# Patient Record
Sex: Female | Born: 1985 | Race: Black or African American | Hispanic: No | Marital: Single | Smoking: Current every day smoker
Health system: Southern US, Community
[De-identification: ages and names within clinical notes are randomized; demographics above are authoritative.]

## PROBLEM LIST (undated history)

## (undated) ENCOUNTER — Inpatient Hospital Stay: Payer: Self-pay

## (undated) ENCOUNTER — Inpatient Hospital Stay: Payer: Self-pay | Admitting: Obstetrics and Gynecology

## (undated) DIAGNOSIS — F1910 Other psychoactive substance abuse, uncomplicated: Secondary | ICD-10-CM

## (undated) DIAGNOSIS — Z62810 Personal history of physical and sexual abuse in childhood: Secondary | ICD-10-CM

## (undated) DIAGNOSIS — R87619 Unspecified abnormal cytological findings in specimens from cervix uteri: Secondary | ICD-10-CM

## (undated) DIAGNOSIS — N390 Urinary tract infection, site not specified: Secondary | ICD-10-CM

## (undated) DIAGNOSIS — F323 Major depressive disorder, single episode, severe with psychotic features: Secondary | ICD-10-CM

## (undated) DIAGNOSIS — F419 Anxiety disorder, unspecified: Secondary | ICD-10-CM

## (undated) DIAGNOSIS — F5083 Pica in adults: Secondary | ICD-10-CM

## (undated) DIAGNOSIS — D649 Anemia, unspecified: Secondary | ICD-10-CM

## (undated) DIAGNOSIS — F5089 Other specified eating disorder: Secondary | ICD-10-CM

## (undated) DIAGNOSIS — R440 Auditory hallucinations: Secondary | ICD-10-CM

## (undated) HISTORY — PX: ABDOMINAL SURGERY: SHX537

## (undated) HISTORY — DX: Personal history of physical and sexual abuse in childhood: Z62.810

---
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
## 2006-01-01 ENCOUNTER — Observation Stay: Payer: Self-pay | Admitting: Obstetrics and Gynecology

## 2006-01-02 ENCOUNTER — Emergency Department: Payer: Self-pay | Admitting: Emergency Medicine

## 2006-04-22 ENCOUNTER — Emergency Department: Payer: Self-pay | Admitting: Emergency Medicine

## 2006-05-06 ENCOUNTER — Emergency Department: Payer: Self-pay | Admitting: Emergency Medicine

## 2006-08-08 ENCOUNTER — Emergency Department: Payer: Self-pay | Admitting: Emergency Medicine

## 2006-08-08 ENCOUNTER — Other Ambulatory Visit: Payer: Self-pay

## 2006-12-29 ENCOUNTER — Ambulatory Visit: Payer: Self-pay | Admitting: Family Medicine

## 2007-03-10 ENCOUNTER — Emergency Department: Payer: Self-pay | Admitting: Emergency Medicine

## 2007-05-27 ENCOUNTER — Emergency Department: Payer: Self-pay | Admitting: Emergency Medicine

## 2007-07-16 ENCOUNTER — Emergency Department: Payer: Self-pay | Admitting: Emergency Medicine

## 2007-07-22 ENCOUNTER — Emergency Department: Payer: Self-pay | Admitting: Emergency Medicine

## 2007-08-04 ENCOUNTER — Emergency Department: Payer: Self-pay | Admitting: Emergency Medicine

## 2007-12-23 ENCOUNTER — Emergency Department: Payer: Self-pay | Admitting: Emergency Medicine

## 2008-01-15 ENCOUNTER — Emergency Department: Payer: Self-pay | Admitting: Emergency Medicine

## 2008-01-21 ENCOUNTER — Emergency Department: Payer: Self-pay | Admitting: Emergency Medicine

## 2008-03-20 ENCOUNTER — Emergency Department: Payer: Self-pay | Admitting: Emergency Medicine

## 2008-03-21 ENCOUNTER — Emergency Department: Payer: Self-pay | Admitting: Emergency Medicine

## 2008-04-20 ENCOUNTER — Encounter: Payer: Self-pay | Admitting: Maternal & Fetal Medicine

## 2008-05-09 ENCOUNTER — Encounter: Payer: Self-pay | Admitting: Pediatric Cardiology

## 2008-05-25 ENCOUNTER — Observation Stay: Payer: Self-pay | Admitting: Obstetrics and Gynecology

## 2008-06-05 ENCOUNTER — Observation Stay: Payer: Self-pay | Admitting: Obstetrics and Gynecology

## 2008-06-14 ENCOUNTER — Observation Stay: Payer: Self-pay | Admitting: Obstetrics and Gynecology

## 2008-06-20 ENCOUNTER — Observation Stay: Payer: Self-pay | Admitting: Obstetrics and Gynecology

## 2008-06-29 ENCOUNTER — Encounter: Payer: Self-pay | Admitting: Obstetrics and Gynecology

## 2008-07-13 ENCOUNTER — Observation Stay: Payer: Self-pay

## 2008-08-10 ENCOUNTER — Observation Stay: Payer: Self-pay

## 2008-08-25 ENCOUNTER — Observation Stay: Payer: Self-pay

## 2008-08-31 ENCOUNTER — Observation Stay: Payer: Self-pay | Admitting: Obstetrics and Gynecology

## 2008-09-02 ENCOUNTER — Emergency Department: Payer: Self-pay | Admitting: Emergency Medicine

## 2008-09-03 ENCOUNTER — Observation Stay: Payer: Self-pay | Admitting: Obstetrics and Gynecology

## 2008-09-24 ENCOUNTER — Emergency Department: Payer: Self-pay | Admitting: Internal Medicine

## 2009-01-01 ENCOUNTER — Emergency Department: Payer: Self-pay | Admitting: Unknown Physician Specialty

## 2009-01-13 ENCOUNTER — Emergency Department: Payer: Self-pay | Admitting: Emergency Medicine

## 2009-05-05 ENCOUNTER — Emergency Department: Payer: Self-pay | Admitting: Internal Medicine

## 2009-07-17 ENCOUNTER — Emergency Department: Payer: Self-pay | Admitting: Emergency Medicine

## 2009-07-22 ENCOUNTER — Emergency Department: Payer: Self-pay | Admitting: Internal Medicine

## 2010-04-08 ENCOUNTER — Inpatient Hospital Stay: Payer: Self-pay | Admitting: Unknown Physician Specialty

## 2010-05-05 DIAGNOSIS — R440 Auditory hallucinations: Secondary | ICD-10-CM

## 2010-05-05 DIAGNOSIS — F323 Major depressive disorder, single episode, severe with psychotic features: Secondary | ICD-10-CM

## 2010-05-05 HISTORY — DX: Major depressive disorder, single episode, severe with psychotic features: F32.3

## 2010-05-05 HISTORY — DX: Auditory hallucinations: R44.0

## 2010-08-06 ENCOUNTER — Emergency Department: Payer: Self-pay | Admitting: Unknown Physician Specialty

## 2010-09-18 ENCOUNTER — Emergency Department: Payer: Self-pay | Admitting: Emergency Medicine

## 2010-09-24 ENCOUNTER — Emergency Department: Payer: Self-pay | Admitting: Unknown Physician Specialty

## 2010-12-12 ENCOUNTER — Emergency Department: Payer: Self-pay | Admitting: Emergency Medicine

## 2011-01-13 ENCOUNTER — Emergency Department (HOSPITAL_COMMUNITY)
Admission: EM | Admit: 2011-01-13 | Discharge: 2011-01-13 | Disposition: A | Discharge status: Home / Self Care | Payer: Self-pay | Attending: Emergency Medicine | Admitting: Emergency Medicine

## 2011-01-13 ENCOUNTER — Encounter: Payer: Self-pay | Admitting: *Deleted

## 2011-01-13 ENCOUNTER — Emergency Department (HOSPITAL_COMMUNITY): Payer: Self-pay

## 2011-01-13 DIAGNOSIS — J069 Acute upper respiratory infection, unspecified: Secondary | ICD-10-CM | POA: Insufficient documentation

## 2011-01-13 DIAGNOSIS — Z331 Pregnant state, incidental: Secondary | ICD-10-CM | POA: Insufficient documentation

## 2011-01-13 MED ORDER — FOLIC ACID 1 MG PO TABS: 1.0000 mg | ORAL_TABLET | Freq: Every day | ORAL | Status: DC

## 2011-01-13 NOTE — ED Provider Notes (Signed)
Scribed for Ward Givens, MD, the patient was seen in room APA01/APA01 . This chart was scribed by Ellie Lunch. This patient's care was started at 21:22 PM.   CSN: [id number] Arrival date & time: 01/13/2011  8:09 PM  Chief Complaint  Patient presents with  . Fever   HPI [name] Young is a 25 y.o. female who presents to the Emergency Department complaining of a cold. Pt reports a nonproductive cough, congestion with green mucus discharge, and fever starting yesterday. Pt c/o associated nausea, SOB in supine position, central chest pain and sore throat when she coughs. Denies v/d. Pt denies sick contacts. Pt has not taken any medication for sx.   No pcp. No reported chronic conditions.  History reviewed. No pertinent past medical history.  Past Surgical History  Procedure Date  . Abdominal surgery     No family history on file.  History  Substance Use Topics  . Smoking status: Not on file  . Smokeless tobacco: Not on file  . Alcohol Use:      occassional   smoker and occassional drinking. Does not work.   Review of Systems  Constitutional: Positive for fever.  HENT: Positive for congestion and sore throat.   Respiratory: Positive for cough and shortness of breath (in supine positon). Negative for wheezing.   Cardiovascular: Positive for chest pain (when she coughs).  Gastrointestinal: Positive for nausea. Negative for vomiting and diarrhea.  All other systems reviewed and are negative.    Physical Exam  BP 120/74  Pulse 96  Temp(Src) 100.2 F (37.9 C) (Oral)  Resp 20  Ht 5' (1.524 m)  Wt 146 lb 3 oz (66.31 kg)  BMI 28.55 kg/m2  SpO2 100%  Physical Exam  Constitutional: She is oriented to person, place, and time. She appears well-developed and well-nourished.  HENT:  Head: Normocephalic and atraumatic.  Nose: Mucosal edema and rhinorrhea present.       Sounds congested  Eyes: Conjunctivae and EOM are normal. Pupils are equal, round, and reactive to light.    Neck: Normal range of motion. Neck supple.  Cardiovascular: Normal rate, regular rhythm and normal heart sounds.   Pulmonary/Chest: Breath sounds normal. No respiratory distress. She has no wheezes. She has no rales. She exhibits no tenderness.  Abdominal: Soft.  Musculoskeletal: Normal range of motion.  Neurological: She is alert and oriented to person, place, and time.  Skin: Skin is warm and dry.  Psychiatric: She has a normal mood and affect.   Procedures  OTHER DATA REVIEWED: Nursing notes, vital signs, and past medical records reviewed.  DIAGNOSTIC STUDIES: Oxygen Saturation is 100% on room air, normal by my interpretation.    LABS / RADIOLOGY:  Results for orders placed during the hospital encounter of 01/13/11  POCT PREGNANCY, URINE      Component Value Range   Preg Test, Ur NEGATIVE    PREGNANCY, URINE      Component Value Range   Preg Test, Ur POSITIVE     Dg Chest 2 View  01/13/2011  *RADIOLOGY REPORT*  Clinical Data: Cough and congestion.  CHEST - 2 VIEW  Comparison: None.  Findings: The lungs are well-aerated and clear.  There is no evidence of focal opacification, pleural effusion or pneumothorax.  The heart is normal in size; the mediastinal contour is within normal limits.  No acute osseous abnormalities are seen.  IMPRESSION: No acute cardiopulmonary process seen.  Original Report Authenticated By: Tonia Ghent, M.D.   ED COURSE / COORDINATION  OF CARE: Pt coming into the doctors office stating her ride is here and she needs to leave.    MDM:   MEDICATIONS GIVEN IN THE E.D. none  I personally performed the services described in this documentation, which was scribed in my presence. The recorded information has been reviewed and considered. Devoria Albe, MD, Armando Gang        Ward Givens, MD 01/14/11 [phone]

## 2011-01-13 NOTE — ED Notes (Signed)
Fever, nausea and vomiting. Thinks she may be pregant

## 2011-01-13 NOTE — ED Notes (Signed)
*  Correction in POCT Urine Pregnancy*  TEST POSITIVE URINE BEING SENT TO LAB FOR PREGNANCY CONFIRMATION.

## 2011-01-14 ENCOUNTER — Encounter (HOSPITAL_COMMUNITY): Payer: Self-pay | Admitting: Emergency Medicine

## 2011-01-14 ENCOUNTER — Emergency Department (HOSPITAL_COMMUNITY)
Admission: EM | Admit: 2011-01-14 | Discharge: 2011-01-14 | Discharge status: Against Medical Advice | Payer: Self-pay | Attending: Emergency Medicine | Admitting: Emergency Medicine

## 2011-01-14 DIAGNOSIS — R109 Unspecified abdominal pain: Secondary | ICD-10-CM | POA: Insufficient documentation

## 2011-01-14 DIAGNOSIS — Z5320 Procedure and treatment not carried out because of patient's decision for unspecified reasons: Secondary | ICD-10-CM | POA: Insufficient documentation

## 2011-01-14 NOTE — ED Notes (Signed)
Pt c/o abd cramping with n/hematuria/dysuria. Denies v/d.

## 2011-03-24 ENCOUNTER — Inpatient Hospital Stay: Payer: Self-pay | Admitting: Psychiatry

## 2011-04-07 ENCOUNTER — Emergency Department: Payer: Self-pay | Admitting: Emergency Medicine

## 2011-04-22 ENCOUNTER — Encounter (HOSPITAL_COMMUNITY): Payer: Self-pay | Admitting: *Deleted

## 2011-04-22 ENCOUNTER — Emergency Department (HOSPITAL_COMMUNITY): Payer: Medicaid Other

## 2011-04-22 ENCOUNTER — Emergency Department (HOSPITAL_COMMUNITY)
Admission: EM | Admit: 2011-04-22 | Discharge: 2011-04-22 | Discharge status: Against Medical Advice | Payer: Medicaid Other | Attending: Emergency Medicine | Admitting: Emergency Medicine

## 2011-04-22 DIAGNOSIS — O99891 Other specified diseases and conditions complicating pregnancy: Secondary | ICD-10-CM | POA: Insufficient documentation

## 2011-04-22 DIAGNOSIS — F17200 Nicotine dependence, unspecified, uncomplicated: Secondary | ICD-10-CM | POA: Insufficient documentation

## 2011-04-22 DIAGNOSIS — O26899 Other specified pregnancy related conditions, unspecified trimester: Secondary | ICD-10-CM

## 2011-04-22 DIAGNOSIS — R102 Pelvic and perineal pain: Secondary | ICD-10-CM

## 2011-04-22 DIAGNOSIS — N949 Unspecified condition associated with female genital organs and menstrual cycle: Secondary | ICD-10-CM | POA: Insufficient documentation

## 2011-04-22 DIAGNOSIS — R109 Unspecified abdominal pain: Secondary | ICD-10-CM | POA: Insufficient documentation

## 2011-04-22 LAB — DIFFERENTIAL
Eosinophils Absolute: 0.1 10*3/uL (ref 0.0–0.7)
Eosinophils Relative: 1 % (ref 0–5)
Lymphs Abs: 2.5 10*3/uL (ref 0.7–4.0)
Monocytes Absolute: 0.5 10*3/uL (ref 0.1–1.0)
Monocytes Relative: 5 % (ref 3–12)

## 2011-04-22 LAB — BASIC METABOLIC PANEL
BUN: 8 mg/dL (ref 6–23)
Calcium: 9.8 mg/dL (ref 8.4–10.5)
GFR calc non Af Amer: >90 mL/min (ref >90)
Glucose, Bld: 87 mg/dL (ref 70–99)

## 2011-04-22 LAB — CBC
HCT: 30 % — ABNORMAL LOW (ref 36.0–46.0)
Hemoglobin: 10.2 g/dL — ABNORMAL LOW (ref 12.0–15.0)
MCH: 28.5 pg (ref 26.0–34.0)
MCV: 83.8 fL (ref 78.0–100.0)
Platelets: 215 10*3/uL (ref 150–400)
RBC: 3.58 MIL/uL — ABNORMAL LOW (ref 3.87–5.11)

## 2011-04-22 NOTE — ED Provider Notes (Signed)
History     CSN: [id number] Arrival date & time: 04/22/2011 12:16 PM   First MD Initiated Contact with Patient 04/22/11 1306      Chief Complaint  Patient presents with  . Abdominal Pain   HPI Pt was seen at 1320.  Per pt, c/o gradual onset and persistence of constant lower abd "pain" since yesterday.  Describes the pain as "cramping."  States she called her doctor and was told to go to the ED for eval.  Pt G1, P0, approx EGA [redacted] weeks.  Denies vaginal bleeding, no LOF, no N/V/D, no back pain, no fevers.     PMD:  [facility]. History reviewed. No pertinent past medical history.  Past Surgical History  Procedure Date  . Abdominal surgery     History  Substance Use Topics  . Smoking status: Current Everyday Smoker    Types: Cigarettes  . Smokeless tobacco: Not on file  . Alcohol Use: No     occassional     OB History    Grav Para Term Preterm Abortions TAB SAB Ect Mult Living   1               Review of Systems ROS: Statement: All systems negative except as marked or noted in the HPI; Constitutional: Negative for fever and chills. ; ; Eyes: Negative for eye pain, redness and discharge. ; ; ENMT: Negative for ear pain, hoarseness, nasal congestion, sinus pressure and sore throat. ; ; Cardiovascular: Negative for chest pain, palpitations, diaphoresis, dyspnea and peripheral edema. ; ; Respiratory: Negative for cough, wheezing and stridor. ; ; Gastrointestinal: +abd pain. Negative for nausea, vomiting, diarrhea, blood in stool, hematemesis, jaundice and rectal bleeding. . ; ; Genitourinary: Negative for dysuria, flank pain and hematuria.  GYN:  No vaginal bleeding, no vaginal discharge, no vulvar pain.; ; Musculoskeletal: Negative for back pain and neck pain. Negative for swelling and trauma.; ; Skin: Negative for pruritus, rash, abrasions, blisters, bruising and skin lesion.; ; Neuro: Negative for headache, lightheadedness and neck stiffness. Negative for weakness,  altered level of consciousness , altered mental status, extremity weakness, paresthesias, involuntary movement, seizure and syncope.       Allergies  Review of patient's allergies indicates no known allergies.  Home Medications  No current outpatient prescriptions on file.  BP 105/61  Pulse 87  Temp(Src) 98.5 F (36.9 C) (Oral)  Resp 16  Ht 5' (1.524 m)  Wt 144 lb 4 oz (65.431 kg)  BMI 28.17 kg/m2  SpO2 100%  LMP 10/13/2010  Physical Exam 1325: Physical examination:  Nursing notes reviewed; Vital signs and O2 SAT reviewed;  Constitutional: Well developed, Well nourished, Well hydrated, In no acute distress; Head:  Normocephalic, atraumatic; Eyes: EOMI, PERRL, No scleral icterus; ENMT: Mouth and pharynx normal, Mucous membranes moist; Neck: Supple, Full range of motion, No lymphadenopathy; Cardiovascular: Regular rate and rhythm, No murmur, rub, or gallop; Respiratory: Breath sounds clear & equal bilaterally, No rales, rhonchi, wheezes, or rub, Normal respiratory effort/excursion; Chest: Nontender, Movement normal; Abdomen: Soft, Nontender, Nondistended, Normal bowel sounds; Genitourinary: No CVA tenderness; Extremities: Pulses normal, No tenderness, No edema, No calf edema or asymmetry.; Neuro: AA&Ox3, Major CN grossly intact.  No gross focal motor or sensory deficits in extremities.; Skin: Color normal, Warm, Dry, no rash.   ED Course  Procedures   1335:  FHT by ED RN 150's.  Pt sitting up in stretcher, watching TV, eating chips/drinking soda, talking with friend/family at bedside.  I informed  pt that I would be getting a female ED Tech to help [place] perform her pelvic exam to check for any vaginal infection, pt verb understanding and I left the room.  Per the ED RN, after I walked out of the room pt stated her ride was here and she had to leave, left AMA.  [place] tech informed ED RN that pt's question upon arriving to [place] room was "can I have a picture?" and "is it a boy or girl?"      MDM   MDM Reviewed: nursing note, previous chart and vitals Interpretation: labs and ultrasound   Results for orders placed during the hospital encounter of 04/22/11  CBC      Component Value Range   WBC 10.5  4.0 - 10.5 (K/uL)   RBC 3.58 (*) 3.87 - 5.11 (MIL/uL)   Hemoglobin 10.2 (*) 12.0 - 15.0 (g/dL)   HCT 16.1 (*) 09.6 - 46.0 (%)   MCV 83.8  78.0 - 100.0 (fL)   MCH 28.5  26.0 - 34.0 (pg)   MCHC 34.0  30.0 - 36.0 (g/dL)   RDW 04.5  40.9 - 81.1 (%)   Platelets 215  150 - 400 (K/uL)  DIFFERENTIAL      Component Value Range   Neutrophils Relative 70  43 - 77 (%)   Neutro Abs 7.4  1.7 - 7.7 (K/uL)   Lymphocytes Relative 24  12 - 46 (%)   Lymphs Abs 2.5  0.7 - 4.0 (K/uL)   Monocytes Relative 5  3 - 12 (%)   Monocytes Absolute 0.5  0.1 - 1.0 (K/uL)   Eosinophils Relative 1  0 - 5 (%)   Eosinophils Absolute 0.1  0.0 - 0.7 (K/uL)   Basophils Relative 0  0 - 1 (%)   Basophils Absolute 0.0  0.0 - 0.1 (K/uL)  BASIC METABOLIC PANEL      Component Value Range   Sodium 136  135 - 145 (mEq/L)   Potassium 3.9  3.5 - 5.1 (mEq/L)   Chloride 104  96 - 112 (mEq/L)   CO2 24  19 - 32 (mEq/L)   Glucose, Bld 87  70 - 99 (mg/dL)   BUN 8  6 - 23 (mg/dL)   Creatinine, Ser 9.14  0.50 - 1.10 (mg/dL)   Calcium 9.8  8.4 - 78.2 (mg/dL)   GFR calc non Af Amer >90  >90 (mL/min)   GFR calc Af Amer >90  >90 (mL/min)  PROTIME-INR      Component Value Range   Prothrombin Time 13.2  11.6 - 15.2 (seconds)   INR 0.98  0.00 - 1.49   HCG, QUANTITATIVE, PREGNANCY      Component Value Range   hCG, Beta Chain, Quant, S 12179 (*) <5 (mIU/mL)  ABO/RH      Component Value Range   ABO/RH(D) O POS     US Ob Comp + 14 Wk  04/22/2011  *RADIOLOGY REPORT*  Clinical Data: Pain, cramping; assigned gestational age is 18 weeks 2 days  LIMITED OBSTETRIC ULTRASOUND  Number of Fetuses: 1 Heart Rate: 160 bpm Movement: Yes Presentation: Variable Placental Location: Anterior Previa: No Amniotic Fluid (Subjective): Normal  No  marginal or retroplacental bleeding is seen.  BPD: 4.1cm   18w   3d  MATERNAL FINDINGS: Cervix: Closed/  IMPRESSION: There is a single living intrauterine gestation with concordant gestational age.  No placental abruption or previa are identified. The amniotic fluid volume is normal.  Recommend followup with non-emergent complete  OB 14+ wk US examination for fetal biometric evaluation and anatomic survey. This could be performed at the [facility] of [place].  Original Report Authenticated By: Brandon Melnick, M.D.         Samuel Jester Allison Quarry, DO 04/23/11 1130

## 2011-04-22 NOTE — ED Notes (Signed)
Pt c/o lower abdominal pain and vaginal bleeding since yesterday. Pt states that she is [redacted] weeks pregnant. Pt states that she has not felt the baby move since yesterday.

## 2011-04-22 NOTE — ED Notes (Signed)
Pt wanting to sign out AMA states he does not have a ride and has to leave now.  AMA form signed.

## 2011-04-22 NOTE — ED Notes (Signed)
Pt states sharp pain started yesterday in abdomen. She called her OB/GYN today and was told to come to ED. Pt rates pain at 10 out of 10. Pt is showing no signs of distress. Denies vaginal bleeding. Fetal heart tones obtained by external doppler at 150 bpm.

## 2011-05-01 ENCOUNTER — Encounter: Payer: Self-pay | Admitting: Obstetrics & Gynecology

## 2011-06-14 ENCOUNTER — Observation Stay: Payer: Self-pay | Admitting: Obstetrics and Gynecology

## 2011-06-14 LAB — URINALYSIS, COMPLETE
Bilirubin,UR: NEGATIVE
Blood: NEGATIVE
Glucose,UR: NEGATIVE mg/dL (ref 0–75)
Ketone: NEGATIVE
Nitrite: NEGATIVE
Specific Gravity: 1.016 (ref 1.003–1.030)
Squamous Epithelial: 1

## 2011-06-14 LAB — FETAL FIBRONECTIN
Appearance: NORMAL
Fetal Fibronectin: NEGATIVE

## 2011-06-16 LAB — URINE CULTURE

## 2011-07-10 ENCOUNTER — Encounter: Payer: Self-pay | Admitting: Maternal & Fetal Medicine

## 2011-07-25 ENCOUNTER — Observation Stay: Payer: Self-pay | Admitting: Obstetrics & Gynecology

## 2011-07-25 ENCOUNTER — Inpatient Hospital Stay: Payer: Self-pay | Admitting: Psychiatry

## 2011-07-25 LAB — COMPREHENSIVE METABOLIC PANEL
Anion Gap: 9 (ref 7–16)
BUN: 9 mg/dL (ref 7–18)
Calcium, Total: 8.4 mg/dL — ABNORMAL LOW (ref 8.5–10.1)
Chloride: 105 mmol/L (ref 98–107)
Co2: 22 mmol/L (ref 21–32)
EGFR (African American): >60
EGFR (Non-African Amer.): >60
Potassium: 3.2 mmol/L — ABNORMAL LOW (ref 3.5–5.1)
SGOT(AST): 14 U/L — ABNORMAL LOW (ref 15–37)
SGPT (ALT): 14 U/L
Sodium: 136 mmol/L (ref 136–145)
Total Protein: 6.9 g/dL (ref 6.4–8.2)

## 2011-07-25 LAB — CBC WITH DIFFERENTIAL/PLATELET
Basophil #: 0.1 10*3/uL (ref 0.0–0.1)
Eosinophil #: 0.1 10*3/uL (ref 0.0–0.7)
HCT: 28.2 % — ABNORMAL LOW (ref 35.0–47.0)
Lymphocyte #: 2.6 10*3/uL (ref 1.0–3.6)
Lymphocyte %: 19.7 %
MCHC: 33.4 g/dL (ref 32.0–36.0)
MCV: 86 fL (ref 80–100)
Monocyte #: 0.7 10*3/uL (ref 0.0–0.7)
Monocyte %: 5 %
Neutrophil #: 9.8 10*3/uL — ABNORMAL HIGH (ref 1.4–6.5)
Neutrophil %: 74.2 %
Platelet: 181 10*3/uL (ref 150–440)
RBC: 3.3 10*6/uL — ABNORMAL LOW (ref 3.80–5.20)
RDW: 13.8 % (ref 11.5–14.5)
WBC: 13.2 10*3/uL — ABNORMAL HIGH (ref 3.6–11.0)

## 2011-07-25 LAB — URINALYSIS, COMPLETE
Blood: NEGATIVE
Glucose,UR: NEGATIVE mg/dL (ref 0–75)
Leukocyte Esterase: NEGATIVE
Nitrite: NEGATIVE
Ph: 5 (ref 4.5–8.0)
RBC,UR: NONE SEEN /HPF (ref 0–5)
WBC UR: 2 /HPF (ref 0–5)

## 2011-07-25 LAB — DRUG SCREEN, URINE
Barbiturates, Ur Screen: NEGATIVE (ref ?–200)
Benzodiazepine, Ur Scrn: NEGATIVE (ref ?–200)
Cannabinoid 50 Ng, Ur ~~LOC~~: NEGATIVE (ref ?–50)
Cocaine Metabolite,Ur ~~LOC~~: NEGATIVE (ref ?–300)
MDMA (Ecstasy)Ur Screen: NEGATIVE (ref ?–500)
Methadone, Ur Screen: NEGATIVE (ref ?–300)
Opiate, Ur Screen: NEGATIVE (ref ?–300)
Phencyclidine (PCP) Ur S: NEGATIVE (ref ?–25)
Tricyclic, Ur Screen: NEGATIVE (ref ?–1000)

## 2011-07-25 LAB — ETHANOL
Ethanol %: <0.003 % (ref 0.000–0.080)
Ethanol: <3 mg/dL

## 2011-07-25 LAB — FOLATE: Folic Acid: 23.2 ng/mL (ref 3.1–100.0)

## 2011-07-25 LAB — TSH: Thyroid Stimulating Horm: 1.26 u[IU]/mL

## 2011-07-26 ENCOUNTER — Inpatient Hospital Stay: Payer: Self-pay | Admitting: Psychiatry

## 2011-07-26 ENCOUNTER — Observation Stay: Payer: Self-pay | Admitting: Obstetrics and Gynecology

## 2011-07-26 LAB — LIPID PANEL
Cholesterol: 200 mg/dL (ref 0–200)
HDL Cholesterol: 52 mg/dL (ref 40–60)
Triglycerides: 169 mg/dL (ref 0–200)
VLDL Cholesterol, Calc: 34 mg/dL (ref 5–40)

## 2011-07-26 LAB — WBC: WBC: 10.1 10*3/uL (ref 3.6–11.0)

## 2011-07-27 ENCOUNTER — Inpatient Hospital Stay: Payer: Self-pay | Admitting: Psychiatry

## 2011-07-27 ENCOUNTER — Observation Stay: Payer: Self-pay

## 2011-08-01 ENCOUNTER — Inpatient Hospital Stay: Payer: Self-pay | Admitting: Psychiatry

## 2011-08-01 ENCOUNTER — Observation Stay: Payer: Self-pay | Admitting: Obstetrics and Gynecology

## 2011-08-20 ENCOUNTER — Emergency Department: Payer: Self-pay | Admitting: Emergency Medicine

## 2011-08-20 LAB — DRUG SCREEN, URINE
Amphetamines, Ur Screen: NEGATIVE (ref ?–1000)
Barbiturates, Ur Screen: NEGATIVE (ref ?–200)
Benzodiazepine, Ur Scrn: NEGATIVE (ref ?–200)
Cocaine Metabolite,Ur ~~LOC~~: POSITIVE (ref ?–300)
Methadone, Ur Screen: NEGATIVE (ref ?–300)
Opiate, Ur Screen: NEGATIVE (ref ?–300)
Phencyclidine (PCP) Ur S: NEGATIVE (ref ?–25)
Tricyclic, Ur Screen: NEGATIVE (ref ?–1000)

## 2011-08-20 LAB — URINALYSIS, COMPLETE
Bilirubin,UR: NEGATIVE
Glucose,UR: NEGATIVE mg/dL (ref 0–75)
Leukocyte Esterase: NEGATIVE
Protein: 30
RBC,UR: 2 /HPF (ref 0–5)
Squamous Epithelial: 2
WBC UR: 1 /HPF (ref 0–5)

## 2011-08-20 LAB — COMPREHENSIVE METABOLIC PANEL
Albumin: 2.9 g/dL — ABNORMAL LOW (ref 3.4–5.0)
BUN: 6 mg/dL — ABNORMAL LOW (ref 7–18)
Bilirubin,Total: 0.2 mg/dL (ref 0.2–1.0)
Creatinine: 0.59 mg/dL — ABNORMAL LOW (ref 0.60–1.30)
EGFR (African American): >60
EGFR (Non-African Amer.): >60
Glucose: 78 mg/dL (ref 65–99)
SGOT(AST): 19 U/L (ref 15–37)
Sodium: 140 mmol/L (ref 136–145)

## 2011-08-20 LAB — CBC
MCH: 27.3 pg (ref 26.0–34.0)
MCHC: 32.1 g/dL (ref 32.0–36.0)
RBC: 3.26 10*6/uL — ABNORMAL LOW (ref 3.80–5.20)
RDW: 14.5 % (ref 11.5–14.5)

## 2011-08-20 LAB — TSH: Thyroid Stimulating Horm: 1.07 u[IU]/mL

## 2011-08-20 LAB — ETHANOL: Ethanol %: <0.003 % (ref 0.000–0.080)

## 2011-11-04 ENCOUNTER — Emergency Department: Payer: Self-pay | Admitting: Emergency Medicine

## 2011-11-04 LAB — TSH: Thyroid Stimulating Horm: 1.73 u[IU]/mL

## 2011-11-04 LAB — URINALYSIS, COMPLETE
Bilirubin,UR: NEGATIVE
Blood: NEGATIVE
Glucose,UR: NEGATIVE mg/dL (ref 0–75)
Ketone: NEGATIVE
Leukocyte Esterase: NEGATIVE
Nitrite: NEGATIVE
RBC,UR: 4 /HPF (ref 0–5)
Specific Gravity: 1.018 (ref 1.003–1.030)

## 2011-11-04 LAB — CBC
MCV: 82 fL (ref 80–100)
Platelet: 292 10*3/uL (ref 150–440)
RDW: 16.3 % — ABNORMAL HIGH (ref 11.5–14.5)
WBC: 12.2 10*3/uL — ABNORMAL HIGH (ref 3.6–11.0)

## 2011-11-04 LAB — DRUG SCREEN, URINE
Amphetamines, Ur Screen: NEGATIVE (ref ?–1000)
Benzodiazepine, Ur Scrn: NEGATIVE (ref ?–200)
MDMA (Ecstasy)Ur Screen: NEGATIVE (ref ?–500)
Tricyclic, Ur Screen: NEGATIVE (ref ?–1000)

## 2011-11-04 LAB — ETHANOL
Ethanol %: 0.177 % — ABNORMAL HIGH (ref 0.000–0.080)
Ethanol: 177 mg/dL

## 2011-11-04 LAB — COMPREHENSIVE METABOLIC PANEL
Albumin: 4.1 g/dL (ref 3.4–5.0)
Alkaline Phosphatase: 117 U/L (ref 50–136)
BUN: 18 mg/dL (ref 7–18)
Calcium, Total: 8.7 mg/dL (ref 8.5–10.1)
Glucose: 88 mg/dL (ref 65–99)
Osmolality: 283 (ref 275–301)
Sodium: 141 mmol/L (ref 136–145)
Total Protein: 8.3 g/dL — ABNORMAL HIGH (ref 6.4–8.2)

## 2012-12-13 ENCOUNTER — Emergency Department: Payer: Self-pay | Admitting: Unknown Physician Specialty

## 2012-12-13 LAB — COMPREHENSIVE METABOLIC PANEL
Alkaline Phosphatase: 103 U/L (ref 50–136)
Creatinine: 0.96 mg/dL (ref 0.60–1.30)
EGFR (African American): >60
EGFR (Non-African Amer.): >60
Osmolality: 280 (ref 275–301)
SGOT(AST): 16 U/L (ref 15–37)
SGPT (ALT): 19 U/L (ref 12–78)
Sodium: 138 mmol/L (ref 136–145)

## 2012-12-13 LAB — URINALYSIS, COMPLETE
Bilirubin,UR: NEGATIVE
Blood: NEGATIVE
Glucose,UR: NEGATIVE mg/dL (ref 0–75)
Leukocyte Esterase: NEGATIVE
Ph: 5 (ref 4.5–8.0)
Protein: NEGATIVE
Specific Gravity: 1.028 (ref 1.003–1.030)

## 2012-12-13 LAB — DRUG SCREEN, URINE
Amphetamines, Ur Screen: NEGATIVE (ref ?–1000)
Barbiturates, Ur Screen: NEGATIVE (ref ?–200)
Cannabinoid 50 Ng, Ur ~~LOC~~: NEGATIVE (ref ?–50)
Cocaine Metabolite,Ur ~~LOC~~: NEGATIVE (ref ?–300)
MDMA (Ecstasy)Ur Screen: NEGATIVE (ref ?–500)
Opiate, Ur Screen: NEGATIVE (ref ?–300)
Phencyclidine (PCP) Ur S: NEGATIVE (ref ?–25)
Tricyclic, Ur Screen: NEGATIVE (ref ?–1000)

## 2012-12-13 LAB — CBC
HCT: 34 % — ABNORMAL LOW (ref 35.0–47.0)
MCHC: 33.4 g/dL (ref 32.0–36.0)
Platelet: 194 10*3/uL (ref 150–440)
RBC: 4.28 10*6/uL (ref 3.80–5.20)
RDW: 15.3 % — ABNORMAL HIGH (ref 11.5–14.5)

## 2012-12-13 LAB — HCG, QUANTITATIVE, PREGNANCY: Beta Hcg, Quant.: <1 m[IU]/mL — ABNORMAL LOW

## 2013-03-13 ENCOUNTER — Emergency Department: Payer: Self-pay | Admitting: Emergency Medicine

## 2013-03-13 LAB — CBC
HCT: 36.3 % (ref 35.0–47.0)
MCH: 26.6 pg (ref 26.0–34.0)
MCHC: 33.3 g/dL (ref 32.0–36.0)
MCV: 80 fL (ref 80–100)
RBC: 4.54 10*6/uL (ref 3.80–5.20)
WBC: 11.3 10*3/uL — ABNORMAL HIGH (ref 3.6–11.0)

## 2013-03-13 LAB — URINALYSIS, COMPLETE
Bilirubin,UR: NEGATIVE
Blood: NEGATIVE
Leukocyte Esterase: NEGATIVE
Nitrite: NEGATIVE
Ph: 7 (ref 4.5–8.0)
Protein: NEGATIVE
Specific Gravity: 1.017 (ref 1.003–1.030)
Squamous Epithelial: 1
WBC UR: 1 /HPF (ref 0–5)

## 2013-03-13 LAB — ETHANOL: Ethanol: <3 mg/dL

## 2013-03-13 LAB — COMPREHENSIVE METABOLIC PANEL
Albumin: 3.7 g/dL (ref 3.4–5.0)
Alkaline Phosphatase: 109 U/L (ref 50–136)
BUN: 18 mg/dL (ref 7–18)
Creatinine: 1.03 mg/dL (ref 0.60–1.30)
EGFR (Non-African Amer.): >60
Glucose: 77 mg/dL (ref 65–99)
SGOT(AST): 22 U/L (ref 15–37)
Total Protein: 7.7 g/dL (ref 6.4–8.2)

## 2013-03-13 LAB — DRUG SCREEN, URINE
Amphetamines, Ur Screen: NEGATIVE (ref ?–1000)
Barbiturates, Ur Screen: NEGATIVE (ref ?–200)
Benzodiazepine, Ur Scrn: NEGATIVE (ref ?–200)
Cocaine Metabolite,Ur ~~LOC~~: POSITIVE (ref ?–300)
Opiate, Ur Screen: NEGATIVE (ref ?–300)
Tricyclic, Ur Screen: NEGATIVE (ref ?–1000)

## 2013-04-21 ENCOUNTER — Emergency Department: Payer: Self-pay | Admitting: Emergency Medicine

## 2013-04-21 LAB — URINALYSIS, COMPLETE
Glucose,UR: NEGATIVE mg/dL (ref 0–75)
Ketone: NEGATIVE
Protein: NEGATIVE
RBC,UR: 1 /HPF (ref 0–5)
Specific Gravity: 1.02 (ref 1.003–1.030)
Squamous Epithelial: 1

## 2013-04-21 LAB — GC/CHLAMYDIA PROBE AMP

## 2013-04-21 LAB — WET PREP, GENITAL

## 2013-07-27 ENCOUNTER — Emergency Department: Payer: Self-pay | Admitting: Emergency Medicine

## 2013-07-27 LAB — CBC WITH DIFFERENTIAL/PLATELET
BASOS ABS: 0 10*3/uL (ref 0.0–0.1)
BASOS PCT: 0.6 %
EOS PCT: 0.9 %
Eosinophil #: 0.1 10*3/uL (ref 0.0–0.7)
HCT: 39.1 % (ref 35.0–47.0)
HGB: 12.6 g/dL (ref 12.0–16.0)
LYMPHS ABS: 2.5 10*3/uL (ref 1.0–3.6)
LYMPHS PCT: 31.5 %
MCH: 27.3 pg (ref 26.0–34.0)
MCHC: 32.4 g/dL (ref 32.0–36.0)
MCV: 84 fL (ref 80–100)
MONOS PCT: 6.8 %
Monocyte #: 0.5 x10 3/mm (ref 0.2–0.9)
NEUTROS PCT: 60.2 %
Neutrophil #: 4.8 10*3/uL (ref 1.4–6.5)
Platelet: 233 10*3/uL (ref 150–440)
RBC: 4.64 10*6/uL (ref 3.80–5.20)
RDW: 15.3 % — ABNORMAL HIGH (ref 11.5–14.5)
WBC: 7.9 10*3/uL (ref 3.6–11.0)

## 2013-07-27 LAB — URINALYSIS, COMPLETE
BILIRUBIN, UR: NEGATIVE
Bacteria: NONE SEEN
Blood: NEGATIVE
Glucose,UR: NEGATIVE mg/dL (ref 0–75)
Ketone: NEGATIVE
Leukocyte Esterase: NEGATIVE
Nitrite: NEGATIVE
Ph: 6 (ref 4.5–8.0)
Protein: NEGATIVE
RBC, UR: NONE SEEN /HPF (ref 0–5)
Specific Gravity: 1.017 (ref 1.003–1.030)
Squamous Epithelial: 6
WBC UR: <1 /HPF (ref 0–5)

## 2013-07-27 LAB — COMPREHENSIVE METABOLIC PANEL
ALBUMIN: 3.8 g/dL (ref 3.4–5.0)
ANION GAP: 5 — AB (ref 7–16)
Alkaline Phosphatase: 98 U/L
BILIRUBIN TOTAL: 0.2 mg/dL (ref 0.2–1.0)
BUN: 14 mg/dL (ref 7–18)
CALCIUM: 9.5 mg/dL (ref 8.5–10.1)
CHLORIDE: 104 mmol/L (ref 98–107)
CO2: 28 mmol/L (ref 21–32)
Creatinine: 0.99 mg/dL (ref 0.60–1.30)
EGFR (African American): >60
GLUCOSE: 91 mg/dL (ref 65–99)
OSMOLALITY: 274 (ref 275–301)
POTASSIUM: 4 mmol/L (ref 3.5–5.1)
SGOT(AST): 9 U/L — ABNORMAL LOW (ref 15–37)
SGPT (ALT): 14 U/L (ref 12–78)
Sodium: 137 mmol/L (ref 136–145)
TOTAL PROTEIN: 8 g/dL (ref 6.4–8.2)

## 2013-07-27 LAB — LIPASE, BLOOD: Lipase: 191 U/L (ref 73–393)

## 2013-10-08 ENCOUNTER — Emergency Department: Payer: Self-pay | Admitting: Emergency Medicine

## 2014-01-09 ENCOUNTER — Emergency Department: Payer: Self-pay | Admitting: Emergency Medicine

## 2014-01-09 LAB — URINALYSIS, COMPLETE
BILIRUBIN, UR: NEGATIVE
Glucose,UR: NEGATIVE mg/dL (ref 0–75)
Ketone: NEGATIVE
Leukocyte Esterase: NEGATIVE
NITRITE: POSITIVE
PROTEIN: NEGATIVE
Ph: 5 (ref 4.5–8.0)
Specific Gravity: 1.009 (ref 1.003–1.030)
WBC UR: 3 /HPF (ref 0–5)

## 2014-01-09 LAB — CBC WITH DIFFERENTIAL/PLATELET
Basophil #: 0.1 10*3/uL (ref 0.0–0.1)
Basophil %: 0.7 %
Eosinophil #: 0.1 10*3/uL (ref 0.0–0.7)
Eosinophil %: 0.9 %
HCT: 40.7 % (ref 35.0–47.0)
HGB: 13.2 g/dL (ref 12.0–16.0)
Lymphocyte #: 3.7 10*3/uL — ABNORMAL HIGH (ref 1.0–3.6)
Lymphocyte %: 37.3 %
MCH: 28 pg (ref 26.0–34.0)
MCHC: 32.4 g/dL (ref 32.0–36.0)
MCV: 87 fL (ref 80–100)
Monocyte #: 0.5 x10 3/mm (ref 0.2–0.9)
Monocyte %: 5.5 %
NEUTROS PCT: 55.6 %
Neutrophil #: 5.6 10*3/uL (ref 1.4–6.5)
Platelet: 251 10*3/uL (ref 150–440)
RBC: 4.7 10*6/uL (ref 3.80–5.20)
RDW: 13.8 % (ref 11.5–14.5)
WBC: 10 10*3/uL (ref 3.6–11.0)

## 2014-01-09 LAB — COMPREHENSIVE METABOLIC PANEL
ALK PHOS: 92 U/L
Albumin: 4 g/dL (ref 3.4–5.0)
Anion Gap: 5 — ABNORMAL LOW (ref 7–16)
BUN: 18 mg/dL (ref 7–18)
Bilirubin,Total: 0.2 mg/dL (ref 0.2–1.0)
Calcium, Total: 9.2 mg/dL (ref 8.5–10.1)
Chloride: 107 mmol/L (ref 98–107)
Co2: 25 mmol/L (ref 21–32)
Creatinine: 1.18 mg/dL (ref 0.60–1.30)
EGFR (African American): >60
EGFR (Non-African Amer.): >60
Glucose: 74 mg/dL (ref 65–99)
OSMOLALITY: 274 (ref 275–301)
Potassium: 3.8 mmol/L (ref 3.5–5.1)
SGOT(AST): 16 U/L (ref 15–37)
SGPT (ALT): 20 U/L
Sodium: 137 mmol/L (ref 136–145)
Total Protein: 8.3 g/dL — ABNORMAL HIGH (ref 6.4–8.2)

## 2014-01-09 LAB — LIPASE, BLOOD: Lipase: 203 U/L (ref 73–393)

## 2014-01-11 LAB — URINE CULTURE

## 2014-02-08 ENCOUNTER — Emergency Department: Payer: Self-pay | Admitting: Emergency Medicine

## 2014-03-06 ENCOUNTER — Encounter (HOSPITAL_COMMUNITY): Payer: Self-pay | Admitting: *Deleted

## 2014-03-10 ENCOUNTER — Emergency Department: Payer: Self-pay | Admitting: Emergency Medicine

## 2014-03-10 LAB — URINALYSIS, COMPLETE
BACTERIA: NONE SEEN
BLOOD: NEGATIVE
Bilirubin,UR: NEGATIVE
GLUCOSE, UR: NEGATIVE mg/dL (ref 0–75)
Ketone: NEGATIVE
Leukocyte Esterase: NEGATIVE
NITRITE: NEGATIVE
PH: 6 (ref 4.5–8.0)
Protein: NEGATIVE
RBC,UR: 1 /HPF (ref 0–5)
Specific Gravity: 1.026 (ref 1.003–1.030)

## 2014-03-10 LAB — BASIC METABOLIC PANEL
Anion Gap: 7 (ref 7–16)
BUN: 9 mg/dL (ref 7–18)
CO2: 24 mmol/L (ref 21–32)
Calcium, Total: 8.6 mg/dL (ref 8.5–10.1)
Chloride: 105 mmol/L (ref 98–107)
Creatinine: 0.83 mg/dL (ref 0.60–1.30)
EGFR (African American): >60
GLUCOSE: 81 mg/dL (ref 65–99)
Osmolality: 270 (ref 275–301)
Potassium: 3.7 mmol/L (ref 3.5–5.1)
SODIUM: 136 mmol/L (ref 136–145)

## 2014-03-10 LAB — CBC
HCT: 35.1 % (ref 35.0–47.0)
HGB: 11.3 g/dL — AB (ref 12.0–16.0)
MCH: 27.9 pg (ref 26.0–34.0)
MCHC: 32.1 g/dL (ref 32.0–36.0)
MCV: 87 fL (ref 80–100)
Platelet: 187 10*3/uL (ref 150–440)
RBC: 4.05 10*6/uL (ref 3.80–5.20)
RDW: 13.1 % (ref 11.5–14.5)
WBC: 7.4 10*3/uL (ref 3.6–11.0)

## 2014-03-10 LAB — DRUG SCREEN, URINE
Amphetamines, Ur Screen: NEGATIVE (ref ?–1000)
BARBITURATES, UR SCREEN: NEGATIVE (ref ?–200)
Benzodiazepine, Ur Scrn: NEGATIVE (ref ?–200)
Cannabinoid 50 Ng, Ur ~~LOC~~: NEGATIVE (ref ?–50)
Cocaine Metabolite,Ur ~~LOC~~: POSITIVE (ref ?–300)
MDMA (ECSTASY) UR SCREEN: NEGATIVE (ref ?–500)
METHADONE, UR SCREEN: NEGATIVE (ref ?–300)
OPIATE, UR SCREEN: NEGATIVE (ref ?–300)
Phencyclidine (PCP) Ur S: NEGATIVE (ref ?–25)
TRICYCLIC, UR SCREEN: NEGATIVE (ref ?–1000)

## 2014-03-10 LAB — HCG, QUANTITATIVE, PREGNANCY: BETA HCG, QUANT.: 115771 m[IU]/mL — AB

## 2014-04-25 ENCOUNTER — Emergency Department: Payer: Self-pay | Admitting: Emergency Medicine

## 2014-04-25 LAB — COMPREHENSIVE METABOLIC PANEL
ALBUMIN: 3.1 g/dL — AB (ref 3.4–5.0)
ALT: 19 U/L
Alkaline Phosphatase: 50 U/L
Anion Gap: 6 — ABNORMAL LOW (ref 7–16)
BUN: 10 mg/dL (ref 7–18)
Bilirubin,Total: 0.2 mg/dL (ref 0.2–1.0)
CALCIUM: 8.8 mg/dL (ref 8.5–10.1)
CO2: 24 mmol/L (ref 21–32)
Chloride: 108 mmol/L — ABNORMAL HIGH (ref 98–107)
Creatinine: 0.67 mg/dL (ref 0.60–1.30)
EGFR (African American): >60
EGFR (Non-African Amer.): >60
GLUCOSE: 85 mg/dL (ref 65–99)
Osmolality: 274 (ref 275–301)
Potassium: 3.6 mmol/L (ref 3.5–5.1)
SGOT(AST): 14 U/L — ABNORMAL LOW (ref 15–37)
SODIUM: 138 mmol/L (ref 136–145)
TOTAL PROTEIN: 6.9 g/dL (ref 6.4–8.2)

## 2014-04-25 LAB — URINALYSIS, COMPLETE
Bacteria: NONE SEEN
Bilirubin,UR: NEGATIVE
Blood: NEGATIVE
GLUCOSE, UR: NEGATIVE mg/dL (ref 0–75)
Hyaline Cast: 2
Ketone: NEGATIVE
Leukocyte Esterase: NEGATIVE
Nitrite: NEGATIVE
PH: 6 (ref 4.5–8.0)
PROTEIN: NEGATIVE
RBC,UR: 1 /HPF (ref 0–5)
Specific Gravity: 1.025 (ref 1.003–1.030)
WBC UR: <1 /HPF (ref 0–5)

## 2014-04-25 LAB — CBC WITH DIFFERENTIAL/PLATELET
BASOS PCT: 0.2 %
Basophil #: 0 10*3/uL (ref 0.0–0.1)
EOS ABS: 0 10*3/uL (ref 0.0–0.7)
Eosinophil %: 0.5 %
HCT: 30.9 % — AB (ref 35.0–47.0)
HGB: 10.1 g/dL — AB (ref 12.0–16.0)
Lymphocyte #: 1.5 10*3/uL (ref 1.0–3.6)
Lymphocyte %: 18.4 %
MCH: 27.9 pg (ref 26.0–34.0)
MCHC: 32.8 g/dL (ref 32.0–36.0)
MCV: 85 fL (ref 80–100)
MONO ABS: 0.4 x10 3/mm (ref 0.2–0.9)
Monocyte %: 5.1 %
NEUTROS PCT: 75.8 %
Neutrophil #: 6.3 10*3/uL (ref 1.4–6.5)
PLATELETS: 157 10*3/uL (ref 150–440)
RBC: 3.62 10*6/uL — AB (ref 3.80–5.20)
RDW: 13.7 % (ref 11.5–14.5)
WBC: 8.3 10*3/uL (ref 3.6–11.0)

## 2014-04-25 LAB — GC/CHLAMYDIA PROBE AMP

## 2014-04-25 LAB — WET PREP, GENITAL

## 2014-04-25 LAB — HCG, QUANTITATIVE, PREGNANCY: BETA HCG, QUANT.: 32184 m[IU]/mL — AB

## 2014-05-05 LAB — OB RESULTS CONSOLE ANTIBODY SCREEN: Antibody Screen: NEGATIVE

## 2014-05-05 LAB — OB RESULTS CONSOLE ABO/RH: RH Type: POSITIVE

## 2014-05-05 LAB — OB RESULTS CONSOLE RUBELLA ANTIBODY, IGM: Rubella: IMMUNE

## 2014-05-05 LAB — OB RESULTS CONSOLE VARICELLA ZOSTER ANTIBODY, IGG: VARICELLA IGG: IMMUNE

## 2014-05-05 LAB — OB RESULTS CONSOLE GC/CHLAMYDIA
Chlamydia: NEGATIVE
GC PROBE AMP, GENITAL: NEGATIVE

## 2014-05-05 LAB — OB RESULTS CONSOLE RPR: RPR: NONREACTIVE

## 2014-05-05 LAB — OB RESULTS CONSOLE HIV ANTIBODY (ROUTINE TESTING): HIV: NONREACTIVE

## 2014-05-05 LAB — OB RESULTS CONSOLE HEPATITIS B SURFACE ANTIGEN: HEP B S AG: NEGATIVE

## 2014-05-05 NOTE — L&D Delivery Note (Signed)
Delivery Note At 2:05 AM a viable female was delivered via Vaginal, Spontaneous Delivery (Presentation: Left Occiput Anterior).  APGAR: 8, 9  weight 7 lb 3 oz (3260 g).   Infant's name: undecided (possibly Jace or Chrissie NoaWilliam)  Placenta status: Intact, Spontaneous.  Cord: 3 vessels with the following complications: None.   Anesthesia: Epidural  Episiotomy: None Lacerations: None Suture Repair: none Est. Blood Loss (mL): 150  Mom to postpartum.  Baby to Couplet care / Skin to Skin.  SVD of a live viable female infant in LOA position. After delivery of the head, a nuchal cord was noted and was not able to be reduced so the baby was somersaulted through. The infant was then placed on the maternal abdomen where the cord was clamped and cut after pulsation ceased. Apgars were 8/9. The placenta was then delivered via schultz mechanism and was intact with a 3-vessel cord. It was noted to have an accessory lobe upon inspection. Pitocin was then started and the uterus was firm with fundal massage and minimal bleeding. EBL was ~ 150. The perineum was inspected and was intact. Mother and baby are bonding well. Mother plans to bottle feed.   Jannet MantisSubudhi,  Belva Koziel 10/05/2014, 2:37 AM

## 2014-05-18 ENCOUNTER — Emergency Department: Payer: Self-pay | Admitting: Emergency Medicine

## 2014-05-18 LAB — URINALYSIS, COMPLETE
BILIRUBIN, UR: NEGATIVE
BLOOD: NEGATIVE
Bacteria: NONE SEEN
Glucose,UR: NEGATIVE mg/dL (ref 0–75)
KETONE: NEGATIVE
Leukocyte Esterase: NEGATIVE
Nitrite: NEGATIVE
PH: 6 (ref 4.5–8.0)
PROTEIN: NEGATIVE
Specific Gravity: 1.016 (ref 1.003–1.030)
Squamous Epithelial: 7
WBC UR: NONE SEEN /HPF (ref 0–5)

## 2014-05-18 LAB — CBC
HCT: 29.9 % — ABNORMAL LOW (ref 35.0–47.0)
HGB: 10 g/dL — ABNORMAL LOW (ref 12.0–16.0)
MCH: 28.5 pg (ref 26.0–34.0)
MCHC: 33.3 g/dL (ref 32.0–36.0)
MCV: 86 fL (ref 80–100)
Platelet: 165 10*3/uL (ref 150–440)
RBC: 3.5 10*6/uL — ABNORMAL LOW (ref 3.80–5.20)
RDW: 14 % (ref 11.5–14.5)
WBC: 9.4 10*3/uL (ref 3.6–11.0)

## 2014-05-18 LAB — HCG, QUANTITATIVE, PREGNANCY: BETA HCG, QUANT.: 11240 m[IU]/mL — AB

## 2014-05-19 ENCOUNTER — Emergency Department: Payer: Self-pay | Admitting: Emergency Medicine

## 2014-05-21 LAB — BETA STREP CULTURE(ARMC)

## 2014-05-25 ENCOUNTER — Encounter: Payer: Self-pay | Admitting: Obstetrics & Gynecology

## 2014-08-09 ENCOUNTER — Observation Stay
Admit: 2014-08-09 | Disposition: A | Discharge status: Home / Self Care | Payer: Self-pay | Attending: Certified Nurse Midwife | Admitting: Certified Nurse Midwife

## 2014-08-09 ENCOUNTER — Emergency Department
Admit: 2014-08-09 | Disposition: A | Discharge status: Home / Self Care | Payer: Self-pay | Admitting: Emergency Medicine

## 2014-08-18 ENCOUNTER — Other Ambulatory Visit: Payer: Self-pay | Admitting: Maternal & Fetal Medicine

## 2014-08-18 DIAGNOSIS — F1910 Other psychoactive substance abuse, uncomplicated: Secondary | ICD-10-CM

## 2014-08-23 ENCOUNTER — Emergency Department
Admit: 2014-08-23 | Disposition: A | Discharge status: Home / Self Care | Payer: Self-pay | Admitting: Emergency Medicine

## 2014-08-23 LAB — HM HIV SCREENING LAB: HM HIV Screening: NEGATIVE

## 2014-08-24 LAB — OB RESULTS CONSOLE HIV ANTIBODY (ROUTINE TESTING): HIV: NONREACTIVE

## 2014-08-24 LAB — OB RESULTS CONSOLE RPR: RPR: NONREACTIVE

## 2014-08-25 NOTE — Consult Note (Signed)
Brief Consult Note: Diagnosis: Cocaine dependence.   Patient was seen by consultant.   Consult note dictated.   Recommend further assessment or treatment.   Orders entered.   Discussed with Attending MD.   Comments: [name] has a h/o substance abuse and mood instability. She came to the hospital agitated and psychotic in the context of substance use. She is sober now and no longer suicidal or homicidal.   PLAN: 1. The patient no longer meets criteria for IVC. I will terminate proceedings. Please discharge as appropriate.  2. She takes no medications.    3. She declines substance abuse treatment.   4. [facility]  Electronic Signatures: Kristine LineaPucilowska, Deshonda Cryderman (MD)  (Signed [phone]-Nov-14 14:22)  Authored: Brief Consult Note   Last Updated: 10-Nov-14 14:22 by Kristine LineaPucilowska, Dyan Creelman (MD)

## 2014-08-25 NOTE — Consult Note (Signed)
PATIENT NAME:  [name], Young MR#:  [id number] DATE OF BIRTH:  [birth year]  DATE OF CONSULTATION:  03/14/2013  REFERRING PHYSICIAN:  Wells Guiles L. Reita Cliche, MD CONSULTING PHYSICIAN:  Elora Wolter B. Kyla Duffy, MD  REASON FOR CONSULTATION: To evaluate an agitated patient.   IDENTIFYING DATA: [name] Young is a 29 year old female with history of bipolar disorder and substance abuse.   CHIEF COMPLAINT: "I don't remember."   HISTORY OF PRESENT ILLNESS: [name] Young was brought to the Emergency Room agitated after cocaine binge. She was given Haldol and Ativan in the Emergency Room and fell asleep, was difficult to assess. By the time I met the patient, she was awake, pleasant, polite and cooperative. She explained that she did some cocaine, did not remember how she ended up in the hospital. She does not believe that this is a problem. She has had been off all her psychiatric medication, and last time she was treated was during her pregnancy in 2013. She is not  following up with any mental health providers. She denies any symptoms of depression, anxiety or psychosis. There are no symptoms suggestive of bipolar mania. She denies, other than cocaine, substance use. She has a stable living situation and does not feel that she needed to come to the Emergency Room in the first place.   PAST PSYCHIATRIC HISTORY: She says that 2 prior hospitalizations at [facility] in 2013 and 2012. At that time, she was diagnosed with bipolar disorder and even though she was pregnant, she was started on Zyprexa for that. She reported since delivery, she had no trouble. She denies any prior suicide attempt and downplays her substance use. She has never been treated for substance abuse.   FAMILY PSYCHIATRIC HISTORY: None reported.   PAST MEDICAL HISTORY: None.   ALLERGIES: No known drug allergies.   MEDICATIONS ON ADMISSION: None.   SOCIAL HISTORY: She lives with her mother. She has several children. One of her kids  lives with the father. She completed high school in special education class. She has support from her mother and her sister. She has no health insurance. It is unclear why she does not have Medicaid, having small children.   REVIEW OF SYSTEMS:    CONSTITUTIONAL: No fevers or chills. No weight changes.  EYES: No double or blurred vision.  ENT: No hearing loss.  RESPIRATORY: No shortness of breath or cough.  CARDIOVASCULAR: No chest pain or orthopnea.  GASTROINTESTINAL: No abdominal pain, nausea, vomiting, or diarrhea.  GENITOURINARY: No incontinence or frequency.  ENDOCRINE: No heat or cold intolerance.  LYMPHATIC: No anemia or easy bruising.  INTEGUMENTARY: No acne or rash.  MUSCULOSKELETAL: No muscle or joint pain.  NEUROLOGIC: No tingling or weakness.  PSYCHIATRIC: See history of present illness for details.   PHYSICAL EXAMINATION: VITAL SIGNS: Blood pressure 116/72, pulse 88, respirations 18, temperature 98.6.  GENERAL: This is a well-developed female in no acute distress.   The rest of the physical examination is deferred to her primary attending.   LABORATORY DATA: Chemistries are within normal limits. Blood alcohol level is zero. LFTs within normal limits. TSH 1.73. Urine tox screen positive for cocaine. CBC within normal limits, except for white blood count of 11.3. Urinalysis is not suggestive of urinary tract infection. Urine pregnancy test is negative.   MENTAL STATUS EXAMINATION: The patient is alert and oriented to person, place, time and situation. She is pleasant, polite and cooperative. She recognizes me from admission a couple of years ago. She maintains good  eye contact. She is well groomed, wearing hospital scrubs and a yellow shirt. Her speech is of normal rhythm, rate and volume. Mood is fine with full affect. Thought process is logical and goal oriented. Thought content: She denies suicidal or homicidal ideation. There are no delusions or paranoia. There are no auditory  or visual hallucinations. Her cognition is grossly intact. She registers 3 out of 3 and recalls 3 out of 3 objects after 5 minutes. She can spell "world" forward and backward. She knows the current president. Her insight and judgment are questionable.   DIAGNOSES: AXIS I: Cocaine abuse. Schizoaffective disorder, bipolar type, in remission.  AXIS II: Deferred.  AXIS III: None.  AXIS IV: Substance abuse.  AXIS V: Global Assessment of Functioning 55.   PLAN:  1.  The patient no longer meets criteria for involuntary inpatient psychiatric commitment. I will terminate proceedings. Please discharge as appropriate.  2.  She takes no medication. No medication recommended at this point.  3.  The patient minimizes substance abuse and declines residential treatment.  4.  She has an appointment with Dr. Timmothy Euler at [facility] tomorrow.    ____________________________ Wardell Honour Bary Leriche, MD jbp:jcm D: 03/15/2013 16:01:30 ET T: 03/15/2013 16:32:42 ET JOB#: [id number]  cc: Christon Parada B. Bary Leriche, MD, <Dictator> Clovis Fredrickson MD ELECTRONICALLY SIGNED 03/18/2013 20:43
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
## 2014-08-27 NOTE — Consult Note (Signed)
Brief Consult Note: Diagnosis: Schizoaffective disorder bipolar type, polysubstance dependence.   Patient was seen by consultant.   Consult note dictated.   Recommend further assessment or treatment.   Orders entered.   Discussed with Attending MD.   Comments: [name] Young has a h/o mood instability, substance abuse and borderline intellectual finctioning. She was just discharged from long-term residential substance abuse tx program. She has been treated with JordanLatuda and BuSpar there. She relepsed on alcohol and cocaine last night and came to the ER drunk. She is sober now. She is not suicidal or homicidal.   PLAN: 1. The patient does not meet criteria for IVC. PLease discharge as appropriate.  2. She is to continue Latuda 40 mg with dinner and BuSpar 10 mg twice daily.   3. She has appointment with [facility]eman [facility] tomorrow at 3:00 pm.   4. She is encouraged to follow up [facility] and was provided with necessary information.  Electronic Signatures: [name] Young, [name] Young (MD)  (Signed 02-Jul-13 10:51)  Authored: Brief Consult Note   Last Updated: 02-Jul-13 10:51 by [name] Young, [name] Young (MD)

## 2014-09-03 ENCOUNTER — Telehealth: Payer: Self-pay | Admitting: *Deleted

## 2014-09-03 ENCOUNTER — Observation Stay
Admission: EM | Admit: 2014-09-03 | Discharge: 2014-09-03 | Disposition: A | Discharge status: Home / Self Care | Payer: Medicaid Other | Attending: Obstetrics & Gynecology | Admitting: Obstetrics & Gynecology

## 2014-09-03 DIAGNOSIS — O368130 Decreased fetal movements, third trimester, not applicable or unspecified: Principal | ICD-10-CM | POA: Insufficient documentation

## 2014-09-03 DIAGNOSIS — O99333 Smoking (tobacco) complicating pregnancy, third trimester: Secondary | ICD-10-CM | POA: Insufficient documentation

## 2014-09-03 DIAGNOSIS — Z3A34 34 weeks gestation of pregnancy: Secondary | ICD-10-CM | POA: Diagnosis not present

## 2014-09-03 DIAGNOSIS — O368190 Decreased fetal movements, unspecified trimester, not applicable or unspecified: Secondary | ICD-10-CM | POA: Diagnosis present

## 2014-09-03 HISTORY — DX: Unspecified abnormal cytological findings in specimens from cervix uteri: R87.619

## 2014-09-03 HISTORY — DX: Anemia, unspecified: D64.9

## 2014-09-03 HISTORY — DX: Major depressive disorder, single episode, severe with psychotic features: F32.3

## 2014-09-03 HISTORY — DX: Urinary tract infection, site not specified: N39.0

## 2014-09-03 HISTORY — DX: Other specified eating disorder: F50.89

## 2014-09-03 HISTORY — DX: Pica in adults: F50.83

## 2014-09-03 HISTORY — DX: Auditory hallucinations: R44.0

## 2014-09-03 NOTE — H&P (Signed)
Obstetric History and Physical  [name] Young is a 29 y.o. [phone] with IUP at 8539w5d presenting for decreased fetal movement. She reports that she had laid down on her side and performed a kick count before she came to L&D and that she did not get 10 movements in 2 hours. Patient also stated that "I am hurting." She reports that she pain started at 9 am and has been intermittent. She has not been able to time them. She reports back pain with the abdominal pain. She denies vb, lof, fever, chills, nausea or vomiting.    Prenatal Course Source of Care: [facility]  with onset of care at 17 weeks Pregnancy complications or risks: Patient Active Problem List   Diagnosis Date Noted  . Decreased fetal movement 09/03/2014   She plans to breastfeed, plans to bottle feed She is  undecided about postpartum contraception.   Prenatal labs and studies: ABO, Rh: O/Positive/-- (01/01 0000) Antibody: Negative (01/01 0000) Rubella: Immune (01/01 0000) RPR: Nonreactive (01/01 0000)  HBsAg: Negative (01/01 0000)  HIV:    GBS: unknown Genetic screening NA Anatomy US normal  Prenatal Transfer Tool  Maternal Diabetes: No Genetic Screening: Declined Maternal Ultrasounds/Referrals: Normal Fetal Ultrasounds or other Referrals:  None Maternal Substance Abuse:  Yes:  Type: Smoker, Marijuana, Cocaine  MJ and cocaine in the past  Significant Maternal Medications:  None Significant Maternal Lab Results: None  Past Medical History  Diagnosis Date  . Anemia   . Severe major depression with psychotic features 2012  . Auditory hallucination 2012  . UTI (urinary tract infection)   . Pica in adults   . Abnormal Pap smear of cervix     Past Surgical History  Procedure Laterality Date  . Abdominal surgery    . Salpingectomy  2007    partial    OB History  Gravida Para Term Preterm AB SAB TAB Ectopic Multiple Living  5 3 3  1   1  3     # Outcome Date GA Lbr Len/2nd Weight Sex Delivery Anes PTL Lv  5  Current           4 Term 09/19/11 2215w0d  2.807 kg (6 lb 3 oz) F Vag-Spont   Y  3 Term 09/06/08 5475w4d  3.204 kg (7 lb 1 oz) F Vag-Spont   Y  2 Ectopic 2007 2453w0d            Comments: partial salpingectomy ?  1 Term 03/29/04 7176w3d  3.289 kg (7 lb 4 oz) M Vag-Spont   Y      History   Social History  . Marital Status: Single    Spouse Name: N/A  . Number of Children: N/A  . Years of Education: N/A   Social History Main Topics  . Smoking status: Current Every Day Smoker -- 0.50 packs/day for 4 years    Types: Cigarettes  . Smokeless tobacco: Never Used  . Alcohol Use: Yes     Comment: occassional- in the past   . Drug Use: Yes    Special: Cocaine, Marijuana     Comment: past history of use- last + UDS in 2015   . Sexual Activity: Yes    Birth Control/ Protection: None   Other Topics Concern  . None   Social History Narrative    No family history on file.  No prescriptions prior to admission    No Known Allergies  Review of Systems: Negative except for what is mentioned in HPI.  Physical Exam: BP 95/61 mmHg  Pulse 104  Temp(Src) 99.2 F (37.3 C) (Oral)  Resp 16  LMP 01/13/2014 GENERAL: Well-developed, well-nourished female in no acute distress.  LUNGS: Clear to auscultation bilaterally.  HEART: Regular rate and rhythm. ABDOMEN: Soft, nontender, nondistended, gravid. EXTREMITIES: Nontender, no edema, 2+ distal pulses. Cervical Exam: Pt did not stay for exam Presentation: no exam performed FHT:  Baseline rate 130 bpm   Variability moderate  Accelerations present   Decelerations none Contractions: 1 present, irritability noted   Pertinent Labs/Studies:   No results found for this or any previous visit (from the past 24 hour(s)).  Assessment : [name] Young is a 29 y.o. [phone] at [redacted]w[redacted]d seen for decreased fetal movement.  Reactive NST- category 1 FHT  Plan: Pt seen for DFM. Upon asking questions about health history, pt becomes irrate and states that "no  one is helping me" and " I am hurting" Pt states that she is " going to [facility]" because she never has a "good experience here" and is " never coming back again." Discussed with pt that a health history and exam needs to be completed as well as some potential labs to be able to come up with a plan of care. Pt states that she is leaving and " going to [facility]" where the can help her " pain." Pt asked to sign AMA papers and refused stating that " I'm not signing those so that y'all can hold me here." Explained to pt that the papers say that she is refusing medical treatment and is leaving against the providers advice. Pt still refuses and walks out of the unit.   [name] Young, CNM [facility]

## 2014-09-03 NOTE — Progress Notes (Signed)
Alric Quan. Subudhi CNM at bedside with R. Penichich and Clinical research associatewriter.  At bedside to assess patient.  [facility] and upset that contractions are not picking up on monitor as often as she is feeling them.  Abdomen palpated by writer and Roxanne, no contractions palpated while in room.  Patient upset stating she is going to leave.  Out of bed and taking monitors off.  Does not want to wait for AMA papers.  [facility] of [facility] and [facility]

## 2014-09-03 NOTE — OB Triage Note (Signed)
29 yo African American patient c/o decreased fetal movement and contractions (presenting in her back and radiating to her front). Contractions have presented since this morning.  Noticed decreased fetal movement since this morning. Denies vaginal bleeding or increased discharge.  Denies urinary symptoms.

## 2014-09-03 NOTE — Telephone Encounter (Signed)
Patient called L&D complaining she had not felt the baby move. She stated she has eaten, drank cold water, and had juice and still did not feel baby move. Instructed on fetal kick counts. She agreed that is what was done. Encouraged to come to the hospital to be checked out.

## 2014-09-12 LAB — OB RESULTS CONSOLE GC/CHLAMYDIA
Chlamydia: NEGATIVE
GC PROBE AMP, GENITAL: NEGATIVE

## 2014-09-12 LAB — OB RESULTS CONSOLE GBS: GBS: POSITIVE
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
## 2014-09-12 NOTE — H&P (Signed)
L&D Evaluation:  History:   HPI 29 yo G4P2012 at 778w3d GA by LMP. PNC at [facility]. pregnancy complicated by polysubstance abuse, most recently cocaine 4 days ago, as well as EtOH. Her pregnancy has also been complicated by incarceration, major depressive disorder, tobacco abuse, history of ectopic pregnancy in 2007, abnormal pap smear (ASCUS, +HPV).  She presents with decreased fetal movement and abdominal pain x 2 days. Denies vaginal bleeding and leakage of fluid. She states she has been having an abnormal discharge. Denies urinary symptoms.    Presents with abdominal pain, decreased fetal movement    Patient's Medical History Polysubstance abuse, multiple psychiatric issues    Patient's Surgical History partial left salpingectomy due to ectopic pregnancy in 2007    Allergies NKDA    Social History as above    Family History Non-Contributory   ROS:   ROS All normal except as per HPI   Exam:   Vital Signs stable    Urine Protein negative dipstick    General no apparent distress    Mental Status clear    Chest clear    Heart normal sinus rhythm    Abdomen gravid, non-tender    Back no CVAT    Edema no edema    Pelvic no external lesions, cervix closed and thick, no bleeding noted from cervix or pooled in vagina    Mebranes Intact    FHT normal rate with no decels, given gestational age    Fetal Heart Rate 150    Skin mild uterine irritability    Other Wet mount: + clue cells, neg trich KOH: no fungal elements UA: trace bacteria, neg protein, neg LE, neg nitrites   Impression:   Impression 1) patient noted multiple fetal movements upon arrival, 2) reassuring FHR tracing given gestational age,3) BV, 4) possible UTI   Plan:   Comments 1) plan was to treat BV and send UA for culture, but treat presumptively for UTI given irritability. 2) no evidence of abruption or current intoxication 3) patient eloped AMA prior to getting counseling and treatment  (prescription) for above.    Follow Up Appointment unsure, could not ascertain prior to elopement   Electronic Signatures: Conard NovakJackson, Lismary Kiehn D (MD)  (Signed 09-Feb-13 13:07)  Authored: L&D Evaluation   Last Updated: 09-Feb-13 13:07 by Conard NovakJackson, Valeriano Bain D (MD)

## 2014-09-12 NOTE — H&P (Signed)
L&D Evaluation:  History:   HPI 29 yo G4P2012 at [redacted]w[redacted]d GA by LMP. PNC at [facility]. pregnancy complicated by polysubstance abuse,  as well as EtOH. Her pregnancy has also been complicated by incarceration, major depressive disorder, tobacco abuse, history of ectopic pregnancy in 2007, abnormal pap smear (ASCUS, +HPV).  She presented 3/22 with recent alcohol use, suicidal ideation, and abdominal pain w mild ctxs. After obstetrical evaluation found no issues she was admitted to the behavioral medicine service.  Today she complained of decreased fetal movement and abdominal cramping.  She was brought to L&D for evaluation of these complaints. However, upon arrival she is heavily sedated and unable to respond to my questions.  She appears comfortable and is asleep.    Presents with abdominal pain, decreased fetal movement    Patient's Medical History Polysubstance abuse, multiple psychiatric issues    Patient's Surgical History partial left salpingectomy due to ectopic pregnancy in 2007    Allergies NKDA    Social History as above    Family History Non-Contributory   ROS:   ROS All normal except as per HPI   Exam:   Vital Signs stable    Urine Protein not completed    General no apparent distress    Mental Status other  as per HPI. She is asleep and it requires great effort to get her attention to answer a question    Chest clear    Heart normal sinus rhythm    Abdomen gravid, non-tender    Back no CVAT    Edema no edema    Mebranes Intact    Description Not performed    FHT normal rate with no decels    FHT Description 140/mod var/+10x10s/no decels    Ucx approximately 2 contractions in 45 minutes    Skin dry   Impression:   Impression No signs/symptoms Preterm Labor.   Plan:   Comments Monitoring complete and stable. Rec daily FHT monitoring while in hospital. Dr Maryruth BunKapur of psychiatry to resume patient care. Ideally, I would've been able to perform a cervical  check. However, given her current mental status and inability to consent to a cervical exam, i did not perform one. She is having minimal uterine contractions and the fetal well-being is reassuring.    Follow Up Appointment need to schedule   Electronic Signatures: Conard NovakJackson, Breyana Follansbee D (MD)  (Signed 23-Mar-13 23:39)  Authored: L&D Evaluation   Last Updated: 23-Mar-13 23:39 by Conard NovakJackson, Rosamae Rocque D (MD)

## 2014-09-12 NOTE — H&P (Signed)
L&D Evaluation:  History Expanded:   HPI 29 yo [id number] at [redacted]weeks GA by LMP. PNC at [facility]. pregnancy complicated by polysubstance abuse,  as well as EtOH. Her pregnancy has also been complicated by incarceration, major depressive disorder, tobacco abuse, history of ectopic pregnancy in 2007, abnormal pap smear (ASCUS, +HPV).  She presents with recent alcohol use, suicidal ideation, and abdominal pain w mild ctxs. Denies vaginal bleeding and leakage of fluid. Denies urinary symptoms.    Presents with abdominal pain, decreased fetal movement    Patient's Medical History Polysubstance abuse, multiple psychiatric issues    Patient's Surgical History partial left salpingectomy due to ectopic pregnancy in 2007    Allergies NKDA    Social History as above    Family History Non-Contributory   ROS:   ROS All normal except as per HPI   Exam:   Vital Signs stable    Urine Protein negative dipstick    General no apparent distress    Mental Status clear    Chest clear    Heart normal sinus rhythm    Abdomen gravid, non-tender    Back no CVAT    Edema no edema    Pelvic no external lesions, cervix closed and thick    Mebranes Intact    FHT normal rate with no decels    Fetal Heart Rate 150    Ucx absent    Skin dry   Impression:   Impression No signs/symptoms Preterm Labor.  Substance abuse.  Suicidal ideation.   Plan:   Comments Monitoring complete and stable. Rec daily FHT monitoring while in hospital. Dr Maryruth BunKapur of psychiatry to see and take over patient care.    Follow Up Appointment need to schedule. unsure, could not ascertain prior to elopement   Labs:  Urine Drugs:  22-Mar-13 14:12    Tricyclic Antidepressant, Ur Qual (comp) NEGATIVE   Amphetamines, Urine Qual. NEGATIVE   MDMA, Urine Qual. NEGATIVE   Cocaine Metabolite, Urine Qual. NEGATIVE   Opiate, Urine qual NEGATIVE   Phencyclidine, Urine Qual. NEGATIVE   Cannabinoid, Urine Qual. NEGATIVE    Barbiturates, Urine Qual. NEGATIVE   Benzodiazepine, Urine Qual. NEGATIVE   Methadone, Urine Qual. NEGATIVE  Routine Chem:  22-Mar-13 14:15    Ethanol, S. < 3   Ethanol % (comp) < 0.003   Electronic Signatures: Letitia LibraHarris, Tanish Sinkler Paul (MD)  (Signed 22-Mar-13 15:25)  Authored: L&D Evaluation, Labs   Last Updated: 22-Mar-13 15:25 by Letitia LibraHarris, Yanni Ruberg Paul (MD)
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
## 2014-09-12 NOTE — H&P (Signed)
Patient went to labor and delivery for brief observation and then returned to the behavioral medicine floor. Please see original H&P  Electronic Signatures: Caryn SectionKapur, Koehn Salehi (MD)  (Signed on 29-Mar-13 18:25)  Authored  Last Updated: 29-Mar-13 18:25 by Caryn SectionKapur, Martiza Speth (MD)

## 2014-09-13 ENCOUNTER — Encounter: Payer: Self-pay | Admitting: *Deleted

## 2014-09-13 ENCOUNTER — Observation Stay
Admission: EM | Admit: 2014-09-13 | Discharge: 2014-09-14 | Disposition: A | Discharge status: Home / Self Care | Payer: Medicaid Other | Attending: Certified Nurse Midwife | Admitting: Certified Nurse Midwife

## 2014-09-13 DIAGNOSIS — O26899 Other specified pregnancy related conditions, unspecified trimester: Principal | ICD-10-CM | POA: Insufficient documentation

## 2014-09-13 DIAGNOSIS — Z3490 Encounter for supervision of normal pregnancy, unspecified, unspecified trimester: Secondary | ICD-10-CM

## 2014-09-13 DIAGNOSIS — R109 Unspecified abdominal pain: Secondary | ICD-10-CM | POA: Insufficient documentation

## 2014-09-13 NOTE — OB Triage Note (Signed)
Pt arrived via wheelchair from ER with complaints of lower abdominal pressure and back pain in lower and upper back that comes and goes. Pt reports positive fetal movement and denies any leaking fluid or vaginal bleeding.

## 2014-09-13 NOTE — Progress Notes (Signed)
Farrel Connersolleen Gutierrez, CNM notified of pt arrival to unit, hx, GA, and complaint. Orders received per CNM.

## 2014-09-14 ENCOUNTER — Observation Stay
Admission: EM | Admit: 2014-09-14 | Discharge: 2014-09-14 | Discharge status: Against Medical Advice | Payer: Medicaid Other | Source: Home / Self Care

## 2014-09-14 ENCOUNTER — Ambulatory Visit
Admission: RE | Admit: 2014-09-14 | Discharge: 2014-09-14 | Disposition: A | Discharge status: Home / Self Care | Payer: Medicaid Other | Source: Ambulatory Visit | Attending: Maternal & Fetal Medicine | Admitting: Maternal & Fetal Medicine

## 2014-09-14 DIAGNOSIS — R102 Pelvic and perineal pain: Secondary | ICD-10-CM | POA: Insufficient documentation

## 2014-09-14 DIAGNOSIS — O26899 Other specified pregnancy related conditions, unspecified trimester: Secondary | ICD-10-CM | POA: Insufficient documentation

## 2014-09-14 DIAGNOSIS — F1910 Other psychoactive substance abuse, uncomplicated: Secondary | ICD-10-CM

## 2014-09-14 DIAGNOSIS — R109 Unspecified abdominal pain: Secondary | ICD-10-CM | POA: Diagnosis present

## 2014-09-14 LAB — US OB FOLLOW UP

## 2014-09-14 LAB — URINALYSIS COMPLETE WITH MICROSCOPIC (ARMC ONLY)
BACTERIA UA: NONE SEEN
Bilirubin Urine: NEGATIVE
GLUCOSE, UA: NEGATIVE mg/dL
Hgb urine dipstick: NEGATIVE
KETONES UR: NEGATIVE mg/dL
Leukocytes, UA: NEGATIVE
Nitrite: NEGATIVE
Protein, ur: NEGATIVE mg/dL
RBC / HPF: NONE SEEN RBC/hpf (ref 0–5)
SPECIFIC GRAVITY, URINE: 1.011 (ref 1.005–1.030)
pH: 6 (ref 5.0–8.0)

## 2014-09-14 NOTE — Progress Notes (Signed)
[name] Young, CNM present at bedside discussing plan of care. SVE per CNM. CNM gave pt the option to ambulate in halls and pt chose to stay in bed at this time. Pt continues to complain of lower back pain and abdominal pain and states "i'm hurting." TOCO readjusted several times. Abdomen remains soft and non-tender. Palpated what pt felt was a contraction and abdomen remained soft.

## 2014-09-14 NOTE — Progress Notes (Signed)
Maebelle Munroe Gutierrez, CNM notified that pt is requesting to be discharged home. Orders received to recheck pt cervix, if no change pt may be discharged.

## 2014-09-14 NOTE — Progress Notes (Signed)
Pt refusing to walk, pt wanted to be discharged home. CNM to be notified.

## 2014-09-14 NOTE — Progress Notes (Signed)
[name] Young, CNM performing bedside u/s to determine presentation.

## 2014-09-14 NOTE — Progress Notes (Signed)
Received pt to LD from ER,  Pt complains of pelvic pressure and unable to walk, up to BR- pt states she could not void and no urine specimen obtained.  Pt to bed for fetal monitoring and SVE pt refused- States she wants a doctor to check her-  Pt very agitated when doing admission questions and states she is leaving- pt procedes to remove monitors and got up to get dressed to leave-- pt would not wait for provider and states she is not feeling it and is leaving- states she may not come back- clothes applied and quidkly left AMA

## 2014-09-23 ENCOUNTER — Telehealth: Payer: Self-pay | Admitting: Family

## 2014-09-23 ENCOUNTER — Observation Stay
Admission: EM | Admit: 2014-09-23 | Discharge: 2014-09-23 | Disposition: A | Discharge status: Home / Self Care | Payer: Medicaid Other | Attending: Obstetrics & Gynecology | Admitting: Obstetrics & Gynecology

## 2014-09-23 DIAGNOSIS — M545 Low back pain: Secondary | ICD-10-CM

## 2014-09-23 DIAGNOSIS — Z3A00 Weeks of gestation of pregnancy not specified: Secondary | ICD-10-CM | POA: Insufficient documentation

## 2014-09-23 DIAGNOSIS — O368190 Decreased fetal movements, unspecified trimester, not applicable or unspecified: Secondary | ICD-10-CM | POA: Diagnosis present

## 2014-09-23 NOTE — Progress Notes (Signed)
We are sorry that you are not feeling well.  Here is how we plan to help!  Based on what you have shared with me it looks like you mostly have acute back pain.  Acute back pain is defined as musculoskeletal pain that can resolve in 1-3 weeks with conservative treatment.  Unfortunately, due to your pregnancy, we are very limited on the medications we can safely prescribe. The best recommendation is to follow the non-medication options below and use Tylenol as directed on the label, over the counter. Back pain is very common.  The pain often gets better over time.  The cause of back pain is usually not dangerous.  Most people can learn to manage their back pain on their own.  Home Care  Stay active.  Start with short walks on flat ground if you can.  Try to walk farther each day.  Do not sit, drive or stand in one place for more than 30 minutes.  Do not stay in bed.  Do not avoid exercise or work.  Activity can help your back heal faster.  Be careful when you bend or lift an object.  Bend at your knees, keep the object close to you, and do not twist.  Sleep on a firm mattress.  Lie on your side, and bend your knees.  If you lie on your back, put a pillow under your knees.  Only take medicines as told by your doctor.  Put ice on the injured area.  Put ice in a plastic bag  Place a towel between your skin and the bag  Leave the ice on for 15-20 minutes, 3-4 times a day for the first 2-3 days.  After that, you can switch between ice and heat packs.  Ask your doctor about back exercises or massage.  Avoid feeling anxious or stressed.  Find good ways to deal with stress, such as exercise.  Get Help Right Way If:  Your pain does not go away with rest or medicine.  Your pain does not go away in 1 week.  You have new problems.  You do not feel well.  The pain spreads into your legs.  You cannot control when you poop (bowel movement) or pee (urinate)  You feel sick to your  stomach (nauseous) or throw up (vomit)  You have belly (abdominal) pain.  You feel like you may pass out (faint).  If you develop a fever.  Make Sure you:  Understand these instructions.  Will watch your condition  Will get help right away if you are not doing well or get worse.  Your e-visit answers were reviewed by a board certified advanced clinical practitioner to complete your personal care plan.  Depending on the condition, your plan could have included both over the counter or prescription medications.  If there is a problem please reply  once you have received a response from your provider.  Your safety is important to us.  If you have drug allergies check your prescription carefully.    You can use MyChart to ask questions about today's visit, request a non-urgent call back, or ask for a work or school excuse.  You will get an e-mail in the next two days asking about your experience.  I hope that your e-visit has been valuable and will speed your recovery. Thank you for using e-visits.

## 2014-09-23 NOTE — OB Triage Provider Note (Signed)
NST interpretation:  FHT: 130, moderate variability, +accelrations, no decelerations TOCO: no contractions  Reactive NST.  Category 1 tracing  Ranae Plumberhelsea Caela Huot, MD

## 2014-09-23 NOTE — OB Triage Note (Signed)
29 y.o. female presents today with complaint of decreased fetal movement since yesterday .  States that this started yesterday during the day and that for awhile she has not felt the baby move as much. This has been going on for 24 hours. She states that nothing makes it better and nothing makes it worse. At home has tried to eat and drink to make it better.  Wants to know whether doctor will be coming to see her.  Also states she woke up with wet panties a couple days ago, but denies any leaking since.  Denies bleeding or pain.

## 2014-10-03 ENCOUNTER — Inpatient Hospital Stay
Admission: RE | Admit: 2014-10-03 | Discharge: 2014-10-06 | DRG: 775 | Disposition: A | Discharge status: Home / Self Care | Payer: Medicaid Other | Attending: Obstetrics & Gynecology | Admitting: Obstetrics & Gynecology

## 2014-10-03 DIAGNOSIS — O99334 Smoking (tobacco) complicating childbirth: Secondary | ICD-10-CM | POA: Diagnosis present

## 2014-10-03 DIAGNOSIS — F17210 Nicotine dependence, cigarettes, uncomplicated: Secondary | ICD-10-CM | POA: Diagnosis present

## 2014-10-03 DIAGNOSIS — Z3A39 39 weeks gestation of pregnancy: Secondary | ICD-10-CM | POA: Diagnosis present

## 2014-10-03 DIAGNOSIS — F1210 Cannabis abuse, uncomplicated: Secondary | ICD-10-CM | POA: Diagnosis present

## 2014-10-03 DIAGNOSIS — F1410 Cocaine abuse, uncomplicated: Secondary | ICD-10-CM | POA: Diagnosis present

## 2014-10-03 DIAGNOSIS — Z590 Homelessness: Secondary | ICD-10-CM

## 2014-10-03 DIAGNOSIS — O99324 Drug use complicating childbirth: Principal | ICD-10-CM | POA: Diagnosis present

## 2014-10-03 DIAGNOSIS — O0993 Supervision of high risk pregnancy, unspecified, third trimester: Secondary | ICD-10-CM | POA: Diagnosis present

## 2014-10-03 LAB — CBC
HCT: 28.8 % — ABNORMAL LOW (ref 35.0–47.0)
Hemoglobin: 9.5 g/dL — ABNORMAL LOW (ref 12.0–16.0)
MCH: 28.4 pg (ref 26.0–34.0)
MCHC: 32.9 g/dL (ref 32.0–36.0)
MCV: 86.4 fL (ref 80.0–100.0)
PLATELETS: 159 10*3/uL (ref 150–440)
RBC: 3.34 MIL/uL — ABNORMAL LOW (ref 3.80–5.20)
RDW: 14.5 % (ref 11.5–14.5)
WBC: 10 10*3/uL (ref 3.6–11.0)

## 2014-10-03 LAB — URINE DRUG SCREEN, QUALITATIVE (ARMC ONLY)
AMPHETAMINES, UR SCREEN: NOT DETECTED
Barbiturates, Ur Screen: NOT DETECTED
Benzodiazepine, Ur Scrn: NOT DETECTED
Cannabinoid 50 Ng, Ur ~~LOC~~: NOT DETECTED
Cocaine Metabolite,Ur ~~LOC~~: NOT DETECTED
MDMA (ECSTASY) UR SCREEN: NOT DETECTED
Methadone Scn, Ur: NOT DETECTED
OPIATE, UR SCREEN: NOT DETECTED
PHENCYCLIDINE (PCP) UR S: NOT DETECTED
TRICYCLIC, UR SCREEN: NOT DETECTED

## 2014-10-03 LAB — TYPE AND SCREEN
ABO/RH(D): O POS
Antibody Screen: NEGATIVE

## 2014-10-03 LAB — ABO/RH: ABO/RH(D): O POS

## 2014-10-03 MED ORDER — OXYTOCIN 40 UNITS IN LACTATED RINGERS INFUSION - SIMPLE MED: 62.5000 mL/h | INTRAVENOUS | Status: DC

## 2014-10-03 MED ORDER — SODIUM CHLORIDE 0.9 % IV SOLN
INTRAVENOUS | Status: AC
  Administered 2014-10-03: 2 g via INTRAVENOUS
  Filled 2014-10-03: qty 2000

## 2014-10-03 MED ORDER — SODIUM CHLORIDE 0.9 % IV SOLN
2.0000 g | Freq: Once | INTRAVENOUS | Status: AC
  Administered 2014-10-03: 2 g via INTRAVENOUS

## 2014-10-03 MED ORDER — LACTATED RINGERS IV SOLN: 500.0000 mL | INTRAVENOUS | Status: DC | PRN

## 2014-10-03 MED ORDER — OXYTOCIN BOLUS FROM INFUSION: 500.0000 mL | INTRAVENOUS | Status: DC

## 2014-10-03 MED ORDER — ONDANSETRON HCL 4 MG/2ML IJ SOLN: 4.0000 mg | Freq: Four times a day (QID) | INTRAMUSCULAR | Status: DC | PRN

## 2014-10-03 MED ORDER — CITRIC ACID-SODIUM CITRATE 334-500 MG/5ML PO SOLN: 30.0000 mL | ORAL | Status: DC | PRN

## 2014-10-03 MED ORDER — DINOPROSTONE 10 MG VA INST
10.0000 mg | VAGINAL_INSERT | Freq: Once | VAGINAL | Status: AC
  Administered 2014-10-03: 10 mg via VAGINAL
  Filled 2014-10-03: qty 1

## 2014-10-03 MED ORDER — TERBUTALINE SULFATE 1 MG/ML IJ SOLN: 0.2500 mg | Freq: Once | INTRAMUSCULAR | Status: AC | PRN

## 2014-10-03 MED ORDER — ACETAMINOPHEN 325 MG PO TABS: 650.0000 mg | ORAL_TABLET | ORAL | Status: DC | PRN

## 2014-10-03 MED ORDER — LIDOCAINE HCL (PF) 1 % IJ SOLN
30.0000 mL | INTRAMUSCULAR | Status: DC | PRN
  Filled 2014-10-03: qty 30

## 2014-10-03 MED ORDER — LACTATED RINGERS IV SOLN
INTRAVENOUS | Status: DC
  Administered 2014-10-03 – 2014-10-04 (×2): via INTRAVENOUS

## 2014-10-03 NOTE — Plan of Care (Signed)
[facility] with plan for IOL for high risk status for substance abuse.  Pt placed in LDR 3

## 2014-10-04 MED ORDER — BUTORPHANOL TARTRATE 1 MG/ML IJ SOLN: 1.0000 mg | INTRAMUSCULAR | Status: DC | PRN

## 2014-10-04 MED ORDER — MISOPROSTOL 25 MCG QUARTER TABLET
25.0000 ug | ORAL_TABLET | ORAL | Status: DC
  Administered 2014-10-04 (×3): 25 ug via ORAL
  Filled 2014-10-04 (×7): qty 1

## 2014-10-04 MED ORDER — MISOPROSTOL 25 MCG QUARTER TABLET
ORAL_TABLET | ORAL | Status: AC
  Filled 2014-10-04: qty 0.25

## 2014-10-04 MED ORDER — BUTORPHANOL TARTRATE 1 MG/ML IJ SOLN
1.0000 mg | INTRAMUSCULAR | Status: DC | PRN
  Administered 2014-10-04 (×2): 1 mg via INTRAVENOUS
  Administered 2014-10-04: 2 mg via INTRAVENOUS
  Administered 2014-10-04: 1 mg via INTRAVENOUS

## 2014-10-04 MED ORDER — BUTORPHANOL TARTRATE 1 MG/ML IJ SOLN
INTRAMUSCULAR | Status: AC
Start: 2014-10-04 — End: 2014-10-04
  Administered 2014-10-04: 1 mg via INTRAVENOUS
  Filled 2014-10-04: qty 1

## 2014-10-04 MED ORDER — SODIUM CHLORIDE 0.9 % IV SOLN
1.0000 g | INTRAVENOUS | Status: DC
  Administered 2014-10-04 (×4): 1 g via INTRAVENOUS
  Filled 2014-10-04 (×2): qty 1000

## 2014-10-04 MED ORDER — SODIUM CHLORIDE 0.9 % IV SOLN
INTRAVENOUS | Status: AC
  Administered 2014-10-04: 1 g via INTRAVENOUS
  Filled 2014-10-04: qty 1000

## 2014-10-04 MED ORDER — MISOPROSTOL 200 MCG PO TABS
ORAL_TABLET | ORAL | Status: AC
  Filled 2014-10-04: qty 4

## 2014-10-04 MED ORDER — BUTORPHANOL TARTRATE 1 MG/ML IJ SOLN
INTRAMUSCULAR | Status: AC
  Administered 2014-10-04: 2 mg via INTRAVENOUS
  Filled 2014-10-04: qty 2

## 2014-10-04 MED ORDER — MISOPROSTOL 25 MCG QUARTER TABLET
ORAL_TABLET | ORAL | Status: AC
  Administered 2014-10-04: 25 ug via ORAL
  Filled 2014-10-04: qty 0.25

## 2014-10-04 MED ORDER — AMMONIA AROMATIC IN INHA
RESPIRATORY_TRACT | Status: AC
  Filled 2014-10-04: qty 10

## 2014-10-04 MED ORDER — AMPICILLIN SODIUM 1 G IJ SOLR
INTRAMUSCULAR | Status: AC
  Administered 2014-10-04: 1 g via INTRAVENOUS
  Filled 2014-10-04: qty 1000

## 2014-10-04 MED ORDER — BUTORPHANOL TARTRATE 1 MG/ML IJ SOLN
INTRAMUSCULAR | Status: AC
  Administered 2014-10-04: 1 mg via INTRAVENOUS
  Filled 2014-10-04: qty 1

## 2014-10-04 MED ORDER — SODIUM CHLORIDE 0.9 % IV SOLN: 1.0000 g | INTRAVENOUS | Status: DC

## 2014-10-04 MED ORDER — OXYTOCIN 40 UNITS IN LACTATED RINGERS INFUSION - SIMPLE MED
INTRAVENOUS | Status: AC
  Filled 2014-10-04: qty 1000

## 2014-10-04 MED ORDER — LIDOCAINE HCL (PF) 1 % IJ SOLN
INTRAMUSCULAR | Status: AC
  Filled 2014-10-04: qty 30

## 2014-10-04 NOTE — Progress Notes (Signed)
   Subjective:  Pt comfortable, having some mild contractions   Objective: BP 111/65 mmHg  Pulse 72  Temp(Src) 98.3 F (36.8 C) (Oral)  Resp 18  Ht 5\' 1"  (1.549 m)  Wt 72.122 kg (159 lb)  BMI 30.06 kg/m2  LMP 01/13/2014 I/O last 3 completed shifts: In: 5550 [IV Piggyback:50] Out: -  Total I/O In: 2747.9 [I.V.:2647.9; IV Piggyback:100] Out: -   FHT:  FHR: 125 bpm, variability: moderate,  accelerations:  Present,  decelerations:  Absent UC:   Irregular   Last SVE:   Dilation: 1.5 Effacement (%): 60 Station: -3 Exam by:: k. yates, rn   6:19 PM last sve attempted but aborted as pt stated to stop as it was uncomfortable  Foley bulb placed via visualization with speculum. 35 ml inserted into the balloon. Pt tolerated procedure well.   Labs: Lab Results  Component Value Date   WBC 10.0 10/03/2014   HGB 9.5* 10/03/2014   HCT 28.8* 10/03/2014   MCV 86.4 10/03/2014   PLT 159 10/03/2014    Assessment / Plan: IOL for hx of substance abuse   Labor: continue induction with foley bulb and cytotec Fetal Wellbeing:  Category I Pain Control:  IV medication for now, plans epidural  Anticipated MOD:  NSVD  Jannet MantisSubudhi,  Shantara Goosby 10/04/2014, 6:13 PM

## 2014-10-04 NOTE — Progress Notes (Signed)
Cervidil pulled. Cervix 1.5/60/-3, CNM Subudhi ay bedside, POC discussed.

## 2014-10-04 NOTE — H&P (Addendum)
[name] Young is a 29 y.o. female [phone] @ 5639.1 presenting for IOL due to high-risk pregnancy for social reasons.  Patient has a history of drug abuse, prior arrest and jail time, and homelessness.  She has housing now, and has not had a positive UDS since early pregnancy.    Maternal Medical History:  Prenatal complications: Substance abuse.   Prenatal Complications - Diabetes: none.    OB History    Gravida Para Term Preterm AB TAB SAB Ectopic Multiple Living   5 3 3  1   1  3      Past Medical History  Diagnosis Date  . Anemia   . Severe major depression with psychotic features 2012  . Auditory hallucination 2012  . UTI (urinary tract infection)   . Pica in adults   . Abnormal Pap smear of cervix    Past Surgical History  Procedure Laterality Date  . Abdominal surgery    . Salpingectomy  2007    partial   Family History: family history is not on file. Social History:  reports that she has been smoking Cigarettes.  She has a 1 pack-year smoking history. She has never used smokeless tobacco. She reports that she does not drink alcohol or use illicit drugs.   Prenatal Transfer Tool  Maternal Diabetes: No Genetic Screening: Normal Maternal Ultrasounds/Referrals: Normal Fetal Ultrasounds or other Referrals:  Referred to Materal Fetal Medicine  Maternal Substance Abuse:  Yes:  Type: Smoker, Marijuana, Cocaine Significant Maternal Medications:  None Significant Maternal Lab Results:  None Other Comments:  None  Review of Systems  Constitutional: Negative.   HENT: Negative.   Eyes: Negative.   Respiratory: Negative.   Cardiovascular: Negative.   Gastrointestinal: Negative.   Genitourinary: Negative.   Musculoskeletal: Negative.   Skin: Negative.   Neurological: Negative.   Psychiatric/Behavioral: Negative.   All other systems reviewed and are negative.   Dilation: Fingertip Effacement (%): 30 Station: -3 Exam by:: Eugene GarnetE. Sedgwick, RN Blood pressure 86/46, pulse  80, temperature 98.6 F (37 C), temperature source Oral, resp. rate 16, height 5\' 1"  (1.549 m), weight 72.122 kg (159 lb), last menstrual period 01/13/2014.   Maternal Exam:  Abdomen: Patient reports no abdominal tenderness. Estimated fetal weight is 7.4.   Fetal presentation: vertex  Introitus: Normal vulva.   Physical Exam  Constitutional: She is oriented to person, place, and time. She appears well-developed and well-nourished.  Cardiovascular: Normal rate and regular rhythm.   Respiratory: Effort normal and breath sounds normal. She has no wheezes. She has no rales.  GI: Soft. She exhibits no distension and no mass. There is no tenderness. There is no rebound and no guarding.  Genitourinary: Vagina normal and uterus normal.  Neurological: She is alert and oriented to person, place, and time. She has normal reflexes.  Skin: Skin is warm.  Psychiatric: She has a normal mood and affect. Her behavior is normal.   FHT: 135 mod +accel no decels   Prenatal labs: ABO, Rh: --/--/O POS (05/31 2103) Antibody: NEG (05/31 1830) Rubella: Immune (01/01 0000) RPR: Nonreactive (04/21 0000)  HBsAg: Negative (01/01 0000)  HIV: Non-reactive (04/21 0000)  GBS: Positive (05/10 0000)   Assessment/Plan:  1. Admit to L&D for high-risk pregnancy due to social reasons, for IOL with cervical ripening overnight. 2. Cervidil  3. UDS  4. Continuous EFM/TOCO   [name] C 10/04/2014, 12:46 AM

## 2014-10-04 NOTE — Progress Notes (Signed)
[name] MarylandBrittney S Young is a 29 y.o. [id number] at 6834w1d by admitted for IOL for history of substance abuse  Subjective:  Pt reports feeling some abdominal cramping.   Objective: BP 119/69 mmHg  Pulse 75  Temp(Src) 98.1 F (36.7 C) (Oral)  Resp 16  Ht 5\' 1"  (1.549 m)  Wt 72.122 kg (159 lb)  BMI 30.06 kg/m2  LMP 01/13/2014      FHT:  FHR: 125 bpm, variability: moderate,  accelerations:  Present,  decelerations:  Absent UC:   regular, every 2-3 minutes SVE:   Dilation: 1.5 Effacement (%): 60 Station: -3 Exam by:: k. yates, rn  Labs: Lab Results  Component Value Date   WBC 10.0 10/03/2014   HGB 9.5* 10/03/2014   HCT 28.8* 10/03/2014   MCV 86.4 10/03/2014   PLT 159 10/03/2014    Assessment / Plan: IOL for substance abuse  Labor: WIll give 25 mcg of cytotec SL route for cervical ripening.  Fetal Wellbeing:  Category I Pain Control:  stadol for pain. Plans epidural when in labor I/D:  ampicillin for GBS Anticipated MOD:  NSVD  [name] Young,  [name] Young 10/04/2014, 10:40 AM

## 2014-10-04 NOTE — Progress Notes (Signed)
Intrapartum progress note  Patient awake, texting, rates her pain as a 7.   No overnight events  BP 100/63 mmHg  Pulse 56  Temp(Src) 98.1 F (36.7 C) (Oral)  Resp 16    FHT: 120 mod +accels, no decels TOCO: q8-6310mins Cervix: deferred until removal of cervadil or change in disposition  A/P: 28yo [id number] @ 39.1 with IOL for high risk pregnancy  1. Continue cervical ripening with cervidil, reevaluate after 12 hours or with change in disposition. 2. Category 1 tracing.  Ranae Plumberhelsea Teleah Villamar, MD Attending Obstetrician and Gynecologist [facility]

## 2014-10-05 ENCOUNTER — Encounter: Payer: Self-pay | Admitting: Certified Nurse Midwife

## 2014-10-05 ENCOUNTER — Inpatient Hospital Stay: Payer: Medicaid Other | Admitting: Anesthesiology

## 2014-10-05 LAB — RPR: RPR Ser Ql: NONREACTIVE

## 2014-10-05 LAB — CBC
HEMATOCRIT: 27.9 % — AB (ref 35.0–47.0)
Hemoglobin: 9.4 g/dL — ABNORMAL LOW (ref 12.0–16.0)
MCH: 29 pg (ref 26.0–34.0)
MCHC: 33.8 g/dL (ref 32.0–36.0)
MCV: 86.1 fL (ref 80.0–100.0)
PLATELETS: 136 10*3/uL — AB (ref 150–440)
RBC: 3.24 MIL/uL — ABNORMAL LOW (ref 3.80–5.20)
RDW: 14.7 % — AB (ref 11.5–14.5)
WBC: 10.2 10*3/uL (ref 3.6–11.0)

## 2014-10-05 MED ORDER — FENTANYL 2.5 MCG/ML W/ROPIVACAINE 0.2% IN NS 100 ML EPIDURAL INFUSION (ARMC-ANES)
10.0000 mL/h | EPIDURAL | Status: DC
Start: 2014-10-05 — End: 2014-10-05

## 2014-10-05 MED ORDER — EPHEDRINE 5 MG/ML INJ
10.0000 mg | INTRAVENOUS | Status: DC | PRN
  Filled 2014-10-05: qty 2

## 2014-10-05 MED ORDER — ACETAMINOPHEN 325 MG PO TABS
650.0000 mg | ORAL_TABLET | ORAL | Status: DC | PRN
  Administered 2014-10-05 (×2): 650 mg via ORAL
  Filled 2014-10-05 (×2): qty 2

## 2014-10-05 MED ORDER — TERBUTALINE SULFATE 1 MG/ML IJ SOLN
0.2500 mg | Freq: Once | INTRAMUSCULAR | Status: DC | PRN
  Filled 2014-10-05: qty 1

## 2014-10-05 MED ORDER — TETANUS-DIPHTH-ACELL PERTUSSIS 5-2.5-18.5 LF-MCG/0.5 IM SUSP: 0.5000 mL | Freq: Once | INTRAMUSCULAR | Status: DC

## 2014-10-05 MED ORDER — OXYTOCIN 40 UNITS IN LACTATED RINGERS INFUSION - SIMPLE MED
INTRAVENOUS | Status: AC
  Filled 2014-10-05: qty 1000

## 2014-10-05 MED ORDER — OXYCODONE-ACETAMINOPHEN 5-325 MG PO TABS: 2.0000 | ORAL_TABLET | ORAL | Status: DC | PRN

## 2014-10-05 MED ORDER — OXYTOCIN 40 UNITS IN LACTATED RINGERS INFUSION - SIMPLE MED: 62.5000 mL/h | Freq: Once | INTRAVENOUS | Status: DC

## 2014-10-05 MED ORDER — DIPHENHYDRAMINE HCL 50 MG/ML IJ SOLN: 12.5000 mg | INTRAMUSCULAR | Status: DC | PRN

## 2014-10-05 MED ORDER — FERROUS SULFATE 325 (65 FE) MG PO TABS
325.0000 mg | ORAL_TABLET | Freq: Two times a day (BID) | ORAL | Status: DC
  Administered 2014-10-05 – 2014-10-06 (×3): 325 mg via ORAL
  Filled 2014-10-05 (×3): qty 1

## 2014-10-05 MED ORDER — PHENYLEPHRINE 40 MCG/ML (10ML) SYRINGE FOR IV PUSH (FOR BLOOD PRESSURE SUPPORT)
80.0000 ug | PREFILLED_SYRINGE | INTRAVENOUS | Status: DC | PRN
  Filled 2014-10-05: qty 2

## 2014-10-05 MED ORDER — OXYTOCIN 40 UNITS IN LACTATED RINGERS INFUSION - SIMPLE MED: 1.0000 m[IU]/min | INTRAVENOUS | Status: DC

## 2014-10-05 MED ORDER — DIPHENHYDRAMINE HCL 25 MG PO CAPS: 25.0000 mg | ORAL_CAPSULE | Freq: Four times a day (QID) | ORAL | Status: DC | PRN

## 2014-10-05 MED ORDER — FENTANYL 2.5 MCG/ML W/ROPIVACAINE 0.2% IN NS 100 ML EPIDURAL INFUSION (ARMC-ANES)
EPIDURAL | Status: AC
  Administered 2014-10-05: 10 mL/h via EPIDURAL
  Filled 2014-10-05: qty 100

## 2014-10-05 MED ORDER — IBUPROFEN 600 MG PO TABS
600.0000 mg | ORAL_TABLET | Freq: Four times a day (QID) | ORAL | Status: DC
  Administered 2014-10-05 – 2014-10-06 (×4): 600 mg via ORAL
  Filled 2014-10-05 (×4): qty 1

## 2014-10-05 MED ORDER — ONDANSETRON HCL 4 MG/2ML IJ SOLN: 4.0000 mg | INTRAMUSCULAR | Status: DC | PRN

## 2014-10-05 MED ORDER — SENNOSIDES-DOCUSATE SODIUM 8.6-50 MG PO TABS: 2.0000 | ORAL_TABLET | ORAL | Status: DC

## 2014-10-05 MED ORDER — PRENATAL MULTIVITAMIN CH
1.0000 | ORAL_TABLET | Freq: Every day | ORAL | Status: DC
  Administered 2014-10-05 – 2014-10-06 (×2): 1 via ORAL
  Filled 2014-10-05 (×2): qty 1

## 2014-10-05 MED ORDER — DIBUCAINE 1 % RE OINT: 1.0000 "application " | TOPICAL_OINTMENT | RECTAL | Status: DC | PRN

## 2014-10-05 MED ORDER — LANOLIN HYDROUS EX OINT: TOPICAL_OINTMENT | CUTANEOUS | Status: DC | PRN

## 2014-10-05 MED ORDER — ONDANSETRON HCL 4 MG PO TABS: 4.0000 mg | ORAL_TABLET | ORAL | Status: DC | PRN

## 2014-10-05 MED ORDER — LIDOCAINE-EPINEPHRINE (PF) 1.5 %-1:200000 IJ SOLN
INTRAMUSCULAR | Status: DC | PRN
  Administered 2014-10-05: 2 mL via PERINEURAL

## 2014-10-05 MED ORDER — BENZOCAINE-MENTHOL 20-0.5 % EX AERO: 1.0000 "application " | INHALATION_SPRAY | CUTANEOUS | Status: DC | PRN

## 2014-10-05 MED ORDER — WITCH HAZEL-GLYCERIN EX PADS: 1.0000 "application " | MEDICATED_PAD | CUTANEOUS | Status: DC | PRN

## 2014-10-05 MED ORDER — ZOLPIDEM TARTRATE 5 MG PO TABS: 5.0000 mg | ORAL_TABLET | Freq: Every evening | ORAL | Status: DC | PRN

## 2014-10-05 MED ORDER — SIMETHICONE 80 MG PO CHEW: 80.0000 mg | CHEWABLE_TABLET | ORAL | Status: DC | PRN

## 2014-10-05 MED ORDER — METHYLERGONOVINE MALEATE 0.2 MG PO TABS: 0.2000 mg | ORAL_TABLET | ORAL | Status: DC | PRN

## 2014-10-05 MED ORDER — MAGNESIUM HYDROXIDE 400 MG/5ML PO SUSP: 30.0000 mL | ORAL | Status: DC | PRN

## 2014-10-05 MED ORDER — BUPIVACAINE HCL (PF) 0.25 % IJ SOLN
INTRAMUSCULAR | Status: DC | PRN
  Administered 2014-10-05: 5 mL via PERINEURAL
  Administered 2014-10-05: 2 mL via PERINEURAL

## 2014-10-05 MED ORDER — METHYLERGONOVINE MALEATE 0.2 MG/ML IJ SOLN: 0.2000 mg | INTRAMUSCULAR | Status: DC | PRN

## 2014-10-05 MED ORDER — OXYCODONE-ACETAMINOPHEN 5-325 MG PO TABS: 1.0000 | ORAL_TABLET | ORAL | Status: DC | PRN

## 2014-10-05 NOTE — Progress Notes (Signed)
  Subjective:  PT reports hurting more.  Objective: BP 116/66 mmHg  Pulse 73  Temp(Src) 98.3 F (36.8 C) (Oral)  Resp 18  Ht 5\' 1"  (1.549 m)  Wt 72.122 kg (159 lb)  BMI 30.06 kg/m2  LMP 01/13/2014 I/O last 3 completed shifts: In: 2797.9 [I.V.:2647.9; IV Piggyback:150] Out: -     FHT:  FHR: 135 bpm, variability: moderate,  accelerations:  Present,  decelerations:  Absent UC:   regular, every 2 minutes SVE:   Dilation: 6 Effacement (%): 80 Station: -1 Exam by:: C. Demarie Hyneman, CNM  Foley bulb out. AROM for clear fluid   Labs: Lab Results  Component Value Date   WBC 10.0 10/03/2014   HGB 9.5* 10/03/2014   HCT 28.8* 10/03/2014   MCV 86.4 10/03/2014   PLT 159 10/03/2014    Assessment / Plan: IOL for substance abuse   Labor: IOL progressing well. AROM for clear fluid. Will get epidural and start pitocin after.  Fetal Wellbeing:  Category I Pain Control:  Epidural Anticipated MOD:  NSVD  [name] Young,  [name] Young 10/05/2014, 12:21 AM

## 2014-10-05 NOTE — Anesthesia Procedure Notes (Signed)
Epidural Patient location during procedure: OB Start time: 10/05/2014 12:38 AM End time: 10/05/2014 12:55 AM  Staffing Anesthesiologist: Elijio MilesVAN STAVEREN, Liandra Mendia F Performed by: anesthesiologist   Preanesthetic Checklist Completed: patient identified, site marked, surgical consent, pre-op evaluation, timeout performed, IV checked, risks and benefits discussed and monitors and equipment checked  Epidural Patient position: sitting Prep: Betadine Patient monitoring: heart rate, continuous pulse ox and blood pressure Approach: midline Location: L3-L4 Injection technique: LOR air and LOR saline  Needle:  Needle type: Tuohy  Needle gauge: 18 G Needle length: 9 cm Needle insertion depth: 5 cm Catheter type: closed end flexible Catheter size: 20 Guage Catheter at skin depth: 9 cm Test dose: negative and 1.5% lidocaine with Epi 1:200 K  Assessment Sensory level: T8

## 2014-10-05 NOTE — Lactation Note (Signed)
This note was copied from the chart of [name] Young. Lactation Consultation Note  Patient Name: [name] Young [id number]'s Date: 10/05/2014 Reason for consult: Initial assessment. Mother has no interest in breast feeding. Education about engorgement given.   Maternal Data Formula Feeding for Exclusion: Yes  Feeding Feeding Type: Bottle Fed - Formula Nipple Type: Regular  LATCH Score/Interventions                      Lactation Tools Discussed/Used     Consult Status      Trudee GripCarolyn P Karan Inclan 10/05/2014, 4:30 PM

## 2014-10-06 MED ORDER — MEDROXYPROGESTERONE ACETATE 150 MG/ML IM SUSP
150.0000 mg | Freq: Once | INTRAMUSCULAR | Status: AC
  Administered 2014-10-06: 150 mg via INTRAMUSCULAR
  Filled 2014-10-06: qty 1

## 2014-10-06 NOTE — Discharge Instructions (Signed)
Call your doctor for increased pain or vaginal bleeding, temperature above 100.4, depression, or concerns.  No strenuous activity or heavy lifting for 6 weeks.  No intercourse, tampons, douching, or enemas for 6 weeks.  No tub baths-showers only.  No driving for 2 weeks or while taking pain medications.  Continue prenatal vitamin and iron.    Postpartum Care After Vaginal Delivery After you deliver your newborn (postpartum period), the usual stay in the hospital is 24-72 hours. If there were problems with your labor or delivery, or if you have other medical problems, you might be in the hospital longer.  While you are in the hospital, you will receive help and instructions on how to care for yourself and your newborn during the postpartum period.  While you are in the hospital:  Be sure to tell your nurses if you have pain or discomfort, as well as where you feel the pain and what makes the pain worse.  If you had an incision made near your vagina (episiotomy) or if you had some tearing during delivery, the nurses may put ice packs on your episiotomy or tear. The ice packs may help to reduce the pain and swelling.  If you are breastfeeding, you may feel uncomfortable contractions of your uterus for a couple of weeks. This is normal. The contractions help your uterus get back to normal size.  It is normal to have some bleeding after delivery.  For the first 1-3 days after delivery, the flow is red and the amount may be similar to a period.  It is common for the flow to start and stop.  In the first few days, you may pass some small clots. Let your nurses know if you begin to pass large clots or your flow increases.  Do not  flush blood clots down the toilet before having the nurse look at them.  During the next 3-10 days after delivery, your flow should become more watery and pink or brown-tinged in color.  Ten to fourteen days after delivery, your flow should be a small amount of  yellowish-white discharge.  The amount of your flow will decrease over the first few weeks after delivery. Your flow may stop in 6-8 weeks. Most women have had their flow stop by 12 weeks after delivery.  You should change your sanitary pads frequently.  Wash your hands thoroughly with soap and water for at least 20 seconds after changing pads, using the toilet, or before holding or feeding your newborn.  You should feel like you need to empty your bladder within the first 6-8 hours after delivery.  In case you become weak, lightheaded, or faint, call your nurse before you get out of bed for the first time and before you take a shower for the first time.  Within the first few days after delivery, your breasts may begin to feel tender and full. This is called engorgement. Breast tenderness usually goes away within 48-72 hours after engorgement occurs. You may also notice milk leaking from your breasts. If you are not breastfeeding, do not stimulate your breasts. Breast stimulation can make your breasts produce more milk.  Spending as much time as possible with your newborn is very important. During this time, you and your newborn can feel close and get to know each other. Having your newborn stay in your room (rooming in) will help to strengthen the bond with your newborn. It will give you time to get to know your newborn and become comfortable  caring for your newborn.  Your hormones change after delivery. Sometimes the hormone changes can temporarily cause you to feel sad or tearful. These feelings should not last more than a few days. If these feelings last longer than that, you should talk to your caregiver.  If desired, talk to your caregiver about methods of family planning or contraception.  Talk to your caregiver about immunizations. Your caregiver may want you to have the following immunizations before leaving the hospital:  Tetanus, diphtheria, and pertussis (Tdap) or tetanus and  diphtheria (Td) immunization. It is very important that you and your family (including grandparents) or others caring for your newborn are up-to-date with the Tdap or Td immunizations. The Tdap or Td immunization can help protect your newborn from getting ill.  Rubella immunization.  Varicella (chickenpox) immunization.  Influenza immunization. You should receive this annual immunization if you did not receive the immunization during your pregnancy. Document Released: 02/16/2007 Document Revised: 01/14/2012 Document Reviewed: 12/17/2011 [facility] Patient Information 2015 [employer] [place]. This information is not intended to replace advice given to you by your health care provider. Make sure you discuss any questions you have with your health care provider.

## 2014-10-06 NOTE — Progress Notes (Signed)
Admit Date: 10/03/2014 Today's Date: 10/06/2014  Post Partum Day 1 1/2  Subjective:  no complaints, up ad lib and tolerating PO  Objective: Temp:  [98 F (36.7 C)-98.9 F (37.2 C)] 98.5 F (36.9 C) (06/03 0811) Pulse Rate:  [47-59] 56 (06/03 0811) Resp:  [18] 18 (06/03 0811) BP: (94-131)/(46-74) 131/74 mmHg (06/03 0811) SpO2:  [100 %] 100 % (06/03 0811)  Physical Exam:  General: alert, cooperative and appears stated age Lochia: appropriate Uterine Fundus: firm Incision: none DVT Evaluation: No evidence of DVT seen on physical exam.   Recent Labs  10/03/14 1830 10/05/14 0458  HGB 9.5* 9.4*  HCT 28.8* 27.9*    Assessment/Plan: Discharge home, Bottle Feeding and Infant doing well   LOS: 3 days   Ebbie Cherry [facility] 10/06/2014, 10:51 AM

## 2014-10-06 NOTE — Progress Notes (Addendum)
Pt called RN to room multiple times beginning at 0740 to verbalize frustration that they have not been discharged to home.  RN, AD, and security in room to discuss need to have MD orders before discharge due to pt cursing at staff.  Pt and sig other verbalize understanding of need to wait for MD orders and discharge instructions prior to discharge. Reynold BowenSusan Paisley Jullian Clayson, RN 10/06/2014 11:57 AM

## 2014-10-06 NOTE — Progress Notes (Signed)
Prenatal records indicate that pt received TDaP vaccine on 07/03/11.  Pt declines vaccine at this time.  Prenatal records indicate that pt received Influenza vaccine on 05/04/14. Reynold BowenSusan Paisley Pasquale Matters, RN 10/06/2014 1:24 PM

## 2014-10-06 NOTE — Anesthesia Postprocedure Evaluation (Signed)
  Anesthesia Post-op Note  Patient: [name] Young  Procedure(s) Performed: * No procedures listed *  Anesthesia type:No value filed.  Patient location: PACU  Post pain: Pain level controlled  Post assessment: Post-op Vital signs reviewed, Patient's Cardiovascular Status Stable, Respiratory Function Stable, Patent Airway and No signs of Nausea or vomiting  Post vital signs: Reviewed and stable  Last Vitals:  Filed Vitals:   10/06/14 0026  BP: 94/46  Pulse: 47  Temp: 36.9 C  Resp: 18    Level of consciousness: awake, alert  and patient cooperative  Complications: No apparent anesthesia complications

## 2014-10-06 NOTE — Discharge Summary (Signed)
Obstetrical Discharge Summary  Date of Admission: 10/03/2014 Date of Discharge: 10/05/14  Primary OB:  [facility]   Gestational Age at Delivery: 344w2d  Antepartum complications: none Date of Delivery: 10/05/14   Delivered By: Midwife Subudhi Delivery Type: spontaneous vaginal delivery Intrapartum complications/course: None Anesthesia: none Placenta: spontaneous Laceration: none Episiotomy: none  Post partum course: Since the delivery, patient has tolerate activity, diet, and daily functions without difficulty or complication.  Min lochia.  No breast concerns at this time.  No signs of depression currently.   Disposition: home with infant Rh Immune globulin given: no Rubella vaccine given: not applicable Varicella vaccine given: not applicable Tdap vaccine given in AP or PP setting: yes Flu vaccine given in AP or PP setting: no Contraception: Depo-Provera  Prenatal Labs: O POS//Rubella  immune//RPR negative//HIV negative/HepB Surface Ag negative//pap no abnormalities //plans to bottle feed  Plan:  [name] Young was discharged to home in good condition. [facility] weeks  Discharge Medications:   Medication List    TAKE these medications        multivitamin-prenatal 27-0.8 MG Tabs tablet  Take 1 tablet by mouth daily at 12 noon.

## 2014-10-06 NOTE — Progress Notes (Signed)
Discharge instructions provided.  Pt and sig other verbalize understanding of all instructions and follow-up care.  Pt discharged to home with infant at 1150 on 10/06/14 via wheelchair by LPN Reynold BowenSusan Paisley Thomos Domine, RN 10/06/2014 1:21 PM

## 2014-10-26 NOTE — Anesthesia Preprocedure Evaluation (Signed)
Anesthesia Evaluation  Patient identified by MRN, date of birth, ID band Patient awake    Reviewed: Allergy & Precautions, H&P , NPO status   Airway Mallampati: II       Dental no notable dental hx.    Pulmonary Current Smoker,    Pulmonary exam normal       Cardiovascular negative cardio ROS Normal cardiovascular exam    Neuro/Psych negative neurological ROS     GI/Hepatic negative GI ROS, Neg liver ROS,   Endo/Other  negative endocrine ROS  Renal/GU negative Renal ROS  negative genitourinary   Musculoskeletal   Abdominal   Peds  Hematology negative hematology ROS (+)   Anesthesia Other Findings   Reproductive/Obstetrics (+) Pregnancy                             Anesthesia Physical Anesthesia Plan  ASA: II  Anesthesia Plan: Epidural   Post-op Pain Management:    Induction:   Airway Management Planned:   Additional Equipment:   Intra-op Plan:   Post-operative Plan:   Informed Consent: I have reviewed the patients History and Physical, chart, labs and discussed the procedure including the risks, benefits and alternatives for the proposed anesthesia with the patient or authorized representative who has indicated his/her understanding and acceptance.     Plan Discussed with: Anesthesiologist  Anesthesia Plan Comments:         Anesthesia Quick Evaluation

## 2015-07-04 ENCOUNTER — Emergency Department
Admission: EM | Admit: 2015-07-04 | Discharge: 2015-07-04 | Disposition: A | Discharge status: Home / Self Care | Payer: Medicaid Other | Attending: Emergency Medicine | Admitting: Emergency Medicine

## 2015-07-04 DIAGNOSIS — Z0283 Encounter for blood-alcohol and blood-drug test: Secondary | ICD-10-CM | POA: Insufficient documentation

## 2015-07-04 DIAGNOSIS — Z79899 Other long term (current) drug therapy: Secondary | ICD-10-CM | POA: Insufficient documentation

## 2015-07-04 DIAGNOSIS — F17210 Nicotine dependence, cigarettes, uncomplicated: Secondary | ICD-10-CM | POA: Insufficient documentation

## 2015-07-04 DIAGNOSIS — F1410 Cocaine abuse, uncomplicated: Secondary | ICD-10-CM | POA: Insufficient documentation

## 2015-07-04 LAB — URINE DRUG SCREEN, QUALITATIVE (ARMC ONLY)
AMPHETAMINES, UR SCREEN: NOT DETECTED
BENZODIAZEPINE, UR SCRN: NOT DETECTED
Barbiturates, Ur Screen: NOT DETECTED
COCAINE METABOLITE, UR ~~LOC~~: POSITIVE — AB
Cannabinoid 50 Ng, Ur ~~LOC~~: NOT DETECTED
MDMA (Ecstasy)Ur Screen: NOT DETECTED
METHADONE SCREEN, URINE: NOT DETECTED
Opiate, Ur Screen: NOT DETECTED
Phencyclidine (PCP) Ur S: NOT DETECTED
Tricyclic, Ur Screen: NOT DETECTED

## 2015-07-04 NOTE — ED Notes (Signed)
Pt states that she has had urine drug screen that showed cocaine use and denies all cocaine use. She would like a blood draw to confirm no drug use for work purposes.

## 2015-07-04 NOTE — ED Notes (Signed)
Pt states she needs to go to her car to get something. RN informed pt she can't leave and come back wihtout checking back in. Pt told PA she was coming back, both family members at bedside also walked out.

## 2015-07-04 NOTE — ED Provider Notes (Signed)
[facility] Emergency Department Provider Note ____________________________________________  Time seen: 1715  I have reviewed the triage vital signs and the nursing notes.  HISTORY  Chief Complaint  Drug / Alcohol Assessment  HPI [name] Young is a 30 y.o. female presents to the ED for a drug screen request following third consecutive monthly positive UDS by her probation officer. The patient reports that she has had 3 previous monthly drug screens all flag positive for cocaine. The patient vehemently denies cocaine use, since January. She reports the rapid drug screen performed by the probation officer are routine and part of her probation agreement. She denies any other illicit drug use. She does admit to be an irregular smoker and occasional alcohol drinker. Patient is questioning any other possible causes for a positive cocaine drug screen. She would like to have some explanation as to why she would continue to have a positive drug screen despite her repeated denial of such use.  Past Medical History  Diagnosis Date  . Anemia   . Severe major depression with psychotic features 2012  . Auditory hallucination 2012  . UTI (urinary tract infection)   . Pica in adults   . Abnormal Pap smear of cervix     Patient Active Problem List   Diagnosis Date Noted  . Supervision of high risk pregnancy due to social problems, antepartum 10/04/2014  . High-risk pregnancy in third trimester 10/04/2014  . Labor and delivery, indication for care 10/04/2014  . Indication for care in labor or delivery 10/03/2014  . Indication for care in labor and delivery, antepartum 09/14/2014  . NST (non-stress test) reactive 09/14/2014  . Pregnant and not yet delivered 09/13/2014    Past Surgical History  Procedure Laterality Date  . Abdominal surgery    . Salpingectomy  2007    partial    Current Outpatient Rx  Name  Route  Sig  Dispense  Refill  . Prenatal Vit-Fe Fumarate-FA  (MULTIVITAMIN-PRENATAL) 27-0.8 MG TABS tablet   Oral   Take 1 tablet by mouth daily at 12 noon.          Allergies Review of patient's allergies indicates no known allergies.  No family history on file.  Social History Social History  Substance Use Topics  . Smoking status: Current Every Day Smoker -- 0.25 packs/day for 4 years    Types: Cigarettes  . Smokeless tobacco: Never Used  . Alcohol Use: No     Comment: occassional- in the past    Review of Systems  Constitutional: Negative for fever. Eyes: Negative for visual changes. ENT: Negative for sore throat. Cardiovascular: Negative for chest pain. Respiratory: Negative for shortness of breath. Gastrointestinal: Negative for abdominal pain, vomiting and diarrhea. Genitourinary: Negative for dysuria. Musculoskeletal: Negative for back pain. Skin: Negative for rash. Neurological: Negative for headaches, focal weakness or numbness. ____________________________________________  PHYSICAL EXAM:  VITAL SIGNS: ED Triage Vitals  Enc Vitals Group     BP 07/04/15 1607 110/79 mmHg     Pulse Rate 07/04/15 1607 85     Resp 07/04/15 1607 16     Temp 07/04/15 1607 98.6 F (37 C)     Temp Source 07/04/15 1607 Oral     SpO2 07/04/15 1607 99 %     Weight --      Height --      Head Cir --      Peak Flow --      Pain Score --  Pain Loc --      Pain Edu? --      Excl. in GC? --    Constitutional: Alert and oriented. Well appearing and in no distress. Head: Normocephalic and atraumatic. Eyes: Conjunctivae are normal. PERRL. Normal extraocular movements Cardiovascular: Normal rate, regular rhythm.  Respiratory: Normal respiratory effort.  Musculoskeletal: Nontender with normal range of motion in all extremities.  Neurologic:  Normal gait without ataxia. Normal speech and language. No gross focal neurologic deficits are appreciated. Skin:  Skin is warm, dry and intact. No rash noted. Psychiatric: Mood and affect are  normal. Patient exhibits appropriate insight and judgment. ____________________________________________   LABS (pertinent positives/negatives)  Labs Reviewed  URINE DRUG SCREEN, QUALITATIVE ([facility] ONLY) - Abnormal; Notable for the following:    Cocaine Metabolite,Ur [place] POSITIVE (*)    All other components within normal limits  ____________________________________________  INITIAL IMPRESSION / ASSESSMENT AND PLAN / ED COURSE  Patient with a self-requested UDS for confirmation of cocaine use. She does admit to use by close personal contact/partner. She is inquiring about other potential exposures that would cause a positive screen, including sexual contact and casual contact. She reassured that no other legal substances will cause a positive drug screen, other than cocaine.   ----------------------------------------- 6:46 PM on 07/04/2015 -----------------------------------------  Called lab to determine timeframe for results. Wait has been 75 minutes at this point. Was advised results would be available in 15 minutes.   ----------------------------------------- 7:06 PM on 07/04/2015 -----------------------------------------  Patient notified of UDS results. She is discharged without paperwork or printed results.  ____________________________________________  FINAL CLINICAL IMPRESSION(S) / ED DIAGNOSES  Final diagnoses:  Encounter for drug screening      Lissa Hoard, PA-C 07/04/15 1907  Arnaldo Natal, MD 07/04/15 2036

## 2015-07-04 NOTE — ED Notes (Addendum)
Pt reports that for the past 3 months she has been testing positive for cocaine. She denies ever using cocaine.  She would like to find out why this is happening.

## 2015-07-05 ENCOUNTER — Emergency Department
Admission: EM | Admit: 2015-07-05 | Discharge: 2015-07-05 | Disposition: A | Discharge status: Home / Self Care | Payer: Medicaid Other | Attending: Emergency Medicine | Admitting: Emergency Medicine

## 2015-07-05 ENCOUNTER — Emergency Department
Admission: EM | Admit: 2015-07-05 | Discharge: 2015-07-05 | Discharge status: Against Medical Advice | Payer: Medicaid Other | Attending: Emergency Medicine | Admitting: Emergency Medicine

## 2015-07-05 ENCOUNTER — Encounter: Payer: Self-pay | Admitting: *Deleted

## 2015-07-05 DIAGNOSIS — F1410 Cocaine abuse, uncomplicated: Secondary | ICD-10-CM | POA: Insufficient documentation

## 2015-07-05 DIAGNOSIS — R825 Elevated urine levels of drugs, medicaments and biological substances: Secondary | ICD-10-CM

## 2015-07-05 DIAGNOSIS — Z79899 Other long term (current) drug therapy: Secondary | ICD-10-CM | POA: Insufficient documentation

## 2015-07-05 DIAGNOSIS — F17210 Nicotine dependence, cigarettes, uncomplicated: Secondary | ICD-10-CM | POA: Insufficient documentation

## 2015-07-05 DIAGNOSIS — E860 Dehydration: Secondary | ICD-10-CM | POA: Insufficient documentation

## 2015-07-05 LAB — BASIC METABOLIC PANEL
Anion gap: 5 (ref 5–15)
BUN: 17 mg/dL (ref 6–20)
CALCIUM: 9.2 mg/dL (ref 8.9–10.3)
CHLORIDE: 108 mmol/L (ref 101–111)
CO2: 26 mmol/L (ref 22–32)
Creatinine, Ser: 0.98 mg/dL (ref 0.44–1.00)
GFR calc non Af Amer: >60 mL/min (ref >60)
Glucose, Bld: 95 mg/dL (ref 65–99)
Potassium: 3.9 mmol/L (ref 3.5–5.1)
Sodium: 139 mmol/L (ref 135–145)

## 2015-07-05 LAB — URINE DRUG SCREEN, QUALITATIVE (ARMC ONLY)
Amphetamines, Ur Screen: NOT DETECTED
BARBITURATES, UR SCREEN: NOT DETECTED
BENZODIAZEPINE, UR SCRN: NOT DETECTED
COCAINE METABOLITE, UR ~~LOC~~: POSITIVE — AB
Cannabinoid 50 Ng, Ur ~~LOC~~: NOT DETECTED
MDMA (Ecstasy)Ur Screen: NOT DETECTED
Methadone Scn, Ur: NOT DETECTED
OPIATE, UR SCREEN: NOT DETECTED
PHENCYCLIDINE (PCP) UR S: NOT DETECTED
Tricyclic, Ur Screen: NOT DETECTED

## 2015-07-05 LAB — CBC
HCT: 35.1 % (ref 35.0–47.0)
HEMOGLOBIN: 11.7 g/dL — AB (ref 12.0–16.0)
MCH: 27.2 pg (ref 26.0–34.0)
MCHC: 33.2 g/dL (ref 32.0–36.0)
MCV: 81.8 fL (ref 80.0–100.0)
Platelets: 229 10*3/uL (ref 150–440)
RBC: 4.28 MIL/uL (ref 3.80–5.20)
RDW: 14.4 % (ref 11.5–14.5)
WBC: 7.4 10*3/uL (ref 3.6–11.0)

## 2015-07-05 NOTE — ED Notes (Signed)
Pt seen leaving room 45 after being seen by PA.  Pt ambulatory, in NAD, accompanied by significant other

## 2015-07-05 NOTE — ED Provider Notes (Signed)
[facility] Emergency Department Provider Note  ____________________________________________  Time seen: Approximately 9:32 PM  I have reviewed the triage vital signs and the nursing notes.   HISTORY  Chief Complaint Drug / Alcohol Assessment    HPI [name] Young is a 30 y.o. female who was seen earlier today in the emergency room and tested positive for cocaine in her urine. She returns this evening asking for a note saying this is a false positive. She believes it is related to taking an herbal supplement. She is requesting documentation explaining why she tests positive even though her last use of cocaine was 3 months ago according to the patient.    Past Medical History  Diagnosis Date  . Anemia   . Severe major depression with psychotic features (HCC) 2012  . Auditory hallucination 2012  . UTI (urinary tract infection)   . Pica in adults   . Abnormal Pap smear of cervix     Patient Active Problem List   Diagnosis Date Noted  . Supervision of high risk pregnancy due to social problems, antepartum 10/04/2014  . High-risk pregnancy in third trimester 10/04/2014  . Labor and delivery, indication for care 10/04/2014  . Indication for care in labor or delivery 10/03/2014  . Indication for care in labor and delivery, antepartum 09/14/2014  . NST (non-stress test) reactive 09/14/2014  . Pregnant and not yet delivered 09/13/2014    Past Surgical History  Procedure Laterality Date  . Abdominal surgery    . Salpingectomy  2007    partial    Current Outpatient Rx  Name  Route  Sig  Dispense  Refill  . Prenatal Vit-Fe Fumarate-FA (MULTIVITAMIN-PRENATAL) 27-0.8 MG TABS tablet   Oral   Take 1 tablet by mouth daily at 12 noon.           Allergies Review of patient's allergies indicates no known allergies.  No family history on file.  Social History Social History  Substance Use Topics  . Smoking status: Current Every Day Smoker --  0.25 packs/day for 4 years    Types: Cigarettes  . Smokeless tobacco: Never Used  . Alcohol Use: No     Comment: occassional- in the past     Review of Systems    ____________________________________________   PHYSICAL EXAM:  VITAL SIGNS: ED Triage Vitals  Enc Vitals Group     BP 07/05/15 1938 101/90 mmHg     Pulse Rate 07/05/15 1938 68     Resp 07/05/15 1938 18     Temp 07/05/15 1938 98.6 F (37 C)     Temp Source 07/05/15 1938 Oral     SpO2 07/05/15 1938 100 %     Weight 07/05/15 1938 152 lb (68.947 kg)     Height 07/05/15 1938  (1.549 m)     Head Cir --      Peak Flow --      Pain Score 07/05/15 2113 0     Pain Loc --      Pain Edu? --      Excl. in GC? --     Constitutional: Alert and oriented. Well appearing and in no acute distress.  Psychiatric: Mood and affect are normal. Speech and behavior are normal.  ____________________________________________   LABS (all labs ordered are listed, but only abnormal results are displayed)  Labs Reviewed  URINE DRUG SCREEN, QUALITATIVE ([facility] ONLY) - Abnormal; Notable for the following:    Cocaine Metabolite,Ur [place] POSITIVE (*)  All other components within normal limits   ____________________________________________  EKG    ____________________________________________  RADIOLOGY    ____________________________________________   PROCEDURES  Procedure(s) performed: None  Critical Care performed: No  ____________________________________________   INITIAL IMPRESSION / ASSESSMENT AND PLAN / ED COURSE  Pertinent labs & imaging results that were available during my care of the patient were reviewed by me and considered in my medical decision making (see chart for details).  30 year old female who returns to the emergency room requesting documentation explaining what she believes to be a false positive urine cocaine test. She believes she took cocaine 3 months ago, and her test should no longer be  positive. She wanted a note expressing that the urine test is a false positive. I explained we could not offer this documentation. Even though it is possible to have a false positive test, she has had 2 in a row, yesterday and today. I explained we could not provide any other information other than what the test results are. The patient, at this point left the emergency room before she could be officially discharged. ____________________________________________   FINAL CLINICAL IMPRESSION(S) / ED DIAGNOSES  Final diagnoses:  None      Ignacia Bayley, PA-C 07/05/15 2140  Arnaldo Natal, MD 07/05/15 [phone]

## 2015-07-05 NOTE — ED Notes (Signed)
  Patient was asked to wait for discharge papers before leaving. She stated "I will be right back. I have to tell my husband where I am" Patient then walked out to lobby

## 2015-07-05 NOTE — Discharge Instructions (Signed)
Dehydration, Adult °Dehydration is a condition in which you do not have enough fluid or water in your body. It happens when you take in less fluid than you lose. Vital organs such as the kidneys, brain, and heart cannot function without a proper amount of fluids. Any loss of fluids from the body can cause dehydration.  °Dehydration can range from mild to severe. This condition should be treated right away to help prevent it from becoming severe. °CAUSES  °This condition may be caused by: °· Vomiting. °· Diarrhea. °· Excessive sweating, such as when exercising in hot or humid weather. °· Not drinking enough fluid during strenuous exercise or during an illness. °· Excessive urine output. °· Fever. °· Certain medicines. °RISK FACTORS °This condition is more likely to develop in: °· People who are taking certain medicines that cause the body to lose excess fluid (diuretics).   °· People who have a chronic illness, such as diabetes, that may increase urination. °· Older adults.   °· People who live at high altitudes.   °· People who participate in endurance sports.   °SYMPTOMS  °Mild Dehydration °· Thirst. °· Dry lips. °· Slightly dry mouth. °· Dry, warm skin. °Moderate Dehydration °· Very dry mouth.   °· Muscle cramps.   °· Dark urine and decreased urine production.   °· Decreased tear production.   °· Headache.   °· Light-headedness, especially when you stand up from a sitting position.   °Severe Dehydration °· Changes in skin.   °¨ Cold and clammy skin.   °¨ Skin does not spring back quickly when lightly pinched and released.   °· Changes in body fluids.   °¨ Extreme thirst.   °¨ No tears.   °¨ Not able to sweat when body temperature is high, such as in hot weather.   °¨ Minimal urine production.   °· Changes in vital signs.   °¨ Rapid, weak pulse (more than 100 beats per minute when you are sitting still).   °¨ Rapid breathing.   °¨ Low blood pressure.   °· Other changes.   °¨ Sunken eyes.   °¨ Cold hands and feet.    °¨ Confusion. °¨ Lethargy and difficulty being awakened. °¨ Fainting (syncope).   °¨ Short-term weight loss.   °¨ Unconsciousness. °DIAGNOSIS  °This condition may be diagnosed based on your symptoms. You may also have tests to determine how severe your dehydration is. These tests may include:  °· Urine tests.   °· Blood tests.   °TREATMENT  °Treatment for this condition depends on the severity. Mild or moderate dehydration can often be treated at home. Treatment should be started right away. Do not wait until dehydration becomes severe. Severe dehydration needs to be treated at the hospital. °Treatment for Mild Dehydration °· Drinking plenty of water to replace the fluid you have lost.   °· Replacing minerals in your blood (electrolytes) that you may have lost.   °Treatment for Moderate Dehydration  °· Consuming oral rehydration solution (ORS). °Treatment for Severe Dehydration °· Receiving fluid through an IV tube.   °· Receiving electrolyte solution through a feeding tube that is passed through your nose and into your stomach (nasogastric tube or NG tube). °· Correcting any abnormalities in electrolytes. °HOME CARE INSTRUCTIONS  °· Drink enough fluid to keep your urine clear or pale yellow.   °· Drink water or fluid slowly by taking small sips. You can also try sucking on ice cubes.  °· Have food or beverages that contain electrolytes. Examples include bananas and sports drinks. °· Take over-the-counter and prescription medicines only as told by your health care provider.   °· Prepare ORS according to the manufacturer's instructions. Take sips   of ORS every 5 minutes until your urine returns to normal. °· If you have vomiting or diarrhea, continue to try to drink water, ORS, or both.   °· If you have diarrhea, avoid:   °¨ Beverages that contain caffeine.   °¨ Fruit juice.   °¨ Milk.    °¨ Carbonated soft drinks. °· Do not take salt tablets. This can lead to the condition of having too much sodium in your body  (hypernatremia).   °SEEK MEDICAL CARE IF: °· You cannot eat or drink without vomiting. °· You have had moderate diarrhea during a period of more than 24 hours. °· You have a fever. °SEEK IMMEDIATE MEDICAL CARE IF:  °· You have extreme thirst. °· You have severe diarrhea. °· You have not urinated in 6-8 hours, or you have urinated only a small amount of very dark urine. °· You have shriveled skin. °· You are dizzy, confused, or both. °  °This information is not intended to replace advice given to you by your health care provider. Make sure you discuss any questions you have with your health care provider. °  °Document Released: 04/21/2005 Document Revised: 01/10/2015 Document Reviewed: 09/06/2014 °Elsevier Interactive Patient Education ©2016 [employer] ° °Rehydration, Adult °Rehydration is the replacement of body fluids lost during dehydration. Dehydration is an extreme loss of body fluids to the point of body function impairment. There are many ways extreme fluid loss can occur, including vomiting, diarrhea, or excess sweating. Recovering from dehydration requires replacing lost fluids, continuing to eat to maintain strength, and avoiding foods and beverages that may contribute to further fluid loss or may increase nausea. °HOW TO REHYDRATE °In most cases, rehydration involves the replacement of not only fluids but also carbohydrates and basic body salts. Rehydration with an oral rehydration solution is one way to replace essential nutrients lost through dehydration. °An oral rehydration solution can be purchased at pharmacies, retail stores, and online. Premixed packets of powder that you combine with water to make a solution are also sold. You can prepare an oral rehydration solution at home by mixing the following ingredients together:  °·  - tsp table salt. °· ¾ tsp baking soda. °·  tsp salt substitute containing potassium chloride. °· 1 tablespoons sugar. °· 1 L (34 oz) of water. °Be sure to use exact  measurements. Including too much sugar can make diarrhea worse. °Drink ½-1 cup (120-240 mL) of oral rehydration solution each time you have diarrhea or vomit. If drinking this amount makes your vomiting worse, try drinking smaller amounts more often. For example, drink 1-3 tsp every 5-10 minutes.  °A general rule for staying hydrated is to drink 1½-2 L of fluid per day. Talk to your caregiver about the specific amount you should be drinking each day. Drink enough fluids to keep your urine clear or pale yellow. °EATING WHEN DEHYDRATED °Even if you have had severe sweating or you are having diarrhea, do not stop eating. Many healthy items in a normal diet are okay to continue eating while recovering from dehydration. The following tips can help you to lessen nausea when you eat: °· Ask someone else to prepare your food. Cooking smells may worsen nausea. °· Eat in a well-ventilated room away from cooking smells. °· Sit up when you eat. Avoid lying down until 1-2 hours after eating. °· Eat small amounts when you eat. °· Eat foods that are easy to digest. These include soft, well-cooked, or mashed foods. °FOODS AND BEVERAGES TO AVOID °Avoid eating or drinking the following foods and   beverages that may increase nausea or further loss of fluid:  °· Fruit juices with a high sugar content, such as concentrated juices. °· Alcohol. °· Beverages containing caffeine. °· Carbonated drinks. They may cause a lot of gas. °· Foods that may cause a lot of gas, such as cabbage, broccoli, and beans. °· Fatty, greasy, and fried foods. °· Spicy, very salty, and very sweet foods or drinks. °· Foods or drinks that are very hot or very cold. Consume food or drinks at or near room temperature. °· Foods that need a lot of chewing, such as raw vegetables. °· Foods that are sticky or hard to swallow, such as peanut butter. °  °This information is not intended to replace advice given to you by your health care provider. Make sure you discuss any  questions you have with your health care provider. °  °Document Released: 07/14/2011 Document Revised: 01/14/2012 Document Reviewed: 07/14/2011 °Elsevier Interactive Patient Education ©2016 [employer]vier [employer]. ° °

## 2015-07-05 NOTE — ED Notes (Signed)
States dizziness, decreased urination, and fainting spells, states she feels like she is dehydrated, states she has been drinking a lot of alcohol and not much water, pt awake and alert in no distress

## 2015-07-05 NOTE — ED Notes (Signed)
PA at bedside.

## 2015-07-05 NOTE — ED Notes (Signed)
Pt state she used cocoaine about 2 days ago

## 2015-07-05 NOTE — ED Notes (Signed)
PAtient denies chest pain or SOB

## 2015-07-05 NOTE — ED Notes (Addendum)
Pt reports she drank a herbal supplement 2 weeks ago and continues to have a positive drug test.  Pt used cocaine approx 3 months ago.   Denies other drug use.

## 2015-07-05 NOTE — ED Provider Notes (Addendum)
[facility] Emergency Department Provider Note  ____________________________________________  Time seen: 2:30 PM  I have reviewed the triage vital signs and the nursing notes.   HISTORY  Chief Complaint Dehydration    HPI [name] Young is a 30 y.o. female who complains of decreased urination and dizziness over the past few days. She drinks a lot of alcohol and not much water. She feels like she drank a lot of fluids today because she drank about 16 ounces of [employer]. She also has been using cocaine most recently 2 days ago. Denies any current pain, no nausea vomiting or fevers or chills. No other complaints.     Past Medical History  Diagnosis Date  . Anemia   . Severe major depression with psychotic features (HCC) 2012  . Auditory hallucination 2012  . UTI (urinary tract infection)   . Pica in adults   . Abnormal Pap smear of cervix      Patient Active Problem List   Diagnosis Date Noted  . Supervision of high risk pregnancy due to social problems, antepartum 10/04/2014  . High-risk pregnancy in third trimester 10/04/2014  . Labor and delivery, indication for care 10/04/2014  . Indication for care in labor or delivery 10/03/2014  . Indication for care in labor and delivery, antepartum 09/14/2014  . NST (non-stress test) reactive 09/14/2014  . Pregnant and not yet delivered 09/13/2014     Past Surgical History  Procedure Laterality Date  . Abdominal surgery    . Salpingectomy  2007    partial     Current Outpatient Rx  Name  Route  Sig  Dispense  Refill  . Prenatal Vit-Fe Fumarate-FA (MULTIVITAMIN-PRENATAL) 27-0.8 MG TABS tablet   Oral   Take 1 tablet by mouth daily at 12 noon.            Allergies Review of patient's allergies indicates no known allergies.   History reviewed. No pertinent family history.  Social History Social History  Substance Use Topics  . Smoking status: Current Every Day Smoker -- 0.25  packs/day for 4 years    Types: Cigarettes  . Smokeless tobacco: Never Used  . Alcohol Use: No     Comment: occassional- in the past     Review of Systems  Constitutional:   No fever or chills. No weight changes. Positive for thirst Eyes:   No blurry vision or double vision.  ENT:   No sore throat.  Cardiovascular:   No chest pain. Respiratory:   No dyspnea or cough. Gastrointestinal:   Negative for abdominal pain, vomiting and diarrhea.  No BRBPR or melena. Genitourinary:   Positive for decreased urinary output Musculoskeletal:   Negative for back pain. No joint swelling or pain. Skin:   Negative for rash. Neurological:   Negative for headaches, focal weakness or numbness. Psychiatric:  No anxiety or depression.   Endocrine:  No changes in energy or sleep difficulty.  10-point ROS otherwise negative.  ____________________________________________   PHYSICAL EXAM:  VITAL SIGNS: ED Triage Vitals  Enc Vitals Group     BP 07/05/15 1139 128/90 mmHg     Pulse Rate 07/05/15 1139 54     Resp 07/05/15 1139 18     Temp 07/05/15 1139 98.6 F (37 C)     Temp src --      SpO2 07/05/15 1139 98 %     Weight 07/05/15 1139 153 lb (69.4 kg)     Height 07/05/15 1139 5' (1.524 m)  Head Cir --      Peak Flow --      Pain Score 07/05/15 1140 0     Pain Loc --      Pain Edu? --      Excl. in GC? --     Vital signs reviewed, nursing assessments reviewed.   Constitutional:   Alert and oriented. Well appearing and in no distress. Has an infant with her that she interacts with appropriately. Eyes:   No scleral icterus. No conjunctival pallor. PERRL. EOMI ENT   Head:   Normocephalic and atraumatic.   Nose:   No congestion/rhinnorhea. No septal hematoma   Mouth/Throat:   MMM, no pharyngeal erythema. No peritonsillar mass.    Neck:   No stridor. No SubQ emphysema. No meningismus. Hematological/Lymphatic/Immunilogical:   No cervical lymphadenopathy. Cardiovascular:    RRR. Symmetric bilateral radial and DP pulses.  No murmurs.  Respiratory:   Normal respiratory effort without tachypnea nor retractions. Breath sounds are clear and equal bilaterally. No wheezes/rales/rhonchi. Gastrointestinal:   Soft and nontender. Non distended. There is no CVA tenderness.  No rebound, rigidity, or guarding. Genitourinary:   deferred Musculoskeletal:   Nontender with normal range of motion in all extremities. No joint effusions.  No lower extremity tenderness.  No edema. Neurologic:   Normal speech and language.  CN 2-10 normal. Motor grossly intact. Ambulatory with steady gait No gross focal neurologic deficits are appreciated.  Skin:    Skin is warm, dry and intact. No rash noted.  No petechiae, purpura, or bullae. Psychiatric:   Mood and affect are normal. ____________________________________________    LABS (pertinent positives/negatives) (all labs ordered are listed, but only abnormal results are displayed) Labs Reviewed  CBC - Abnormal; Notable for the following:    Hemoglobin 11.7 (*)    All other components within normal limits  BASIC METABOLIC PANEL  URINALYSIS COMPLETEWITH MICROSCOPIC ([facility] ONLY)   ____________________________________________   EKG   EKG interpreted by me This bradycardia rate of 47, normal axis, normal intervals. Normal QRS and ST segments. There is an isolated T-wave inversion in V3 which is nonspecific ____________________________________________    RADIOLOGY    ____________________________________________   PROCEDURES   ____________________________________________   INITIAL IMPRESSION / ASSESSMENT AND PLAN / ED COURSE  Pertinent labs & imaging results that were available during my care of the patient were reviewed by me and considered in my medical decision making (see chart for details).  Patient complains of thirst and decreased urinary output. By history sounds like she drinks very little water and only  consumes alcohol and soda. I encouraged her to avoid caffeine and alcohol and drugs and to increase her intake of water or Gatorade or Pedialyte she is agreeable with this. She is otherwise well-appearing no acute distress, at baseline, normal vital signs, unremarkable labs.     ____________________________________________   FINAL CLINICAL IMPRESSION(S) / ED DIAGNOSES  Final diagnoses:  Dehydration      Sharman Cheek, MD 07/05/15 1444  Sharman Cheek, MD 07/05/15 1445

## 2015-07-31 ENCOUNTER — Emergency Department
Admission: EM | Admit: 2015-07-31 | Discharge: 2015-07-31 | Disposition: A | Discharge status: Home / Self Care | Payer: Self-pay | Attending: Emergency Medicine | Admitting: Emergency Medicine

## 2015-07-31 ENCOUNTER — Encounter: Payer: Self-pay | Admitting: Emergency Medicine

## 2015-07-31 DIAGNOSIS — Z79899 Other long term (current) drug therapy: Secondary | ICD-10-CM | POA: Insufficient documentation

## 2015-07-31 DIAGNOSIS — Z008 Encounter for other general examination: Secondary | ICD-10-CM | POA: Insufficient documentation

## 2015-07-31 DIAGNOSIS — Z139 Encounter for screening, unspecified: Secondary | ICD-10-CM

## 2015-07-31 DIAGNOSIS — F17210 Nicotine dependence, cigarettes, uncomplicated: Secondary | ICD-10-CM | POA: Insufficient documentation

## 2015-07-31 NOTE — ED Notes (Addendum)
Pt states needs to confirm home UDS negative for cocaine. Denies any s/s at this time. MD staffford at bedside along with RN

## 2015-07-31 NOTE — ED Provider Notes (Signed)
[facility] Emergency Department Provider Note  ____________________________________________  Time seen: 10:25 PM  I have reviewed the triage vital signs and the nursing notes.   HISTORY  Chief Complaint Drug / Alcohol Assessment    HPI [name] Young is a 30 y.o. female who comes to the ED requesting a confirmatory drug test. She is a home drug test which she states that it was negative for all drugs but the test line for cocaine with sphincter than expected. She reports in her test the direction state that 2 lines is indicates a negative result. With the second line being very faint on the cocaine test she wanted to double check and have us 3 drug test her to ensure that she will in fact test negative in the future were needed to know if she is somehow been in overtly exposed to cocaine. Her last known cocaine use per the patient was 4 months ago during which she also was drinking alcohol. She has not had any since. She's had some drinking since then but no smoking of anything, no other drug use.  Denies any acute symptoms.   Past Medical History  Diagnosis Date  . Anemia   . Severe major depression with psychotic features (HCC) 2012  . Auditory hallucination 2012  . UTI (urinary tract infection)   . Pica in adults   . Abnormal Pap smear of cervix      Patient Active Problem List   Diagnosis Date Noted  . Supervision of high risk pregnancy due to social problems, antepartum 10/04/2014  . High-risk pregnancy in third trimester 10/04/2014  . Labor and delivery, indication for care 10/04/2014  . Indication for care in labor or delivery 10/03/2014  . Indication for care in labor and delivery, antepartum 09/14/2014  . NST (non-stress test) reactive 09/14/2014  . Pregnant and not yet delivered 09/13/2014     Past Surgical History  Procedure Laterality Date  . Abdominal surgery    . Salpingectomy  2007    partial     Current Outpatient Rx   Name  Route  Sig  Dispense  Refill  . Prenatal Vit-Fe Fumarate-FA (MULTIVITAMIN-PRENATAL) 27-0.8 MG TABS tablet   Oral   Take 1 tablet by mouth daily at 12 noon.            Allergies Review of patient's allergies indicates no known allergies.   No family history on file.  Social History Social History  Substance Use Topics  . Smoking status: Current Every Day Smoker -- 0.25 packs/day for 4 years    Types: Cigarettes  . Smokeless tobacco: Never Used  . Alcohol Use: No     Comment: occassional- in the past     Review of Systems  Constitutional:   No fever or chills. No weight changes Cardiovascular:   No chest pain. Respiratory:   No dyspnea or cough. Gastrointestinal:   Negative for abdominal pain, vomiting and diarrhea.  No BRBPR or melena.  Neurological:   Negative for headaches, focal weakness or numbness.  10-point ROS otherwise negative.  ____________________________________________   PHYSICAL EXAM:  VITAL SIGNS: ED Triage Vitals  Enc Vitals Group     BP 07/31/15 2201 120/95 mmHg     Pulse Rate 07/31/15 2201 78     Resp 07/31/15 2201 20     Temp 07/31/15 2201 98.4 F (36.9 C)     Temp Source 07/31/15 2201 Oral     SpO2 07/31/15 2201 100 %  Weight 07/31/15 2201 157 lb (71.215 kg)     Height 07/31/15 2201  (1.575 m)     Head Cir --      Peak Flow --      Pain Score --      Pain Loc --      Pain Edu? --      Excl. in GC? --     Vital signs reviewed, nursing assessments reviewed.   Constitutional:   Alert and oriented. Well appearing and in no distress. Eyes:   No scleral icterus. ENT   Head:   Normocephalic and atraumatic. Neurologic:   Normal speech and language.  CN 2-10 normal. Motor grossly intact. No gross focal neurologic deficits are appreciated.  Psychiatric:   Mood and affect are normal. ____________________________________________    LABS (pertinent positives/negatives) (all labs ordered are listed, but only  abnormal results are displayed) Labs Reviewed - No data to display ____________________________________________   EKG    ____________________________________________    RADIOLOGY    ____________________________________________   PROCEDURES   ____________________________________________   INITIAL IMPRESSION / ASSESSMENT AND PLAN / ED COURSE  Pertinent labs & imaging results that were available during my care of the patient were reviewed by me and considered in my medical decision making (see chart for details).  Patient presents requesting drug testing to confirm that she'll have a negative result in the future. This is related to upcoming preemployment drug screening for the patient. She has no acute symptoms and vital signs are normal. I explained that this is not an emergency and not within the realm of emergency department care. I did extensively counsel her that our urine drug screen is going to be similar to hers and produce the same result especially since she used hers about 3 hours ago. Patient reports that according to her directions hers is actually produce a negative result.    She is overall well appearing not in distress ambulatory and asymptomatic.     ____________________________________________   FINAL CLINICAL IMPRESSION(S) / ED DIAGNOSES  Final diagnoses:  Encounter for medical screening examination      Sharman Cheek, MD 07/31/15 2235

## 2015-07-31 NOTE — ED Notes (Addendum)
Patient ambulatory to triage with steady gait, without difficulty or distress noted; pt reports wanting a drug test; denies any c/o or symptoms; st took a home test because her job is going to test her and it was positive for cocaine and wants to ensure that it is accurate; pt was seen here for same 3/1 and twice on 3/2

## 2015-09-11 ENCOUNTER — Emergency Department
Admission: EM | Admit: 2015-09-11 | Discharge: 2015-09-11 | Disposition: A | Discharge status: Home / Self Care | Payer: Medicaid Other | Attending: Emergency Medicine | Admitting: Emergency Medicine

## 2015-09-11 DIAGNOSIS — F17210 Nicotine dependence, cigarettes, uncomplicated: Secondary | ICD-10-CM | POA: Insufficient documentation

## 2015-09-11 DIAGNOSIS — F323 Major depressive disorder, single episode, severe with psychotic features: Secondary | ICD-10-CM | POA: Diagnosis not present

## 2015-09-11 DIAGNOSIS — Z5321 Procedure and treatment not carried out due to patient leaving prior to being seen by health care provider: Secondary | ICD-10-CM | POA: Diagnosis not present

## 2015-09-11 DIAGNOSIS — F329 Major depressive disorder, single episode, unspecified: Secondary | ICD-10-CM | POA: Diagnosis present

## 2015-09-11 NOTE — ED Notes (Signed)
Pt states she will follow up with her pmd tomorrow, [facility]. Pt was here with family member who is also here as a patient, states will not have ride once family member leaves.

## 2015-09-11 NOTE — ED Notes (Addendum)
Pt in with co feeling nervous and anxious, also feels depressed. Pt denies any SI or HI at this time states wants to see a psychiatrist or be be given outpt referrals hx of depression seen here for the same.

## 2015-10-31 ENCOUNTER — Encounter: Payer: Self-pay | Admitting: Emergency Medicine

## 2015-10-31 ENCOUNTER — Emergency Department
Admission: EM | Admit: 2015-10-31 | Discharge: 2015-10-31 | Disposition: A | Discharge status: Home / Self Care | Payer: Medicaid Other | Attending: Emergency Medicine | Admitting: Emergency Medicine

## 2015-10-31 DIAGNOSIS — F17210 Nicotine dependence, cigarettes, uncomplicated: Secondary | ICD-10-CM | POA: Insufficient documentation

## 2015-10-31 DIAGNOSIS — R140 Abdominal distension (gaseous): Secondary | ICD-10-CM | POA: Insufficient documentation

## 2015-10-31 DIAGNOSIS — F333 Major depressive disorder, recurrent, severe with psychotic symptoms: Secondary | ICD-10-CM | POA: Diagnosis not present

## 2015-10-31 LAB — URINALYSIS COMPLETE WITH MICROSCOPIC (ARMC ONLY)
BACTERIA UA: NONE SEEN
Bilirubin Urine: NEGATIVE
Glucose, UA: NEGATIVE mg/dL
HGB URINE DIPSTICK: NEGATIVE
Ketones, ur: NEGATIVE mg/dL
LEUKOCYTES UA: NEGATIVE
NITRITE: NEGATIVE
PH: 7 (ref 5.0–8.0)
PROTEIN: NEGATIVE mg/dL
SPECIFIC GRAVITY, URINE: 1.009 (ref 1.005–1.030)

## 2015-10-31 LAB — POCT PREGNANCY, URINE: PREG TEST UR: NEGATIVE

## 2015-10-31 MED ORDER — NAPROXEN 500 MG PO TABS: 500.0000 mg | ORAL_TABLET | Freq: Two times a day (BID) | ORAL | Status: DC

## 2015-10-31 NOTE — ED Notes (Signed)
Pt reports feeling bloated x3 days; reports last BM was today. Pt states "I also missed my period, so I could be pregnant". Pt denies taking pregnancy test at home.

## 2015-10-31 NOTE — ED Provider Notes (Signed)
[facility] Emergency Department Provider Note  ____________________________________________  Time seen: Approximately 5:23 PM  I have reviewed the triage vital signs and the nursing notes.   HISTORY  Chief Complaint Bloated and Possible Pregnancy    HPI [name] Young is a 30 y.o. female complains of irregular periods and feeling bloated for the last 3 days. Patient is concerned that she could be pregnant.   Past Medical History  Diagnosis Date  . Anemia   . Severe major depression with psychotic features (HCC) 2012  . Auditory hallucination 2012  . UTI (urinary tract infection)   . Pica in adults   . Abnormal Pap smear of cervix     Patient Active Problem List   Diagnosis Date Noted  . Supervision of high risk pregnancy due to social problems, antepartum 10/04/2014  . High-risk pregnancy in third trimester 10/04/2014  . Labor and delivery, indication for care 10/04/2014  . Indication for care in labor or delivery 10/03/2014  . Indication for care in labor and delivery, antepartum 09/14/2014  . NST (non-stress test) reactive 09/14/2014  . Pregnant and not yet delivered 09/13/2014    Past Surgical History  Procedure Laterality Date  . Abdominal surgery    . Salpingectomy  2007    partial    Current Outpatient Rx  Name  Route  Sig  Dispense  Refill  . naproxen (NAPROSYN) 500 MG tablet   Oral   Take 1 tablet (500 mg total) by mouth 2 (two) times daily with a meal.   60 tablet   0   . Prenatal Vit-Fe Fumarate-FA (MULTIVITAMIN-PRENATAL) 27-0.8 MG TABS tablet   Oral   Take 1 tablet by mouth daily at 12 noon.           Allergies Review of patient's allergies indicates no known allergies.  No family history on file.  Social History Social History  Substance Use Topics  . Smoking status: Current Every Day Smoker -- 0.25 packs/day for 4 years    Types: Cigarettes  . Smokeless tobacco: Never Used  . Alcohol Use: No   Comment: occassional- in the past     Review of Systems Constitutional: No fever/chills Cardiovascular: Denies chest pain. Respiratory: Denies shortness of breath. Gastrointestinal: No abdominal pain.  No nausea, no vomiting.  No diarrhea.  No constipation.As a for bloating. Genitourinary: Negative for dysuria. Musculoskeletal: Negative for back pain. Skin: Negative for rash. Neurological: Negative for headaches, focal weakness or numbness.  10-point ROS otherwise negative.  ____________________________________________   PHYSICAL EXAM: BP 108/68 mmHg  Pulse 59  Temp(Src) 99 F (37.2 C) (Oral)  Resp 20  Wt 68.811 kg  SpO2 100%  LMP 09/16/2015  VITAL SIGNS: ED Triage Vitals  Enc Vitals Group     BP --      Pulse --      Resp --      Temp --      Temp src --      SpO2 --      Weight --      Height --      Head Cir --      Peak Flow --      Pain Score --      Pain Loc --      Pain Edu? --      Excl. in GC? --     Constitutional: Alert and oriented. Well appearing and in no acute distress. Cardiovascular: Normal rate, regular rhythm. Grossly normal heart  sounds.  Good peripheral circulation. Respiratory: Normal respiratory effort.  No retractions. Lungs CTAB. Gastrointestinal: Soft and nontender. No distention. No abdominal bruits. No CVA tenderness. Musculoskeletal: No lower extremity tenderness nor edema.  No joint effusions. Neurologic:  Normal speech and language. No gross focal neurologic deficits are appreciated. No gait instability. Skin:  Skin is warm, dry and intact. No rash noted. Psychiatric: Mood and affect are normal. Speech and behavior are normal.  ____________________________________________   LABS (all labs ordered are listed, but only abnormal results are displayed)  Labs Reviewed  URINALYSIS COMPLETEWITH MICROSCOPIC ([facility] ONLY) - Abnormal; Notable for the following:    Color, Urine STRAW (*)    APPearance CLEAR (*)    Squamous Epithelial  / LPF 0-5 (*)    All other components within normal limits  POC URINE PREG, ED  POCT PREGNANCY, URINE   ____________________________________________   PROCEDURES  Procedure(s) performed: None  Critical Care performed: No  ____________________________________________   INITIAL IMPRESSION / ASSESSMENT AND PLAN / ED COURSE  Pertinent labs & imaging results that were available during my care of the patient were reviewed by me and considered in my medical decision making (see chart for details).  Nonspecific bloating. Maybe due to impending menses. Patient follow-up with health Department if anything changes or still no menses. Low-grade fever the patient reports not feeling sick at this time. ____________________________________________   FINAL CLINICAL IMPRESSION(S) / ED DIAGNOSES  Final diagnoses:  Bloating symptom     This chart was dictated using voice recognition software/Dragon. Despite best efforts to proofread, errors can occur which can change the meaning. Any change was purely unintentional.   Evangeline Dakinharles M Beers, PA-C 10/31/15 1831  Richardean Canalavid H Yao, MD 10/31/15 2101

## 2015-10-31 NOTE — Discharge Instructions (Signed)

## 2015-11-18 ENCOUNTER — Emergency Department: Payer: Medicaid Other

## 2015-11-18 DIAGNOSIS — Y929 Unspecified place or not applicable: Secondary | ICD-10-CM | POA: Insufficient documentation

## 2015-11-18 DIAGNOSIS — S0511XA Contusion of eyeball and orbital tissues, right eye, initial encounter: Secondary | ICD-10-CM | POA: Insufficient documentation

## 2015-11-18 DIAGNOSIS — S0512XA Contusion of eyeball and orbital tissues, left eye, initial encounter: Secondary | ICD-10-CM | POA: Insufficient documentation

## 2015-11-18 DIAGNOSIS — F323 Major depressive disorder, single episode, severe with psychotic features: Secondary | ICD-10-CM | POA: Insufficient documentation

## 2015-11-18 DIAGNOSIS — S060X9A Concussion with loss of consciousness of unspecified duration, initial encounter: Secondary | ICD-10-CM | POA: Insufficient documentation

## 2015-11-18 DIAGNOSIS — Y999 Unspecified external cause status: Secondary | ICD-10-CM | POA: Insufficient documentation

## 2015-11-18 DIAGNOSIS — Y939 Activity, unspecified: Secondary | ICD-10-CM | POA: Insufficient documentation

## 2015-11-18 DIAGNOSIS — F17210 Nicotine dependence, cigarettes, uncomplicated: Secondary | ICD-10-CM | POA: Insufficient documentation

## 2015-11-18 DIAGNOSIS — H1133 Conjunctival hemorrhage, bilateral: Secondary | ICD-10-CM | POA: Insufficient documentation

## 2015-11-18 MED ORDER — OXYCODONE-ACETAMINOPHEN 5-325 MG PO TABS
ORAL_TABLET | ORAL | Status: AC
  Filled 2015-11-18: qty 1

## 2015-11-18 MED ORDER — OXYCODONE-ACETAMINOPHEN 5-325 MG PO TABS
1.0000 | ORAL_TABLET | Freq: Once | ORAL | Status: AC
  Administered 2015-11-18: 1 via ORAL
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
## 2015-11-19 ENCOUNTER — Emergency Department
Admission: EM | Admit: 2015-11-19 | Discharge: 2015-11-19 | Disposition: A | Discharge status: Home / Self Care | Payer: Medicaid Other | Attending: Emergency Medicine | Admitting: Emergency Medicine

## 2015-11-19 DIAGNOSIS — S060X9A Concussion with loss of consciousness of unspecified duration, initial encounter: Secondary | ICD-10-CM

## 2015-11-19 DIAGNOSIS — H1133 Conjunctival hemorrhage, bilateral: Secondary | ICD-10-CM

## 2015-11-19 DIAGNOSIS — S0510XA Contusion of eyeball and orbital tissues, unspecified eye, initial encounter: Secondary | ICD-10-CM

## 2015-11-19 NOTE — ED Notes (Signed)
Pt states that after being given pain meds./ at this time she is in no pain/. Pt verbalized that she feels sleepy and lethargic since being given the medication "but it is tolerable."

## 2015-11-19 NOTE — Discharge Instructions (Signed)
Concussion, Adult To Whom It May Concern please be advised the patient received a Percocet in the emergency department for pain A concussion, or closed-head injury, is a brain injury caused by a direct blow to the head or by a quick and sudden movement (jolt) of the head or neck. Concussions are usually not life-threatening. Even so, the effects of a concussion can be serious. If you have had a concussion before, you are more likely to experience concussion-like symptoms after a direct blow to the head.  CAUSES  Direct blow to the head, such as from running into another player during a soccer game, being hit in a fight, or hitting your head on a hard surface.  A jolt of the head or neck that causes the brain to move back and forth inside the skull, such as in a car crash. SIGNS AND SYMPTOMS The signs of a concussion can be hard to notice. Early on, they may be missed by you, family members, and health care providers. You may look fine but act or feel differently. Symptoms are usually temporary, but they may last for days, weeks, or even longer. Some symptoms may appear right away while others may not show up for hours or days. Every head injury is different. Symptoms include:  Mild to moderate headaches that will not go away.  A feeling of pressure inside your head.  Having more trouble than usual:  Learning or remembering things you have heard.  Answering questions.  Paying attention or concentrating.  Organizing daily tasks.  Making decisions and solving problems.  Slowness in thinking, acting or reacting, speaking, or reading.  Getting lost or being easily confused.  Feeling tired all the time or lacking energy (fatigued).  Feeling drowsy.  Sleep disturbances.  Sleeping more than usual.  Sleeping less than usual.  Trouble falling asleep.  Trouble sleeping (insomnia).  Loss of balance or feeling lightheaded or dizzy.  Nausea or vomiting.  Numbness or  tingling.  Increased sensitivity to:  Sounds.  Lights.  Distractions.  Vision problems or eyes that tire easily.  Diminished sense of taste or smell.  Ringing in the ears.  Mood changes such as feeling sad or anxious.  Becoming easily irritated or angry for little or no reason.  Lack of motivation.  Seeing or hearing things other people do not see or hear (hallucinations). DIAGNOSIS Your health care provider can usually diagnose a concussion based on a description of your injury and symptoms. He or she will ask whether you passed out (lost consciousness) and whether you are having trouble remembering events that happened right before and during your injury. Your evaluation might include:  A brain scan to look for signs of injury to the brain. Even if the test shows no injury, you may still have a concussion.  Blood tests to be sure other problems are not present. TREATMENT  Concussions are usually treated in an emergency department, in urgent care, or at a clinic. You may need to stay in the hospital overnight for further treatment.  Tell your health care provider if you are taking any medicines, including prescription medicines, over-the-counter medicines, and natural remedies. Some medicines, such as blood thinners (anticoagulants) and aspirin, may increase the chance of complications. Also tell your health care provider whether you have had alcohol or are taking illegal drugs. This information may affect treatment.  Your health care provider will send you home with important instructions to follow.  How fast you will recover from a concussion  depends on many factors. These factors include how severe your concussion is, what part of your brain was injured, your age, and how healthy you were before the concussion.  Most people with mild injuries recover fully. Recovery can take time. In general, recovery is slower in older persons. Also, persons who have had a concussion in  the past or have other medical problems may find that it takes longer to recover from their current injury. HOME CARE INSTRUCTIONS General Instructions  Carefully follow the directions your health care provider gave you.  Only take over-the-counter or prescription medicines for pain, discomfort, or fever as directed by your health care provider.  Take only those medicines that your health care provider has approved.  Do not drink alcohol until your health care provider says you are well enough to do so. Alcohol and certain other drugs may slow your recovery and can put you at risk of further injury.  If it is harder than usual to remember things, write them down.  If you are easily distracted, try to do one thing at a time. For example, do not try to watch TV while fixing dinner.  Talk with family members or close friends when making important decisions.  Keep all follow-up appointments. Repeated evaluation of your symptoms is recommended for your recovery.  Watch your symptoms and tell others to do the same. Complications sometimes occur after a concussion. Older adults with a brain injury may have a higher risk of serious complications, such as a blood clot on the brain.  Tell your teachers, school nurse, school counselor, coach, athletic trainer, or work Production designer, theatre/television/filmmanager about your injury, symptoms, and restrictions. Tell them about what you can or cannot do. They should watch for:  Increased problems with attention or concentration.  Increased difficulty remembering or learning new information.  Increased time needed to complete tasks or assignments.  Increased irritability or decreased ability to cope with stress.  Increased symptoms.  Rest. Rest helps the brain to heal. Make sure you:  Get plenty of sleep at night. Avoid staying up late at night.  Keep the same bedtime hours on weekends and weekdays.  Rest during the day. Take daytime naps or rest breaks when you feel  tired.  Limit activities that require a lot of thought or concentration. These include:  Doing homework or job-related work.  Watching TV.  Working on the computer.  Avoid any situation where there is potential for another head injury (football, hockey, soccer, basketball, martial arts, downhill snow sports and horseback riding). Your condition will get worse every time you experience a concussion. You should avoid these activities until you are evaluated by the appropriate follow-up health care providers. Returning To Your Regular Activities You will need to return to your normal activities slowly, not all at once. You must give your body and brain enough time for recovery.  Do not return to sports or other athletic activities until your health care provider tells you it is safe to do so.  Ask your health care provider when you can drive, ride a bicycle, or operate heavy machinery. Your ability to react may be slower after a brain injury. Never do these activities if you are dizzy.  Ask your health care provider about when you can return to work or school. Preventing Another Concussion It is very important to avoid another brain injury, especially before you have recovered. In rare cases, another injury can lead to permanent brain damage, brain swelling, or death. The risk  of this is greatest during the first 7-10 days after a head injury. Avoid injuries by:  Wearing a seat belt when riding in a car.  Drinking alcohol only in moderation.  Wearing a helmet when biking, skiing, skateboarding, skating, or doing similar activities.  Avoiding activities that could lead to a second concussion, such as contact or recreational sports, until your health care provider says it is okay.  Taking safety measures in your home.  Remove clutter and tripping hazards from floors and stairways.  Use grab bars in bathrooms and handrails by stairs.  Place non-slip mats on floors and in  bathtubs.  Improve lighting in dim areas. SEEK MEDICAL CARE IF:  You have increased problems paying attention or concentrating.  You have increased difficulty remembering or learning new information.  You need more time to complete tasks or assignments than before.  You have increased irritability or decreased ability to cope with stress.  You have more symptoms than before. Seek medical care if you have any of the following symptoms for more than 2 weeks after your injury:  Lasting (chronic) headaches.  Dizziness or balance problems.  Nausea.  Vision problems.  Increased sensitivity to noise or light.  Depression or mood swings.  Anxiety or irritability.  Memory problems.  Difficulty concentrating or paying attention.  Sleep problems.  Feeling tired all the time. SEEK IMMEDIATE MEDICAL CARE IF:  You have severe or worsening headaches. These may be a sign of a blood clot in the brain.  You have weakness (even if only in one hand, leg, or part of the face).  You have numbness.  You have decreased coordination.  You vomit repeatedly.  You have increased sleepiness.  One pupil is larger than the other.  You have convulsions.  You have slurred speech.  You have increased confusion. This may be a sign of a blood clot in the brain.  You have increased restlessness, agitation, or irritability.  You are unable to recognize people or places.  You have neck pain.  It is difficult to wake you up.  You have unusual behavior changes.  You lose consciousness. MAKE SURE YOU:  Understand these instructions.  Will watch your condition.  Will get help right away if you are not doing well or get worse.   This information is not intended to replace advice given to you by your health care provider. Make sure you discuss any questions you have with your health care provider.   Document Released: 07/12/2003 Document Revised: 05/12/2014 Document Reviewed:  11/11/2012 Elsevier Interactive Patient Education 2016 [employer]  Eye Contusion An eye contusion is a deep bruise of the eye. This is often called a "black eye." Contusions are the result of an injury that caused bleeding under the skin. The contusion may turn blue, purple, or yellow. Minor injuries will give you a painless contusion, but more severe contusions may stay painful and swollen for a few weeks. If the eye contusion only involves the eyelids and tissues around the eye, the injured area will get better within a few days to weeks. However, eye contusions can be serious and affect the eyeball and sight. CAUSES   Blunt injury or trauma to the face or eye area.  A forehead injury that causes the blood under the skin to work its way down to the eyelids.  Rubbing the eyes due to irritation. SYMPTOMS   Swelling and redness around the eye.  Bruising around the eye.  Tenderness, soreness, or pain  around the eye.  Blurry vision.  Tearing.  Eyeball redness. DIAGNOSIS  A diagnosis is usually based on a thorough exam of the eye and surrounding area. The eye must be looked at carefully to make sure it is not injured and to make sure nothing else will threaten your vision. A vision test may be done. An X-ray or computed tomography (CT) scan may be needed to determine if there are any associated injuries, such as broken bones (fractures). TREATMENT  If there is an injury to the eye, treatment will be determined by the nature of the injury. HOME CARE INSTRUCTIONS   Put ice on the injured area.  Put ice in a plastic bag.  Place a towel between your skin and the bag.  Leave the ice on for 15-20 minutes, 03-04 times a day.  If it is determined that there is no injury to the eye, you may continue normal activities.  Sunglasses may be worn to protect your eyes from bright light if light is uncomfortable.  Sleep with your head elevated. You can put an extra pillow under your head.  This may help with discomfort.  Only take over-the-counter or prescription medicines for pain, discomfort, or fever as directed by your caregiver. Do not take aspirin for the first few days. This may increase bruising. SEEK IMMEDIATE MEDICAL CARE IF:   You have any form of vision loss.  You have double vision.  You feel nauseous.  You feel dizzy, sleepy, or like you will faint.  You have any fluid discharge from the eye or your nose.  You have swelling and discoloration that does not fade. MAKE SURE YOU:   Understand these instructions.  Will watch your condition.  Will get help right away if you are not doing well or get worse.   This information is not intended to replace advice given to you by your health care provider. Make sure you discuss any questions you have with your health care provider.   Document Released: 04/18/2000 Document Revised: 07/14/2011 Document Reviewed: 12/26/2014 Elsevier Interactive Patient Education 2016 [employer]  Subconjunctival Hemorrhage Subconjunctival hemorrhage is bleeding that happens between the white part of your eye (sclera) and the clear membrane that covers the outside of your eye (conjunctiva). There are many tiny blood vessels near the surface of your eye. A subconjunctival hemorrhage happens when one or more of these vessels breaks and bleeds, causing a red patch to appear on your eye. This is similar to a bruise. Depending on the amount of bleeding, the red patch may only cover a small area of your eye or it may cover the entire visible part of the sclera. If a lot of blood collects under the conjunctiva, there may also be swelling. Subconjunctival hemorrhages do not affect your vision or cause pain, but your eye may feel irritated if there is swelling. Subconjunctival hemorrhages usually do not require treatment, and they disappear on their own within two weeks. CAUSES This condition may be caused by:  Mild trauma, such as rubbing  your eye too hard.  Severe trauma or blunt injuries.  Coughing, sneezing, or vomiting.  Straining, such as when lifting a heavy object.  High blood pressure.  Recent eye surgery.  A history of diabetes.  Certain medicines, especially blood thinners (anticoagulants).  Other conditions, such as eye tumors, bleeding disorders, or blood vessel abnormalities. Subconjunctival hemorrhages can happen without an obvious cause.  SYMPTOMS  Symptoms of this condition include:  A bright red or dark  red patch on the white part of the eye.  The red area may spread out to cover a larger area of the eye before it goes away.  The red area may turn brownish-yellow before it goes away.  Swelling.  Mild eye irritation. DIAGNOSIS This condition is diagnosed with a physical exam. If your subconjunctival hemorrhage was caused by trauma, your health care provider may refer you to an eye specialist (ophthalmologist) or another specialist to check for other injuries. You may have other tests, including:  An eye exam.  A blood pressure check.  Blood tests to check for bleeding disorders. If your subconjunctival hemorrhage was caused by trauma, X-rays or a CT scan may be done to check for other injuries. TREATMENT Usually, no treatment is needed. Your health care provider may recommend eye drops or cold compresses to help with discomfort. HOME CARE INSTRUCTIONS  Take over-the-counter and prescription medicines only as directed by your health care provider.  Use eye drops or cold compresses to help with discomfort as directed by your health care provider.  Avoid activities, things, and environments that may irritate or injure your eye.  Keep all follow-up visits as told by your health care provider. This is important. SEEK MEDICAL CARE IF:  You have pain in your eye.  The bleeding does not go away within 3 weeks.  You keep getting new subconjunctival hemorrhages. SEEK IMMEDIATE MEDICAL  CARE IF:  Your vision changes or you have difficulty seeing.  You suddenly develop severe sensitivity to light.  You develop a severe headache, persistent vomiting, confusion, or abnormal tiredness (lethargy).  Your eye seems to bulge or protrude from your eye socket.  You develop unexplained bruises on your body.  You have unexplained bleeding in another area of your body.   This information is not intended to replace advice given to you by your health care provider. Make sure you discuss any questions you have with your health care provider.   Document Released: 04/21/2005 Document Revised: 01/10/2015 Document Reviewed: 06/28/2014 Elsevier Interactive Patient Education [employer]
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
## 2015-11-21 ENCOUNTER — Encounter: Payer: Self-pay | Admitting: Emergency Medicine

## 2015-11-21 ENCOUNTER — Emergency Department
Admission: EM | Admit: 2015-11-21 | Discharge: 2015-11-21 | Disposition: A | Discharge status: Home / Self Care | Payer: Medicaid Other | Attending: Emergency Medicine | Admitting: Emergency Medicine

## 2015-11-21 DIAGNOSIS — F17210 Nicotine dependence, cigarettes, uncomplicated: Secondary | ICD-10-CM | POA: Insufficient documentation

## 2015-11-21 DIAGNOSIS — Y939 Activity, unspecified: Secondary | ICD-10-CM | POA: Insufficient documentation

## 2015-11-21 DIAGNOSIS — Y999 Unspecified external cause status: Secondary | ICD-10-CM | POA: Insufficient documentation

## 2015-11-21 DIAGNOSIS — O99331 Smoking (tobacco) complicating pregnancy, first trimester: Secondary | ICD-10-CM | POA: Insufficient documentation

## 2015-11-21 DIAGNOSIS — Z3A00 Weeks of gestation of pregnancy not specified: Secondary | ICD-10-CM | POA: Insufficient documentation

## 2015-11-21 DIAGNOSIS — S0083XA Contusion of other part of head, initial encounter: Secondary | ICD-10-CM | POA: Insufficient documentation

## 2015-11-21 DIAGNOSIS — Y929 Unspecified place or not applicable: Secondary | ICD-10-CM | POA: Insufficient documentation

## 2015-11-21 DIAGNOSIS — O9A211 Injury, poisoning and certain other consequences of external causes complicating pregnancy, first trimester: Secondary | ICD-10-CM | POA: Insufficient documentation

## 2015-11-21 DIAGNOSIS — F323 Major depressive disorder, single episode, severe with psychotic features: Secondary | ICD-10-CM | POA: Insufficient documentation

## 2015-11-21 DIAGNOSIS — Z3491 Encounter for supervision of normal pregnancy, unspecified, first trimester: Secondary | ICD-10-CM

## 2015-11-21 LAB — COMPREHENSIVE METABOLIC PANEL
ALK PHOS: 60 U/L (ref 38–126)
ALT: 30 U/L (ref 14–54)
AST: 58 U/L — AB (ref 15–41)
Albumin: 4.1 g/dL (ref 3.5–5.0)
Anion gap: 6 (ref 5–15)
BUN: 10 mg/dL (ref 6–20)
CALCIUM: 9.1 mg/dL (ref 8.9–10.3)
CHLORIDE: 105 mmol/L (ref 101–111)
CO2: 26 mmol/L (ref 22–32)
CREATININE: 0.7 mg/dL (ref 0.44–1.00)
GFR calc Af Amer: >60 mL/min (ref >60)
GFR calc non Af Amer: >60 mL/min (ref >60)
GLUCOSE: 97 mg/dL (ref 65–99)
Potassium: 4 mmol/L (ref 3.5–5.1)
SODIUM: 137 mmol/L (ref 135–145)
Total Bilirubin: 0.5 mg/dL (ref 0.3–1.2)
Total Protein: 7.6 g/dL (ref 6.5–8.1)

## 2015-11-21 LAB — CBC WITH DIFFERENTIAL/PLATELET
BASOS ABS: 0 10*3/uL (ref 0–0.1)
Basophils Relative: 0 %
EOS ABS: 0.1 10*3/uL (ref 0–0.7)
EOS PCT: 1 %
HCT: 35.8 % (ref 35.0–47.0)
HEMOGLOBIN: 12 g/dL (ref 12.0–16.0)
LYMPHS ABS: 1.9 10*3/uL (ref 1.0–3.6)
LYMPHS PCT: 23 %
MCH: 28.3 pg (ref 26.0–34.0)
MCHC: 33.6 g/dL (ref 32.0–36.0)
MCV: 84.2 fL (ref 80.0–100.0)
Monocytes Absolute: 0.5 10*3/uL (ref 0.2–0.9)
Monocytes Relative: 6 %
NEUTROS PCT: 70 %
Neutro Abs: 5.8 10*3/uL (ref 1.4–6.5)
PLATELETS: 245 10*3/uL (ref 150–440)
RBC: 4.25 MIL/uL (ref 3.80–5.20)
RDW: 13.6 % (ref 11.5–14.5)
WBC: 8.3 10*3/uL (ref 3.6–11.0)

## 2015-11-21 LAB — POCT PREGNANCY, URINE: Preg Test, Ur: POSITIVE — AB

## 2015-11-21 LAB — HCG, QUANTITATIVE, PREGNANCY: HCG, BETA CHAIN, QUANT, S: 13029 m[IU]/mL — AB (ref <5)

## 2015-11-21 MED ORDER — METRONIDAZOLE 500 MG PO TABS: 500.0000 mg | ORAL_TABLET | Freq: Two times a day (BID) | ORAL | Status: DC

## 2015-11-21 MED ORDER — AZITHROMYCIN 500 MG PO TABS
1000.0000 mg | ORAL_TABLET | Freq: Once | ORAL | Status: AC
  Administered 2015-11-21: 1000 mg via ORAL
  Filled 2015-11-21: qty 2

## 2015-11-21 MED ORDER — CEFTRIAXONE SODIUM 250 MG IJ SOLR
250.0000 mg | INTRAMUSCULAR | Status: DC
  Administered 2015-11-21: 250 mg via INTRAMUSCULAR
  Filled 2015-11-21 (×2): qty 250
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
## 2015-11-21 NOTE — Discharge Instructions (Signed)
First Trimester of Pregnancy The first trimester of pregnancy is from week 1 until the end of week 12 (months 1 through 3). A week after a sperm fertilizes an egg, the egg will implant on the wall of the uterus. This embryo will begin to develop into a baby. Genes from you and your partner are forming the baby. The female genes determine whether the baby is a boy or a girl. At 6-8 weeks, the eyes and face are formed, and the heartbeat can be seen on ultrasound. At the end of 12 weeks, all the baby's organs are formed.  Now that you are pregnant, you will want to do everything you can to have a healthy baby. Two of the most important things are to get good prenatal care and to follow your health care provider's instructions. Prenatal care is all the medical care you receive before the baby's birth. This care will help prevent, find, and treat any problems during the pregnancy and childbirth. BODY CHANGES Your body goes through many changes during pregnancy. The changes vary from woman to woman.   You may gain or lose a couple of pounds at first.  You may feel sick to your stomach (nauseous) and throw up (vomit). If the vomiting is uncontrollable, call your health care provider.  You may tire easily.  You may develop headaches that can be relieved by medicines approved by your health care provider.  You may urinate more often. Painful urination may mean you have a bladder infection.  You may develop heartburn as a result of your pregnancy.  You may develop constipation because certain hormones are causing the muscles that push waste through your intestines to slow down.  You may develop hemorrhoids or swollen, bulging veins (varicose veins).  Your breasts may begin to grow larger and become tender. Your nipples may stick out more, and the tissue that surrounds them (areola) may become darker.  Your gums may bleed and may be sensitive to brushing and flossing.  Dark spots or blotches (chloasma,  mask of pregnancy) may develop on your face. This will likely fade after the baby is born.  Your menstrual periods will stop.  You may have a loss of appetite.  You may develop cravings for certain kinds of food.  You may have changes in your emotions from day to day, such as being excited to be pregnant or being concerned that something may go wrong with the pregnancy and baby.  You may have more vivid and strange dreams.  You may have changes in your hair. These can include thickening of your hair, rapid growth, and changes in texture. Some women also have hair loss during or after pregnancy, or hair that feels dry or thin. Your hair will most likely return to normal after your baby is born. WHAT TO EXPECT AT YOUR PRENATAL VISITS During a routine prenatal visit:  You will be weighed to make sure you and the baby are growing normally.  Your blood pressure will be taken.  Your abdomen will be measured to track your baby's growth.  The fetal heartbeat will be listened to starting around week 10 or 12 of your pregnancy.  Test results from any previous visits will be discussed. Your health care provider may ask you:  How you are feeling.  If you are feeling the baby move.  If you have had any abnormal symptoms, such as leaking fluid, bleeding, severe headaches, or abdominal cramping.  If you are using any tobacco products,   including cigarettes, chewing tobacco, and electronic cigarettes.  If you have any questions. Other tests that may be performed during your first trimester include:  Blood tests to find your blood type and to check for the presence of any previous infections. They will also be used to check for low iron levels (anemia) and Rh antibodies. Later in the pregnancy, blood tests for diabetes will be done along with other tests if problems develop.  Urine tests to check for infections, diabetes, or protein in the urine.  An ultrasound to confirm the proper growth  and development of the baby.  An amniocentesis to check for possible genetic problems.  Fetal screens for spina bifida and Down syndrome.  You may need other tests to make sure you and the baby are doing well.  HIV (human immunodeficiency virus) testing. Routine prenatal testing includes screening for HIV, unless you choose not to have this test. HOME CARE INSTRUCTIONS  Medicines  Follow your health care provider's instructions regarding medicine use. Specific medicines may be either safe or unsafe to take during pregnancy.  Take your prenatal vitamins as directed.  If you develop constipation, try taking a stool softener if your health care provider approves. Diet  Eat regular, well-balanced meals. Choose a variety of foods, such as meat or vegetable-based protein, fish, milk and low-fat dairy products, vegetables, fruits, and whole grain breads and cereals. Your health care provider will help you determine the amount of weight gain that is right for you.  Avoid raw meat and uncooked cheese. These carry germs that can cause birth defects in the baby.  Eating four or five small meals rather than three large meals a day may help relieve nausea and vomiting. If you start to feel nauseous, eating a few soda crackers can be helpful. Drinking liquids between meals instead of during meals also seems to help nausea and vomiting.  If you develop constipation, eat more high-fiber foods, such as fresh vegetables or fruit and whole grains. Drink enough fluids to keep your urine clear or pale yellow. Activity and Exercise  Exercise only as directed by your health care provider. Exercising will help you:  Control your weight.  Stay in shape.  Be prepared for labor and delivery.  Experiencing pain or cramping in the lower abdomen or low back is a good sign that you should stop exercising. Check with your health care provider before continuing normal exercises.  Try to avoid standing for long  periods of time. Move your legs often if you must stand in one place for a long time.  Avoid heavy lifting.  Wear low-heeled shoes, and practice good posture.  You may continue to have sex unless your health care provider directs you otherwise. Relief of Pain or Discomfort  Wear a good support bra for breast tenderness.   Take warm sitz baths to soothe any pain or discomfort caused by hemorrhoids. Use hemorrhoid cream if your health care provider approves.   Rest with your legs elevated if you have leg cramps or low back pain.  If you develop varicose veins in your legs, wear support hose. Elevate your feet for 15 minutes, 3-4 times a day. Limit salt in your diet. Prenatal Care  Schedule your prenatal visits by the twelfth week of pregnancy. They are usually scheduled monthly at first, then more often in the last 2 months before delivery.  Write down your questions. Take them to your prenatal visits.  Keep all your prenatal visits as directed by your   health care provider. Safety  Wear your seat belt at all times when driving.  Make a list of emergency phone numbers, including numbers for family, friends, the hospital, and police and fire departments. General Tips  Ask your health care provider for a referral to a local prenatal education class. Begin classes no later than at the beginning of month 6 of your pregnancy.  Ask for help if you have counseling or nutritional needs during pregnancy. Your health care provider can offer advice or refer you to specialists for help with various needs.  Do not use hot tubs, steam rooms, or saunas.  Do not douche or use tampons or scented sanitary pads.  Do not cross your legs for long periods of time.  Avoid cat litter boxes and soil used by cats. These carry germs that can cause birth defects in the baby and possibly loss of the fetus by miscarriage or stillbirth.  Avoid all smoking, herbs, alcohol, and medicines not prescribed by  your health care provider. Chemicals in these affect the formation and growth of the baby.  Do not use any tobacco products, including cigarettes, chewing tobacco, and electronic cigarettes. If you need help quitting, ask your health care provider. You may receive counseling support and other resources to help you quit.  Schedule a dentist appointment. At home, brush your teeth with a soft toothbrush and be gentle when you floss. SEEK MEDICAL CARE IF:   You have dizziness.  You have mild pelvic cramps, pelvic pressure, or nagging pain in the abdominal area.  You have persistent nausea, vomiting, or diarrhea.  You have a bad smelling vaginal discharge.  You have pain with urination.  You notice increased swelling in your face, hands, legs, or ankles. SEEK IMMEDIATE MEDICAL CARE IF:   You have a fever.  You are leaking fluid from your vagina.  You have spotting or bleeding from your vagina.  You have severe abdominal cramping or pain.  You have rapid weight gain or loss.  You vomit blood or material that looks like coffee grounds.  You are exposed to German measles and have never had them.  You are exposed to fifth disease or chickenpox.  You develop a severe headache.  You have shortness of breath.  You have any kind of trauma, such as from a fall or a car accident.   This information is not intended to replace advice given to you by your health care provider. Make sure you discuss any questions you have with your health care provider.   Document Released: 04/15/2001 Document Revised: 05/12/2014 Document Reviewed: 03/01/2013 Elsevier Interactive Patient Education 2016 [employer]  
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
## 2015-11-23 DIAGNOSIS — Y9302 Activity, running: Secondary | ICD-10-CM | POA: Insufficient documentation

## 2015-11-23 DIAGNOSIS — Y929 Unspecified place or not applicable: Secondary | ICD-10-CM | POA: Insufficient documentation

## 2015-11-23 DIAGNOSIS — F17210 Nicotine dependence, cigarettes, uncomplicated: Secondary | ICD-10-CM | POA: Insufficient documentation

## 2015-11-23 DIAGNOSIS — O26851 Spotting complicating pregnancy, first trimester: Secondary | ICD-10-CM | POA: Insufficient documentation

## 2015-11-23 DIAGNOSIS — W1839XA Other fall on same level, initial encounter: Secondary | ICD-10-CM | POA: Insufficient documentation

## 2015-11-23 DIAGNOSIS — O99331 Smoking (tobacco) complicating pregnancy, first trimester: Secondary | ICD-10-CM | POA: Insufficient documentation

## 2015-11-23 DIAGNOSIS — Y999 Unspecified external cause status: Secondary | ICD-10-CM | POA: Insufficient documentation

## 2015-11-23 DIAGNOSIS — Z3A01 Less than 8 weeks gestation of pregnancy: Secondary | ICD-10-CM | POA: Insufficient documentation

## 2015-11-23 DIAGNOSIS — R102 Pelvic and perineal pain: Secondary | ICD-10-CM | POA: Insufficient documentation

## 2015-11-23 LAB — CBC
HCT: 35.5 % (ref 35.0–47.0)
HEMOGLOBIN: 12 g/dL (ref 12.0–16.0)
MCH: 28.1 pg (ref 26.0–34.0)
MCHC: 33.7 g/dL (ref 32.0–36.0)
MCV: 83.3 fL (ref 80.0–100.0)
Platelets: 227 10*3/uL (ref 150–440)
RBC: 4.26 MIL/uL (ref 3.80–5.20)
RDW: 13.6 % (ref 11.5–14.5)
WBC: 9.3 10*3/uL (ref 3.6–11.0)

## 2015-11-23 LAB — COMPREHENSIVE METABOLIC PANEL
ALK PHOS: 64 U/L (ref 38–126)
ALT: 25 U/L (ref 14–54)
ANION GAP: 7 (ref 5–15)
AST: 28 U/L (ref 15–41)
Albumin: 4.2 g/dL (ref 3.5–5.0)
BUN: 18 mg/dL (ref 6–20)
CALCIUM: 9.2 mg/dL (ref 8.9–10.3)
CO2: 23 mmol/L (ref 22–32)
Chloride: 106 mmol/L (ref 101–111)
Creatinine, Ser: 0.83 mg/dL (ref 0.44–1.00)
GFR calc non Af Amer: >60 mL/min (ref >60)
Glucose, Bld: 91 mg/dL (ref 65–99)
Potassium: 3.8 mmol/L (ref 3.5–5.1)
SODIUM: 136 mmol/L (ref 135–145)
TOTAL PROTEIN: 7.5 g/dL (ref 6.5–8.1)
Total Bilirubin: 0.3 mg/dL (ref 0.3–1.2)

## 2015-11-23 LAB — LIPASE, BLOOD: Lipase: 26 U/L (ref 11–51)
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
## 2015-11-24 ENCOUNTER — Emergency Department: Payer: Self-pay

## 2015-11-24 ENCOUNTER — Encounter: Payer: Self-pay | Admitting: Emergency Medicine

## 2015-11-24 ENCOUNTER — Emergency Department
Admission: EM | Admit: 2015-11-24 | Discharge: 2015-11-24 | Disposition: A | Discharge status: Home / Self Care | Payer: Self-pay | Attending: Emergency Medicine | Admitting: Emergency Medicine

## 2015-11-24 DIAGNOSIS — Z3491 Encounter for supervision of normal pregnancy, unspecified, first trimester: Secondary | ICD-10-CM

## 2015-11-24 DIAGNOSIS — O4690 Antepartum hemorrhage, unspecified, unspecified trimester: Secondary | ICD-10-CM

## 2015-11-24 DIAGNOSIS — N939 Abnormal uterine and vaginal bleeding, unspecified: Secondary | ICD-10-CM

## 2015-11-24 DIAGNOSIS — W19XXXA Unspecified fall, initial encounter: Secondary | ICD-10-CM

## 2015-11-24 LAB — URINALYSIS COMPLETE WITH MICROSCOPIC (ARMC ONLY)
BILIRUBIN URINE: NEGATIVE
Bacteria, UA: NONE SEEN
GLUCOSE, UA: NEGATIVE mg/dL
Hgb urine dipstick: NEGATIVE
KETONES UR: NEGATIVE mg/dL
Leukocytes, UA: NEGATIVE
NITRITE: NEGATIVE
PROTEIN: NEGATIVE mg/dL
RBC / HPF: NONE SEEN RBC/hpf (ref 0–5)
SPECIFIC GRAVITY, URINE: 1.027 (ref 1.005–1.030)
pH: 5 (ref 5.0–8.0)

## 2015-11-24 LAB — HCG, QUANTITATIVE, PREGNANCY: hCG, Beta Chain, Quant, S: 21932 m[IU]/mL — ABNORMAL HIGH (ref <5)

## 2015-11-24 LAB — POCT PREGNANCY, URINE: Preg Test, Ur: POSITIVE — AB
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
## 2016-01-15 ENCOUNTER — Emergency Department
Admission: EM | Admit: 2016-01-15 | Discharge: 2016-01-15 | Payer: Medicaid Other | Attending: Emergency Medicine | Admitting: Emergency Medicine

## 2016-01-15 ENCOUNTER — Inpatient Hospital Stay
Admission: RE | Admit: 2016-01-15 | Discharge: 2016-01-18 | DRG: 781 | Disposition: A | Discharge status: Home / Self Care | Payer: Medicaid Other | Source: Intra-hospital | Attending: Psychiatry | Admitting: Psychiatry

## 2016-01-15 ENCOUNTER — Encounter: Payer: Self-pay | Admitting: Emergency Medicine

## 2016-01-15 DIAGNOSIS — G4700 Insomnia, unspecified: Secondary | ICD-10-CM | POA: Diagnosis present

## 2016-01-15 DIAGNOSIS — F1420 Cocaine dependence, uncomplicated: Secondary | ICD-10-CM | POA: Diagnosis present

## 2016-01-15 DIAGNOSIS — Z87891 Personal history of nicotine dependence: Secondary | ICD-10-CM

## 2016-01-15 DIAGNOSIS — F3160 Bipolar disorder, current episode mixed, unspecified: Secondary | ICD-10-CM | POA: Insufficient documentation

## 2016-01-15 DIAGNOSIS — F4010 Social phobia, unspecified: Secondary | ICD-10-CM | POA: Diagnosis present

## 2016-01-15 DIAGNOSIS — Z3A13 13 weeks gestation of pregnancy: Secondary | ICD-10-CM | POA: Insufficient documentation

## 2016-01-15 DIAGNOSIS — F17210 Nicotine dependence, cigarettes, uncomplicated: Secondary | ICD-10-CM | POA: Insufficient documentation

## 2016-01-15 DIAGNOSIS — O99342 Other mental disorders complicating pregnancy, second trimester: Principal | ICD-10-CM | POA: Diagnosis present

## 2016-01-15 DIAGNOSIS — O99341 Other mental disorders complicating pregnancy, first trimester: Secondary | ICD-10-CM | POA: Insufficient documentation

## 2016-01-15 DIAGNOSIS — F22 Delusional disorders: Secondary | ICD-10-CM | POA: Diagnosis present

## 2016-01-15 DIAGNOSIS — R45851 Suicidal ideations: Secondary | ICD-10-CM | POA: Diagnosis present

## 2016-01-15 DIAGNOSIS — Z5181 Encounter for therapeutic drug level monitoring: Secondary | ICD-10-CM | POA: Diagnosis not present

## 2016-01-15 DIAGNOSIS — O99331 Smoking (tobacco) complicating pregnancy, first trimester: Secondary | ICD-10-CM | POA: Insufficient documentation

## 2016-01-15 DIAGNOSIS — Z560 Unemployment, unspecified: Secondary | ICD-10-CM

## 2016-01-15 DIAGNOSIS — Z3490 Encounter for supervision of normal pregnancy, unspecified, unspecified trimester: Secondary | ICD-10-CM

## 2016-01-15 DIAGNOSIS — Z9889 Other specified postprocedural states: Secondary | ICD-10-CM | POA: Diagnosis not present

## 2016-01-15 DIAGNOSIS — F3164 Bipolar disorder, current episode mixed, severe, with psychotic features: Secondary | ICD-10-CM | POA: Diagnosis not present

## 2016-01-15 DIAGNOSIS — F329 Major depressive disorder, single episode, unspecified: Secondary | ICD-10-CM

## 2016-01-15 DIAGNOSIS — F32A Depression, unspecified: Secondary | ICD-10-CM

## 2016-01-15 DIAGNOSIS — O99322 Drug use complicating pregnancy, second trimester: Secondary | ICD-10-CM | POA: Diagnosis not present

## 2016-01-15 HISTORY — DX: Anxiety disorder, unspecified: F41.9

## 2016-01-15 LAB — ETHANOL: Alcohol, Ethyl (B): <5 mg/dL (ref <5)

## 2016-01-15 LAB — ACETAMINOPHEN LEVEL

## 2016-01-15 LAB — COMPREHENSIVE METABOLIC PANEL
ALBUMIN: 3.7 g/dL (ref 3.5–5.0)
ALK PHOS: 51 U/L (ref 38–126)
ALT: 12 U/L — AB (ref 14–54)
AST: 13 U/L — AB (ref 15–41)
Anion gap: 5 (ref 5–15)
BUN: 9 mg/dL (ref 6–20)
CALCIUM: 9.2 mg/dL (ref 8.9–10.3)
CO2: 24 mmol/L (ref 22–32)
Chloride: 108 mmol/L (ref 101–111)
Creatinine, Ser: 0.65 mg/dL (ref 0.44–1.00)
GFR calc Af Amer: >60 mL/min (ref >60)
GFR calc non Af Amer: >60 mL/min (ref >60)
GLUCOSE: 85 mg/dL (ref 65–99)
Potassium: 4 mmol/L (ref 3.5–5.1)
SODIUM: 137 mmol/L (ref 135–145)
TOTAL PROTEIN: 6.8 g/dL (ref 6.5–8.1)

## 2016-01-15 LAB — URINE DRUG SCREEN, QUALITATIVE (ARMC ONLY)
Amphetamines, Ur Screen: NOT DETECTED
BARBITURATES, UR SCREEN: NOT DETECTED
Benzodiazepine, Ur Scrn: NOT DETECTED
COCAINE METABOLITE, UR ~~LOC~~: POSITIVE — AB
Cannabinoid 50 Ng, Ur ~~LOC~~: NOT DETECTED
MDMA (Ecstasy)Ur Screen: NOT DETECTED
METHADONE SCREEN, URINE: NOT DETECTED
OPIATE, UR SCREEN: NOT DETECTED
PHENCYCLIDINE (PCP) UR S: NOT DETECTED
Tricyclic, Ur Screen: NOT DETECTED

## 2016-01-15 LAB — POCT PREGNANCY, URINE: Preg Test, Ur: POSITIVE — AB

## 2016-01-15 LAB — SALICYLATE LEVEL: Salicylate Lvl: <4 mg/dL (ref 2.8–30.0)

## 2016-01-15 LAB — CBC
HEMATOCRIT: 31.6 % — AB (ref 35.0–47.0)
HEMOGLOBIN: 10.6 g/dL — AB (ref 12.0–16.0)
MCH: 28.1 pg (ref 26.0–34.0)
MCHC: 33.5 g/dL (ref 32.0–36.0)
MCV: 84 fL (ref 80.0–100.0)
Platelets: 179 10*3/uL (ref 150–440)
RBC: 3.77 MIL/uL — ABNORMAL LOW (ref 3.80–5.20)
RDW: 13.4 % (ref 11.5–14.5)
WBC: 9.9 10*3/uL (ref 3.6–11.0)

## 2016-01-15 MED ORDER — PROSIGHT PO TABS
1.0000 | ORAL_TABLET | Freq: Every day | ORAL | Status: DC
Start: 2016-01-15 — End: 2016-01-15

## 2016-01-15 MED ORDER — OLANZAPINE 10 MG PO TABS: 10.0000 mg | ORAL_TABLET | Freq: Once | ORAL | Status: DC

## 2016-01-15 MED ORDER — LORAZEPAM 1 MG PO TABS
ORAL_TABLET | ORAL | Status: AC
  Filled 2016-01-15: qty 1

## 2016-01-15 MED ORDER — SERTRALINE HCL 25 MG PO TABS: 50.0000 mg | ORAL_TABLET | Freq: Every day | ORAL | Status: DC

## 2016-01-15 MED ORDER — ALUM & MAG HYDROXIDE-SIMETH 200-200-20 MG/5ML PO SUSP: 30.0000 mL | ORAL | Status: DC | PRN

## 2016-01-15 MED ORDER — OCUVITE-LUTEIN PO CAPS
1.0000 | ORAL_CAPSULE | Freq: Every day | ORAL | Status: DC
  Filled 2016-01-15: qty 1

## 2016-01-15 MED ORDER — HYDROXYZINE HCL 25 MG PO TABS
ORAL_TABLET | ORAL | Status: AC
  Filled 2016-01-15: qty 1

## 2016-01-15 MED ORDER — MAGNESIUM HYDROXIDE 400 MG/5ML PO SUSP: 30.0000 mL | Freq: Every day | ORAL | Status: DC | PRN

## 2016-01-15 MED ORDER — OLANZAPINE 5 MG PO TABS
5.0000 mg | ORAL_TABLET | Freq: Two times a day (BID) | ORAL | Status: DC
Start: 2016-01-15 — End: 2016-01-15
  Filled 2016-01-15: qty 1

## 2016-01-15 MED ORDER — OLANZAPINE 5 MG PO TABS: 5.0000 mg | ORAL_TABLET | Freq: Two times a day (BID) | ORAL | Status: DC

## 2016-01-15 MED ORDER — SERTRALINE HCL 50 MG PO TABS
50.0000 mg | ORAL_TABLET | Freq: Every day | ORAL | Status: DC
  Filled 2016-01-15: qty 1

## 2016-01-15 MED ORDER — ADULT MULTIVITAMIN W/MINERALS CH
ORAL_TABLET | ORAL | Status: AC
  Filled 2016-01-15: qty 1

## 2016-01-15 MED ORDER — HYDROXYZINE HCL 25 MG PO TABS
25.0000 mg | ORAL_TABLET | Freq: Once | ORAL | Status: AC
  Administered 2016-01-15: 25 mg via ORAL

## 2016-01-15 MED ORDER — ACETAMINOPHEN 325 MG PO TABS
650.0000 mg | ORAL_TABLET | Freq: Four times a day (QID) | ORAL | Status: DC | PRN
  Administered 2016-01-17: 650 mg via ORAL
  Filled 2016-01-15: qty 2

## 2016-01-15 NOTE — Consult Note (Signed)
[facility] Consult   Reason for Consult:  This is a consult for 30 year old woman with a history of mood disorder comes into the emergency room saying she is suicidal Referring Physician:  Cinda Quest Patient Identification: [name] Young MRN:  [id number] Principal Diagnosis: Bipolar disorder, mixed ([place]) Diagnosis:   Patient Active Problem List   Diagnosis Date Noted  . Bipolar disorder, mixed ([place]) [F31.60] 01/15/2016  . Suicidal ideation [R45.851] 01/15/2016  . Supervision of high risk pregnancy due to social problems, antepartum [O09.70] 10/04/2014  . High-risk pregnancy in third trimester [O09.93] 10/04/2014  . Labor and delivery, indication for care [O75.9] 10/04/2014  . Indication for care in labor or delivery [O75.9] 10/03/2014  . Indication for care in labor and delivery, antepartum [O75.9] 09/14/2014  . Pregnant [Z33.1] 09/14/2014  . Pregnant and not yet delivered [Z34.90] 09/13/2014    Total Time spent with patient: 1 hour  Subjective:   [name] Young is a 30 y.o. female patient admitted with "I've got mood swings and I'm suicidal".  HPI:  Patient interviewed. Chart reviewed including past psychiatric notes. Case reviewed with emergency room doctor and TTS. Labs reviewed. 30 year old woman with a history of mood disorder. She says she's been having mood swings for months but they have been getting much worse recently. She feels like her nerves are torn up all the time. She is sleeping poorly at night. She is not eating well. She feels paranoid constantly. Feels like people are in her house. At times almost has visual hallucinations. She's been having suicidal thoughts with thoughts to overdose or to shoot herself although she admits she has no access to that. Patient just left her job a few days ago because she felt so paranoid. She has a cocaine positive drug screen but denies to me that she has been using cocaine regularly. She says she used it one time  last Friday and that was the only time in memory. Denies other alcohol or drug use. Not currently on any psychiatric medicine. She says her husband is gotten fed up with her mood swings and insisted she come to the hospital. The patient is pregnant. She has known that for about 2 months.  Social history: Lives with her husband. She already has 4 children. Sounds like most of them are living at home. She was working until a week or so ago when she quit her job because of her own paranoia  Medical history: Multiple pregnancies in the past but no other known ongoing medical problems.  Substance abuse history: She denies any alcohol or drug use. When confronted with her cocaine positive drug screen she says that was a one time thing last week. Past records do not indicate that drug or alcohol use has been a problem in the past. Past Psychiatric History: Patient has a history of mood swings and psychiatric hospitalizations. She has been treated with antipsychotics and antidepressants in the past. She has tried to kill her self at least 2 times in the past 1 time by hanging. When depressed and agitated she has had auditory hallucinations and paranoia. Unclear if diagnosis is more psychotic depression or bipolar disorder although currently she is reporting a lot of mood swings and agitation. Previous medicines use during pregnancy include Zyprexa and Zoloft.  Risk to Self: Suicidal Ideation: Yes-Currently Present Suicidal Intent: Yes-Currently Present Is patient at risk for suicide?: Yes Suicidal Plan?: Yes-Currently Present Specify Current Suicidal Plan: Shooting self Access to Means: Yes Specify Access to Suicidal  Means: can get a gun What has been your use of drugs/alcohol within the last 12 months?: Ccoaine, one time use How many times?: 1 Other Self Harm Risks: Reports of none Triggers for Past Attempts: Spouse contact, Family contact, Unpredictable Intentional Self Injurious Behavior:  Cutting Comment - Self Injurious Behavior: History of cutting Risk to Others: Homicidal Ideation: No Thoughts of Harm to Others: No Current Homicidal Intent: No Current Homicidal Plan: No Access to Homicidal Means: No Describe Access to Homicidal Means: Reports of none Identified Victim: Reports of none History of harm to others?: No Assessment of Violence: None Noted Violent Behavior Description: Reports of none Does patient have access to weapons?: Yes (Comment) Criminal Charges Pending?: No Does patient have a court date: No Prior Inpatient Therapy: Prior Inpatient Therapy: Yes Prior Therapy Dates: 07/2011, 03/2011 & 04/2010 Prior Therapy Facilty/Provider(s): [place] Reason for Treatment: Mood Disorder Prior Outpatient Therapy: Prior Outpatient Therapy: Yes Prior Therapy Dates: 2012 Prior Therapy Facilty/Provider(s): [facility] Reason for Treatment: Bipolar Does patient have an ACCT team?: No Does patient have Intensive In-House Services?  : No Does patient have [facility] services? : No Does patient have P4CC services?: No  Past Medical History:  Past Medical History:  Diagnosis Date  . Abnormal Pap smear of cervix   . Anemia   . Auditory hallucination 2012  . [place] in adults   . 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 UTI (urinary tract infection)     Past Surgical History:  Procedure Laterality Date  . ABDOMINAL SURGERY    . SALPINGECTOMY  2007   partial   Family History: History reviewed. No pertinent family history. Family Psychiatric  History: Positive mental health history. States that both of her parents had severe mental health problems. Social History:  History  Alcohol Use No    Comment: occassional- in the past      History  Drug Use No    Comment: past history of use- last + UDS in 2015     Social History   Social History  . Marital status: Married    Spouse name: N/A  . Number of children: N/A  . Years of education: N/A    Social History Main Topics  . Smoking status: Current Every Day Smoker    Packs/day: 0.25    Years: 4.00    Types: Cigarettes  . Smokeless tobacco: Never Used  . Alcohol use No     Comment: occassional- in the past   . Drug use: No     Comment: past history of use- last + UDS in 2015   . Sexual activity: Yes    Birth control/ protection: None   Other Topics Concern  . None   Social History Narrative  . None   Additional Social History:    Allergies:  No Known Allergies  Labs:  Results for orders placed or performed during the hospital encounter of 01/15/16 (from the past 48 hour(s))  Comprehensive metabolic panel     Status: Abnormal   Collection Time: 01/15/16  3:07 PM  Result Value Ref Range   Sodium 137 135 - 145 mmol/L   Potassium 4.0 3.5 - 5.1 mmol/L   Chloride 108 101 - 111 mmol/L   CO2 24 22 - 32 mmol/L   Glucose, Bld 85 65 - 99 mg/dL   BUN 9 6 - 20 mg/dL   Creatinine, Ser 0.65 0.44 - 1.00 mg/dL   Calcium 9.2 8.9 - 10.3 mg/dL  Total Protein 6.8 6.5 - 8.1 g/dL   Albumin 3.7 3.5 - 5.0 g/dL   AST 13 (L) 15 - 41 U/L   ALT 12 (L) 14 - 54 U/L   Alkaline Phosphatase 51 38 - 126 U/L   Total Bilirubin <0.1 (L) 0.3 - 1.2 mg/dL   GFR calc non Af Amer >60 >60 mL/min   GFR calc Af Amer >60 >60 mL/min    Comment: (NOTE) The eGFR has been calculated using the CKD EPI equation. This calculation has not been validated in all clinical situations. eGFR's persistently <60 mL/min signify possible Chronic Kidney Disease.    Anion gap 5 5 - 15  Ethanol     Status: None   Collection Time: 01/15/16  3:07 PM  Result Value Ref Range   Alcohol, Ethyl (B) <5 <5 mg/dL    Comment:        LOWEST DETECTABLE LIMIT FOR SERUM ALCOHOL IS 5 mg/dL FOR MEDICAL PURPOSES ONLY   Salicylate level     Status: None   Collection Time: 01/15/16  3:07 PM  Result Value Ref Range   Salicylate Lvl <5.0 2.8 - 30.0 mg/dL  Acetaminophen level     Status: Abnormal   Collection Time: 01/15/16   3:07 PM  Result Value Ref Range   Acetaminophen (Tylenol), Serum <10 (L) 10 - 30 ug/mL    Comment:        THERAPEUTIC CONCENTRATIONS VARY SIGNIFICANTLY. A RANGE OF 10-30 ug/mL MAY BE AN EFFECTIVE CONCENTRATION FOR MANY PATIENTS. HOWEVER, SOME ARE BEST TREATED AT CONCENTRATIONS OUTSIDE THIS RANGE. ACETAMINOPHEN CONCENTRATIONS >150 ug/mL AT 4 HOURS AFTER INGESTION AND >50 ug/mL AT 12 HOURS AFTER INGESTION ARE OFTEN ASSOCIATED WITH TOXIC REACTIONS.   cbc     Status: Abnormal   Collection Time: 01/15/16  3:07 PM  Result Value Ref Range   WBC 9.9 3.6 - 11.0 K/uL   RBC 3.77 (L) 3.80 - 5.20 MIL/uL   Hemoglobin 10.6 (L) 12.0 - 16.0 g/dL   HCT 31.6 (L) 35.0 - 47.0 %   MCV 84.0 80.0 - 100.0 fL   MCH 28.1 26.0 - 34.0 pg   MCHC 33.5 32.0 - 36.0 g/dL   RDW 13.4 11.5 - 14.5 %   Platelets 179 150 - 440 K/uL  Urine Drug Screen, Qualitative     Status: Abnormal   Collection Time: 01/15/16  3:07 PM  Result Value Ref Range   Tricyclic, Ur Screen NONE DETECTED NONE DETECTED   Amphetamines, Ur Screen NONE DETECTED NONE DETECTED   MDMA (Ecstasy)Ur Screen NONE DETECTED NONE DETECTED   Cocaine Metabolite,Ur [place] POSITIVE (A) NONE DETECTED   Opiate, Ur Screen NONE DETECTED NONE DETECTED   Phencyclidine (PCP) Ur S NONE DETECTED NONE DETECTED   Cannabinoid 50 Ng, Ur [place] NONE DETECTED NONE DETECTED   Barbiturates, Ur Screen NONE DETECTED NONE DETECTED   Benzodiazepine, Ur Scrn NONE DETECTED NONE DETECTED   Methadone Scn, Ur NONE DETECTED NONE DETECTED    Comment: (NOTE) 093  Tricyclics, urine               Cutoff 1000 ng/mL 200  Amphetamines, urine             Cutoff 1000 ng/mL 300  MDMA (Ecstasy), urine           Cutoff 500 ng/mL 400  Cocaine Metabolite, urine       Cutoff 300 ng/mL 500  Opiate, urine  Cutoff 300 ng/mL 600  Phencyclidine (PCP), urine      Cutoff 25 ng/mL 700  Cannabinoid, urine              Cutoff 50 ng/mL 800  Barbiturates, urine             Cutoff 200  ng/mL 900  Benzodiazepine, urine           Cutoff 200 ng/mL 1000 Methadone, urine                Cutoff 300 ng/mL 1100 1200 The urine drug screen provides only a preliminary, unconfirmed 1300 analytical test result and should not be used for non-medical 1400 purposes. Clinical consideration and professional judgment should 1500 be applied to any positive drug screen result due to possible 1600 interfering substances. A more specific alternate chemical method 1700 must be used in order to obtain a confirmed analytical result.  1800 Gas chromato graphy / mass spectrometry (GC/MS) is the preferred 1900 confirmatory method.   Pregnancy, urine POC     Status: Abnormal   Collection Time: 01/15/16  3:18 PM  Result Value Ref Range   Preg Test, Ur POSITIVE (A) NEGATIVE    Comment:        THE SENSITIVITY OF THIS METHODOLOGY IS >24 mIU/mL     Current Facility-Administered Medications  Medication Dose Route Frequency Provider Last Rate Last Dose  . multivitamin (PROSIGHT) tablet 1 tablet  1 tablet Oral Daily John T Clapacs, MD      . OLANZapine (ZYPREXA) tablet 5 mg  5 mg Oral BID PC John T Clapacs, MD      . sertraline (ZOLOFT) tablet 50 mg  50 mg Oral Daily Gonzella Lex, MD       No current outpatient prescriptions on file.    Musculoskeletal: Strength & Muscle Tone: within normal limits Gait & Station: normal Patient leans: N/A  Psychiatric Specialty Exam: Physical Exam  Nursing note and vitals reviewed. Constitutional: She appears well-developed and well-nourished.  HENT:  Head: Normocephalic and atraumatic.  Eyes: Conjunctivae are normal. Pupils are equal, round, and reactive to light.  Neck: Normal range of motion.  Cardiovascular: Regular rhythm and normal heart sounds.   Respiratory: Effort normal. No respiratory distress.  GI: Soft.  Musculoskeletal: Normal range of motion.  Neurological: She is alert.  Skin: Skin is warm and dry.  Psychiatric: Her speech is normal.  Her mood appears anxious. Her affect is labile. She is withdrawn and actively hallucinating. Thought content is paranoid. She expresses impulsivity. She exhibits a depressed mood. She expresses suicidal ideation. She exhibits abnormal recent memory.    Review of Systems  Constitutional: Negative.   HENT: Negative.   Eyes: Negative.   Respiratory: Negative.   Cardiovascular: Negative.   Gastrointestinal: Negative.   Musculoskeletal: Negative.   Skin: Negative.   Neurological: Negative.   Psychiatric/Behavioral: Positive for depression, hallucinations and suicidal ideas. Negative for memory loss and substance abuse. The patient is nervous/anxious and has insomnia.     Blood pressure (!) 113/57, pulse 84, temperature 98.1 F (36.7 C), temperature source Oral, resp. rate 18, height 5' (1.524 m), weight 68 kg (150 lb), SpO2 100 %, unknown if currently breastfeeding.Body mass index is 29.29 kg/m.  General Appearance: Casual  Eye Contact:  Fair  Speech:  Clear and Coherent  Volume:  Decreased  Mood:  Depressed and Irritable  Affect:  Constricted  Thought Process:  Goal Directed  Orientation:  Full (Time, Place, and Person)  Thought Content:  Hallucinations: Visual and Paranoid Ideation  Suicidal Thoughts:  Yes.  with intent/plan  Homicidal Thoughts:  No  Memory:  Immediate;   Fair Recent;   Fair Remote;   Fair  Judgement:  Fair  Insight:  Fair  Psychomotor Activity:  Decreased  Concentration:  Concentration: Fair  Recall:  [employer] of Knowledge:  Fair  Language:  Fair  Akathisia:  No  Handed:  Right  AIMS (if indicated):     Assets:  Desire for Improvement Housing Physical Health Resilience Social Support  ADL's:  Intact  Cognition:  Impaired,  Mild  Sleep:        Treatment Plan Summary: Daily contact with patient to assess and evaluate symptoms and progress in treatment, Medication management and Plan 30 year old woman presents with symptoms of depression and anger  and irritability with labile mood. Starting to have more paranoia and psychotic symptoms. Having intrusive suicidal thoughts. Requires inpatient admission for safety and treatment. She is voluntarily willing to come into the hospital. She is also pregnant. I have ordered an OB/GYN consult. Judging from the previous emergency room documentation she is probably somewhere around [redacted] weeks pregnant. Continue multivitamin. Restart Zyprexa and Zoloft which were used safely in previous pregnancies. Full complement of labs.  Disposition: Recommend psychiatric Inpatient admission when medically cleared. Supportive therapy provided about ongoing stressors.  Alethia Berthold, MD 01/15/2016 4:43 PM

## 2016-01-15 NOTE — ED Notes (Signed)
Pt. Alert and oriented, warm and dry, in no distress. Pt. Denies SI, HI, and AVH. Pt is very labile and requesting medication for her anxiety. Pt made aware of transfer to [facility], pt is in agreement with transfer. Report called to Mozamb[place]erina RNPt. Encouraged to let nursing staff know of any concerns or needs.

## 2016-01-15 NOTE — ED Notes (Signed)
Pt  Vol  Pending  Going  To  [facility]  Med  unit

## 2016-01-15 NOTE — ED Notes (Signed)
Pt refused medication. PT reports "those medications do not work for me." Patient has taken medication in the past and at this time reports she will not taken them again.

## 2016-01-15 NOTE — Progress Notes (Signed)
Unable to perform EKG. Pt with admission nurse.

## 2016-01-15 NOTE — Tx Team (Signed)
Initial Treatment Plan 01/15/2016 9:26 PM [name] RoofBrittney S Babich UJW:[id number]    PATIENT STRESSORS: Marital or family conflict Substance abuse   PATIENT STRENGTHS: Average or above average intelligence Supportive family/friends   PATIENT IDENTIFIED PROBLEMS: Suicidal ideation  Substance abuse                   DISCHARGE CRITERIA:  Improved stabilization in mood, thinking, and/or behavior  PRELIMINARY DISCHARGE PLAN: Return to previous living arrangement  PATIENT/FAMILY INVOLVEMENT: This treatment plan has been presented to and reviewed with the patient, [name] Young, and/or family member.  The patient and family have been given the opportunity to ask questions and make suggestions.  Foster Simpsonrina Dayzee Trower, RN 01/15/2016, 9:26 PM
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
## 2016-01-15 NOTE — BH Assessment (Signed)
[facility]. Toni Amendlapacs.  Attending Physician will be Dr. Jennet MaduroPucilowska.   Patient has been [facility] to room 306, [facility] Charge Nurse [place] F.   Intake Paper Work has been signed and placed on patient chart.  ER staff is aware of the admission Misty Stanley(Lisa, ER Sect.; Dr. Darnelle CatalanMalinda, ER MD; Kasandra KnudsenKarena, Patient's Nurse & Angelique Blonderenise, Patient Access).

## 2016-01-15 NOTE — Progress Notes (Signed)
Spoke with Trina,RN about EKG. Now is not a good time. Pt is asleep. Ask if EKG could be done in AM. EKG will be attempted in the AM

## 2016-01-15 NOTE — ED Notes (Signed)

## 2016-01-15 NOTE — ED Provider Notes (Signed)
[facility] Emergency Department Provider Note   ____________________________________________   First MD Initiated Contact with Patient 01/15/16 1551     (approximate)  I have reviewed the triage vital signs and the nursing notes.   HISTORY  Chief Complaint Suicidal    HPI [name] Young is a 30 y.o. female who reports she's pregnant with her fourth child. The other 3 were full-term healthy children. She says she is about [redacted] weeks pregnant. 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 She stays up late at night thinking about things. And is thinking of hurting herself. Dr. Mat Carne packs a psychiatrist has already seen her and is recommending hospitalization she is okay with that. She has had mood swings before and seen psychiatrist many years ago but nothing ever as bad as this. She denies any other medical problems.   Past Medical History:  Diagnosis Date  . Abnormal Pap smear of cervix   . Anemia   . Auditory hallucination 2012  . Pica in adults   . Severe major depression with psychotic features (HCC) 2012  . UTI (urinary tract infection)     Patient Active Problem List   Diagnosis Date Noted  . Bipolar disorder, mixed (HCC) 01/15/2016  . Suicidal ideation 01/15/2016  . Supervision of high risk pregnancy due to social problems, antepartum 10/04/2014  . High-risk pregnancy in third trimester 10/04/2014  . Labor and delivery, indication for care 10/04/2014  . Indication for care in labor or delivery 10/03/2014  . Indication for care in labor and delivery, antepartum 09/14/2014  . Pregnant 09/14/2014  . Pregnant and not yet delivered 09/13/2014    Past Surgical History:  Procedure Laterality Date  . ABDOMINAL SURGERY    . SALPINGECTOMY  2007   partial    Prior to Admission medications   Not on File    Allergies Review of patient's allergies indicates no known  allergies.  History reviewed. No pertinent family history.  Social History Social History  Substance Use Topics  . Smoking status: Current Every Day Smoker    Packs/day: 0.25    Years: 4.00    Types: Cigarettes  . Smokeless tobacco: Never Used  . Alcohol use No     Comment: occassional- in the past     Review of Systems Constitutional: No fever/chills Eyes: No visual changes. ENT: No sore throat. Cardiovascular: Denies chest pain. Respiratory: Denies shortness of breath. Gastrointestinal: No abdominal pain.  No nausea, no vomiting.  No diarrhea.  No constipation. Genitourinary: Negative for dysuria. Musculoskeletal: Negative for back pain. Skin: Negative for rash. Neurological: Negative for headaches, focal weakness  10-point ROS otherwise negative.  ____________________________________________   PHYSICAL EXAM:  VITAL SIGNS: ED Triage Vitals [01/15/16 1501]  Enc Vitals Group     BP (!) 113/57     Pulse Rate 84     Resp 18     Temp 98.1 F (36.7 C)     Temp Source Oral     SpO2 100 %     Weight 150 lb (68 kg)     Height 5' (1.524 m)     Head Circumference      Peak Flow      Pain Score 0     Pain Loc      Pain Edu?      Excl. in GC?     Constitutional: Alert and oriented. Well appearing and in no  acute distress. Eyes: Conjunctivae are normal. PERRL. EOMI. Head: Atraumatic. Nose: No congestion/rhinnorhea. Mouth/Throat: Mucous membranes are moist.  Oropharynx non-erythematous. Neck: No stridor. Cardiovascular: Normal rate, regular rhythm. Grossly normal heart sounds.  Good peripheral circulation. Respiratory: Normal respiratory effort.  No retractions. Lungs CTAB. Gastrointestinal: Soft and nontender. No distention. No abdominal bruits. No CVA tenderness. Musculoskeletal: No lower extremity tenderness nor edema.  No joint effusions. Neurologic:  Normal speech and language. No gross focal neurologic deficits are appreciated.    ____________________________________________   LABS (all labs ordered are listed, but only abnormal results are displayed)  Labs Reviewed  COMPREHENSIVE METABOLIC PANEL - Abnormal; Notable for the following:       Result Value   AST 13 (*)    ALT 12 (*)    Total Bilirubin <0.1 (*)    All other components within normal limits  ACETAMINOPHEN LEVEL - Abnormal; Notable for the following:    Acetaminophen (Tylenol), Serum <10 (*)    All other components within normal limits  CBC - Abnormal; Notable for the following:    RBC 3.77 (*)    Hemoglobin 10.6 (*)    HCT 31.6 (*)    All other components within normal limits  URINE DRUG SCREEN, QUALITATIVE ([facility] ONLY) - Abnormal; Notable for the following:    Cocaine Metabolite,Ur [place] POSITIVE (*)    All other components within normal limits  POCT PREGNANCY, URINE - Abnormal; Notable for the following:    Preg Test, Ur POSITIVE (*)    All other components within normal limits  ETHANOL  SALICYLATE LEVEL  POC URINE PREG, ED   ____________________________________________  EKG   ____________________________________________  RADIOLOGY   ____________________________________________   PROCEDURES  Procedure(s) performed:   Procedures  Critical Care performed:   ____________________________________________   INITIAL IMPRESSION / ASSESSMENT AND PLAN / ED COURSE  Pertinent labs & imaging results that were available during my care of the patient were reviewed by me and considered in my medical decision making (see chart for details).    Clinical Course     ____________________________________________   FINAL CLINICAL IMPRESSION(S) / ED DIAGNOSES  Final diagnoses:  Depression      NEW MEDICATIONS STARTED DURING THIS VISIT:  New Prescriptions   No medications on file     Note:  This document was prepared using Dragon voice recognition software and may include unintentional dictation errors.    Arnaldo NatalPaul F  Audra Kagel, MD 01/15/16 1710

## 2016-01-15 NOTE — ED Notes (Signed)
Patient was brought to the emergency room after family was concerned about the patient's mood swings, lack of sleep, and irritability. Patient right now denies suicidal ideation and plan, and contracts for safety while in the hospital. She denies HI and AVH and pain. Patient states that she did try cocaine for the first time last week but that she doesn't plan to try it again. Patient is calm and cooperative at this time, willing to go to the inpatient unit. Patient does note that she does not want to take zyprexa or zoloft because they have not helped with her mood swings previously. Will continue to monitor patient. Maintained on 15 minute checks and observation by security camera for safety.

## 2016-01-15 NOTE — ED Notes (Signed)
Patient continues to ask for a dinner tray. Nurse called dining services who said they would bring a tray momentarily. Nurse offered patient graham crackers, peanut butter, and drink and patient refused. Will continue to monitor. 15 minute checks in place.

## 2016-01-15 NOTE — ED Triage Notes (Addendum)
Pt to ed with c/o suicidal thoughts, and audible and visual hallucinations.  Pt states she has been focused on dieing and death. Pt states "I am not depressed"  However admits to thoughts of suicide.

## 2016-01-15 NOTE — ED Notes (Signed)
ENVIRONMENTAL ASSESSMENT Potentially harmful objects out of patient reach: Yes Personal belongings secured: Yes Patient dressed in hospital provided attire only: Yes Plastic bags out of patient reach: Yes Patient care equipment (cords, cables, call bells, lines, and drains) shortened, removed, or accounted for: Yes Equipment and supplies removed from bottom of stretcher: Yes Potentially toxic materials out of patient reach: Yes Sharps container removed or out of patient reach: Yes  Patient currently in room resting. No signs of acute distress noted. Maintained on 15 minute checks and observation by security camera for safety.  

## 2016-01-16 ENCOUNTER — Encounter: Payer: Self-pay | Admitting: Psychiatry

## 2016-01-16 DIAGNOSIS — F1420 Cocaine dependence, uncomplicated: Secondary | ICD-10-CM | POA: Diagnosis present

## 2016-01-16 DIAGNOSIS — F3164 Bipolar disorder, current episode mixed, severe, with psychotic features: Secondary | ICD-10-CM

## 2016-01-16 LAB — URINALYSIS COMPLETE WITH MICROSCOPIC (ARMC ONLY)
BACTERIA UA: NONE SEEN
Bilirubin Urine: NEGATIVE
Glucose, UA: NEGATIVE mg/dL
HGB URINE DIPSTICK: NEGATIVE
Ketones, ur: NEGATIVE mg/dL
LEUKOCYTES UA: NEGATIVE
Nitrite: NEGATIVE
PROTEIN: NEGATIVE mg/dL
SPECIFIC GRAVITY, URINE: 1.019 (ref 1.005–1.030)
WBC, UA: NONE SEEN WBC/hpf (ref 0–5)
pH: 6 (ref 5.0–8.0)

## 2016-01-16 LAB — LIPID PANEL
CHOL/HDL RATIO: 3.4 ratio
Cholesterol: 162 mg/dL (ref 0–200)
HDL: 47 mg/dL (ref >40)
LDL CALC: 97 mg/dL (ref 0–99)
TRIGLYCERIDES: 88 mg/dL (ref <150)
VLDL: 18 mg/dL (ref 0–40)

## 2016-01-16 LAB — TSH: TSH: 0.844 u[IU]/mL (ref 0.350–4.500)

## 2016-01-16 MED ORDER — PRENATAL PLUS 27-1 MG PO TABS
1.0000 | ORAL_TABLET | Freq: Every day | ORAL | Status: DC
  Administered 2016-01-16 – 2016-01-17 (×2): 1 via ORAL
  Filled 2016-01-16 (×5): qty 1

## 2016-01-16 MED ORDER — NICOTINE 21 MG/24HR TD PT24
21.0000 mg | MEDICATED_PATCH | Freq: Every day | TRANSDERMAL | Status: DC
  Administered 2016-01-17 – 2016-01-18 (×2): 21 mg via TRANSDERMAL
  Filled 2016-01-16 (×2): qty 1

## 2016-01-16 MED ORDER — HYDROXYZINE HCL 25 MG PO TABS
25.0000 mg | ORAL_TABLET | Freq: Four times a day (QID) | ORAL | Status: DC | PRN
  Administered 2016-01-16 (×2): 25 mg via ORAL
  Filled 2016-01-16 (×2): qty 1

## 2016-01-16 MED ORDER — FOLIC ACID 1 MG PO TABS
2.0000 mg | ORAL_TABLET | Freq: Every day | ORAL | Status: DC
  Administered 2016-01-16 – 2016-01-18 (×3): 2 mg via ORAL
  Filled 2016-01-16 (×3): qty 2

## 2016-01-16 MED ORDER — ZOLPIDEM TARTRATE 5 MG PO TABS
5.0000 mg | ORAL_TABLET | Freq: Every evening | ORAL | Status: DC | PRN
  Filled 2016-01-16: qty 1

## 2016-01-16 MED ORDER — OLANZAPINE 10 MG PO TABS
10.0000 mg | ORAL_TABLET | Freq: Once | ORAL | Status: AC
  Administered 2016-01-16: 10 mg via ORAL
  Filled 2016-01-16: qty 1

## 2016-01-16 MED ORDER — OLANZAPINE 10 MG PO TABS
10.0000 mg | ORAL_TABLET | Freq: Two times a day (BID) | ORAL | Status: DC
  Administered 2016-01-16 – 2016-01-17 (×2): 10 mg via ORAL
  Filled 2016-01-16 (×2): qty 1

## 2016-01-16 MED ORDER — MENTHOL 3 MG MT LOZG
1.0000 | LOZENGE | Freq: Four times a day (QID) | OROMUCOSAL | Status: DC | PRN
  Administered 2016-01-16: 3 mg via ORAL
  Filled 2016-01-16: qty 9

## 2016-01-16 MED ORDER — ENSURE ENLIVE PO LIQD
237.0000 mL | Freq: Three times a day (TID) | ORAL | Status: DC
  Administered 2016-01-16 – 2016-01-18 (×4): 237 mL via ORAL

## 2016-01-16 NOTE — [facility] Group Notes (Signed)
[facility] Group Notes:  (Nursing/MHT/Case Management/Adjunct)  Date:  01/16/2016  Time:  9:19 PM  Type of Therapy:  Evening Wrap-up Group  Participation Level:  Did Not Attend  Participation Quality:  N/A  Affect:  N/A  Cognitive:  N/A  Insight:  None  Engagement in Group:  N/A  Modes of Intervention:  Activity  Summary of Progress/Problems:  Tomasita MorrowChelsea Nanta Abdulrahman Bracey 01/16/2016, 9:19 PM

## 2016-01-16 NOTE — Progress Notes (Signed)
D. Patient noted labile this am  with Clinical research associatewriter and MD . Voice of wanting something for her nerves . Patient    Was not  Fully understanding  The harm this could do to her baby.  Curse  Staff  This am  Patient stated slept good last night .Stated appetite is fairand energy level  Is normal. Stated concentration is good . Stated on Depression scale 1, hopeless1 and anxiety 6 .( low 0-10 high) Denies suicidal  homicidal ideations  .  No auditory hallucinations  No pain concerns . Appropriate ADL'S. Interacting with peers and staff. Patient  stated she got an ultra sound this summer  And has been counting down since each Saturday as another week . Patient has not had any prenatal check ups. Patient accepted taking the medication Zyprexa . A: Encourage patient participation with unit programming . Instruction  Given on  Medication , verbalize understanding. R: Voice no other concerns. Staff continue to monitor

## 2016-01-16 NOTE — [facility] Suicide Risk Assessment (Signed)
[facility]   Nursing information obtained from:  Patient Demographic factors:    Current Mental Status:    Loss Factors:    Historical Factors:    Risk Reduction Factors:     Total Time spent with patient: 1 hour Principal Problem: Bipolar disorder, curr episode mixed, severe, with psychotic features (HCC) Diagnosis:   Patient Active Problem List   Diagnosis Date Noted  . Cocaine use disorder, moderate, dependence (HCC) [F14.20] 01/16/2016  . Suicidal ideation [R45.851] 01/15/2016  . Bipolar disorder, curr episode mixed, severe, with psychotic features (HCC) [F31.64] 01/15/2016  . Supervision of high risk pregnancy due to social problems, antepartum [O09.70] 10/04/2014  . Pregnant and not yet delivered [Z34.90] 09/13/2014   Subjective Data: mood instability, psychosis, suicidal ideation.  Continued Clinical Symptoms:  Alcohol Use Disorder Identification Test Final Score (AUDIT): 0 The "Alcohol Use Disorders Identification Test", Guidelines for Use in Primary Care, Second Edition.  World Science writerHealth Organization [facility](WHO). Score between 0-7:  no or low risk or alcohol related problems. Score between 8-15:  moderate risk of alcohol related problems. Score between 16-19:  high risk of alcohol related problems. Score 20 or above:  warrants further diagnostic evaluation for alcohol dependence and treatment.   CLINICAL FACTORS:   Severe Anxiety and/or Agitation Bipolar Disorder:   Mixed State Alcohol/Substance Abuse/Dependencies Currently Psychotic   Musculoskeletal: Strength & Muscle Tone: within normal limits Gait & Station: normal Patient leans: N/A  Psychiatric Specialty Exam: Physical Exam  Nursing note and vitals reviewed.   Review of Systems  Constitutional: Positive for weight loss.  Psychiatric/Behavioral: Positive for hallucinations, substance abuse and suicidal ideas. The patient is nervous/anxious and has insomnia.   All other systems  reviewed and are negative.   Blood pressure 96/65, pulse 73, temperature 98.6 F (37 C), resp. rate 18, height 5' (1.524 m), weight 66.2 kg (146 lb), SpO2 100 %, unknown if currently breastfeeding.Body mass index is 28.51 kg/m.  General Appearance: Disheveled  Eye Contact:  Good  Speech:  Clear and Coherent  Volume:  Increased  Mood:  Angry, Dysphoric and Irritable  Affect:  Congruent  Thought Process:  Goal Directed  Orientation:  Full (Time, Place, and Person)  Thought Content:  Delusions, Hallucinations: Auditory Visual and Paranoid Ideation  Suicidal Thoughts:  Yes.  with intent/plan  Homicidal Thoughts:  No  Memory:  Immediate;   Fair Recent;   Fair Remote;   Fair  Judgement:  Poor  Insight:  Lacking  Psychomotor Activity:  Normal  Concentration:  Concentration: Fair and Attention Span: Fair  Recall:  F[employer]Fair  Fund of Knowledge:  Fair  Language:  Fair  Akathisia:  No  Handed:  Right  AIMS (if indicated):     Assets:  Communication Skills Desire for Improvement Financial Resources/Insurance Housing Intimacy Physical Health Resilience Social Support  ADL's:  Intact  Cognition:  WNL  Sleep:  Number of Hours: 7.45      COGNITIVE FEATURES THAT CONTRIBUTE TO RISK:  None    SUICIDE RISK:   Moderate:  Frequent suicidal ideation with limited intensity, and duration, some specificity in terms of plans, no associated intent, good self-control, limited dysphoria/symptomatology, some risk factors present, and identifiable protective factors, including available and accessible social support.   PLAN OF CARE: Hospital admission, medication management, substance abuse counseling, discharge planning.  [name] Young is a 30 year old pregnant female with a history of depression, mood instability, psychosis, suicide attempts, and cocaine abuse admitted for profound mood swings, psychotic  symptoms and suicidal ideation with a plan to shoot herself.  1. Suicidal ideation. The  patient is able to contract for safety in the hospital.  2. Mood and psychosis. The patient has a long history of mood disorder. She was treated with Zyprexa and Zoloft during her previous pregnancy. Dr. Toni Amend attempted to restart medications but the patient refused. She does not believe that Zyprexa and Zoloft were helpful in the past. She also tried Jordan and found it unhelpful as well.  3. Cocaine abuse. The patient has been using cocaine during this pregnancy as well as previous ones. Her husband reportedly is not a user. The patient is not interested in substance abuse treatment. Social worker called child protective services.  4. Pregnancy. We started folic acid and prenatal vitamin. GYN consults pending. The patient had high-risk pregnancy before and went to [facility].  5. Insomnia. We'll give Ambien as needed.  6. Anxiety. We'll offer low dose Vistaril.  7. Vaginosis or yeast infection. This will be addressed by GYN service.  8. Disposition. She will be discharged to home with family. She will follow up with [facility].  I certify that inpatient services furnished can reasonably be expected to improve the patient's condition.  Kristine Linea, MD 01/16/2016, 8:44 AM

## 2016-01-16 NOTE — Progress Notes (Signed)
Recreation Therapy Notes  Date: 09.13.17 Time: 1:00 pm Location: Craft Room  Group Topic: Self-esteem  Goal Area(s) Addresses:  Patient will be able to identify benefit of self-esteem. Patient will be able to identify ways to increase self-esteem.  Behavioral Response: Attentive, Interactive, Left early  Intervention: Self-Portrait  Activity: Patients were instructed to draw their self-portrait of how they were feeling. Patients were instructed to write their name on a piece of paper and a positive trait. Patient then wrote positive traits about peers. Patient were instructed to draw their self-portrait of how they felt after reading the feedback from their peers.  Education: LRT educated patients on ways they can increase their self-esteem.  Education Outcome: Patient left before LRT educated group.  Clinical Observations/Feedback: Patient completed activity by drawing both self-portraits. Patients wrote positive traits about self and peers. Patient contributed to group discussion by stating what was different about her faces. Patient left group to see about getting medicine for her throat. Patient did not return to group.  Jacquelynn CreeGreene,Jill Ruppe M, LRT/CTRS 01/16/2016 3:23 PM

## 2016-01-16 NOTE — Progress Notes (Signed)
NUTRITION ASSESSMENT  Pt identified as at risk on the Malnutrition Screen Tool  INTERVENTION: 1.Monitor intake and cater to pt preferences 2. Agree with adding ensure for added nutrition, prenatal vitamin and folic acid  NUTRITION DIAGNOSIS: Unintentional weight loss related to sub-optimal intake as evidenced by pt report.   Goal: Pt to meet >/= 90% of their estimated nutrition needs.  Monitor:  PO intake  Assessment:    30 y.o. female admitted with suicidal ideation and cocaine. Pt is [redacted] weeks pregnant.  Height: Ht Readings from Last 1 Encounters:  01/15/16 5' (1.524 m)    Weight: Wt Readings from Last 1 Encounters:  01/15/16 146 lb (66.2 kg)    Weight Hx: reviewed noted similar wt on 9/12 and 11/23/15 Wt Readings from Last 10 Encounters:  01/15/16 146 lb (66.2 kg)  01/15/16 150 lb (68 kg)  11/23/15 145 lb (65.8 kg)  11/21/15 150 lb (68 kg)  11/18/15 145 lb (65.8 kg)  10/31/15 151 lb 11.2 oz (68.8 kg)  09/11/15 151 lb (68.5 kg)  07/31/15 157 lb (71.2 kg)  07/05/15 152 lb (68.9 kg)  07/05/15 153 lb (69.4 kg)    BMI:  Body mass index is 28.51 kg/m.   Estimated Nutritional Needs: Kcal: 1980 kcals/d Protein: > 79 gram protein/kg Fluid: 1980 ml/kcal  Diet Order: Diet regular Room service appropriate? Yes; Fluid consistency: Thin Pt is also offered choice of unit snacks. Pt eating 90-100% of meals Pt is eating as desired.   Lab results and medications reviewed.   Roye Gustafson B. Freida BusmanAllen, RD, LDN [phone] (pager) Weekend/On-Call pager [phone])
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
## 2016-01-16 NOTE — [facility] Group Notes (Signed)
[facility] LCSW Group Therapy   01/16/2016  9:30 am   Type of Therapy: Group Therapy: Emotional Regulation    Participation Level: Invited but did not attend.  Participation Quality: Invited but did not attend.   Trenton FoundsKadijah, MSW, LCSWA 01/16/2016, 11:45AM

## 2016-01-16 NOTE — [facility] Group Notes (Signed)
[facility] Group Notes:  (Nursing/MHT/Case Management/Adjunct)  Date:  01/16/2016  Time:  5:39 PM  Type of Therapy:  Psychoeducational Skills  Participation Level:  Did Not Attend  Elam Ellis Travis Darnell Stimson 01/16/2016, 5:39 PM 

## 2016-01-16 NOTE — Progress Notes (Signed)
Patient is 30 year old BF admitted to the unit voluntarily seeking treatment for mood swings and suicidal ideation. Pt states she has five children at home and [redacted] weeks pregnant. Pt said she'll be 14 weeks on Friday. Pt is irritable and agitated during the admission process. States "I need something for my nerves-that pill they gave me is not doing anything. Vistaril 25 mg po given at 1958 one time dose. Pt is demanding this Clinical research associatewriter to call the doctor. States "I need something else".  Dr Toni Amendlapacs notified. N.O. Zyprexa 10 mg po x1. Pt refused medication. Pt said "that don't do anything for me". Denies AV/H.  No voiced thoughts of hurting herself. Patient is frequently monitored for safety. Skin checked with Heather, [facility]. Skin intact. no contraband found. Pt resting quietly in bed.

## 2016-01-17 ENCOUNTER — Encounter: Payer: Self-pay | Admitting: Obstetrics and Gynecology

## 2016-01-17 DIAGNOSIS — O99322 Drug use complicating pregnancy, second trimester: Secondary | ICD-10-CM

## 2016-01-17 MED ORDER — HYDROXYZINE HCL 25 MG PO TABS
25.0000 mg | ORAL_TABLET | Freq: Three times a day (TID) | ORAL | Status: DC | PRN
Start: 2016-01-17 — End: 2016-01-18
  Administered 2016-01-17 – 2016-01-18 (×3): 25 mg via ORAL
  Filled 2016-01-17 (×3): qty 1

## 2016-01-17 MED ORDER — OLANZAPINE 10 MG PO TABS
10.0000 mg | ORAL_TABLET | Freq: Every day | ORAL | Status: DC
Start: 2016-01-17 — End: 2016-01-18
  Administered 2016-01-17: 10 mg via ORAL
  Filled 2016-01-17: qty 1

## 2016-01-17 MED ORDER — BENZOCAINE 10 % MT GEL
Freq: Four times a day (QID) | OROMUCOSAL | Status: DC | PRN
  Administered 2016-01-17: 23:00:00 via OROMUCOSAL
  Filled 2016-01-17: qty 9.4

## 2016-01-17 NOTE — Plan of Care (Signed)
Problem: Medication: Goal: Compliance with prescribed medication regimen will improve Outcome: Progressing Pt takes nighttime medications as prescribed.  Problem: Safety: Goal: Ability to remain free from injury will improve Outcome: Progressing Pt remains free from harm.
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
## 2016-01-17 NOTE — Progress Notes (Signed)
[facility] (phone number [phone]) that states she was unaware of consult. Reports she will come see the pt before the end of today.  Reported pt c/o abd pain, small amount of clear discharge.  Will continue to monitor.

## 2016-01-17 NOTE — Progress Notes (Signed)
Pt isolative to her room, [redacted] weeks pregnant, very drowsy/lethargic today. Has spent most of the day in bed asleep. Snoring noted while sleeping. Arouses to voice, but difficult to persuade to get up out of bed for medication administration. Appetite very good. Guarded, only interacts with writer if the topic of discussion is food or whether an OB/GYN will be visiting patient. [facility] consulted and is aware per Dr. Ardyth HarpsHernandez. Denies SI/HI/AVH today. Does not attend group. Only exits room for meals.   Safety maintained with 15 minute checks. Support and encouragement provided. Medications administered as ordered. Will continue to monitor.

## 2016-01-17 NOTE — Progress Notes (Signed)
D: Pt is isolative to her room this evening. She appears drowsy and takes several attempts to get up for evening medications. Pt is guarded with interaction. She rates depression 1/0 and anxiety 6/10. Denies SI/HI/AVH at this time. A: Emotional support and encouragement provided. Medications administered with education. q15 minute safety checks maintained. R: Pt remains free from harm. Will continue to monitor.

## 2016-01-17 NOTE — [facility] Group Notes (Signed)
[facility] LCSW Group Therapy   01/17/2016 9:30 am   Type of Therapy: Group Therapy   Participation Level: Invited but did not attend.  Participation Quality: Invited but did not attend.    Hampton AbbotKadijah Karsynn Deweese, MSW, LCSW-A 01/17/2016, 10:46AM
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
## 2016-01-17 NOTE — [facility] Group Notes (Signed)
[facility] Group Notes:  (Nursing/MHT/Case Management/Adjunct)  Date:  01/17/2016  Time:  4:29 PM  Type of Therapy:  Psychoeducational Skills  Participation Level:  Did Not Attend  Lynelle SmokeCara Travis [facility] 01/17/2016, 4:29 PM

## 2016-01-17 NOTE — Plan of Care (Signed)
Problem: Coping: Goal: Ability to demonstrate self-control will improve Outcome: Not Progressing Pt asleep in bed most of the day, uncooperative with treatment goals at this time. Medications lowered by physician as ordered to help with drowsiness. Will continue to monitor.

## 2016-01-17 NOTE — [facility] Counselor (Signed)
  CSW attempted to meet with patient to complete assessment. CSW went to patient's room. Patient was not willing to get out of her bed and was extremely drowsy and lethargic. Patient is too acute to complete assessment at this time.   Dierre Crevier G. Garnette CzechSampson MSW, LCSWA 01/17/2016 10:10 AM

## 2016-01-18 MED ORDER — OLANZAPINE 10 MG PO TABS: 10.0000 mg | ORAL_TABLET | Freq: Every day | ORAL | 0 refills | Status: DC

## 2016-01-18 MED ORDER — HYDROXYZINE HCL 25 MG PO TABS: 25.0000 mg | ORAL_TABLET | Freq: Three times a day (TID) | ORAL | 0 refills | Status: DC | PRN

## 2016-01-18 MED ORDER — PRENATAL PLUS 27-1 MG PO TABS: 1.0000 | ORAL_TABLET | Freq: Every day | ORAL | 0 refills | Status: DC

## 2016-01-18 NOTE — Plan of Care (Signed)
Problem: Pain Managment: Goal: General experience of comfort will improve Outcome: Not Progressing Pt experienced "severe" tooth pain that progressed in the early part of the night.

## 2016-01-18 NOTE — Progress Notes (Signed)
D: Observed pt interacting with peers. Patient alert and oriented x4. Patient denies SI/HI/AVH. Pt affect is anxious. Pt interacting a bit more with peers. Pt had questions about when her next ultrasound would be. Pt questioned multiple times when she could be discharge. Pt denied feeling depressed or anxious.  Pt stated her mood was great. Pt c/o of tooth lower right tooth pain. Pt talked briefly about her husband telling pt that she should "check myself in." Pt endorsed that she no longer needs to be here.  A: Offered active listening and support. Provided therapeutic communication. Administered scheduled medications. Educated pt on plan of care and encouraged pt to talk to her doctor about discharge concerns. Gave tylenol prn for pain. R: Pt pleasant and cooperative. Pt medication compliant. Will continue Q15 min. checks. Safety maintained.

## 2016-01-18 NOTE — Progress Notes (Signed)
Discharge Note:  Patient discharged from unit at 1120 AM.  Patient denies SI/AVH/HI at this time.  Patient appeared to be in no acute distress at this time.  Patient discharge instructions and follow-up appointments were explained and patient verbalized understanding.  Patient belongings returned and patient acknowledged receipt of those belongings.  Patient left facility with husband.
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
## 2016-01-18 NOTE — Progress Notes (Signed)
  [facility] :  Will you be returning to the same living situation after discharge:  Yes,  with husband At discharge, do you have transportation home?: Yes,  husband Do you have the ability to pay for your medications: Yes,  financial support from husband  Release of information consent forms completed and in the chart;  Patient's signature needed at discharge.  Patient to Follow up at: Follow-up Information    [facility] Follow up on 01/21/2016.   Why:  Go to [facility] for follow-up care which will include medication management and outpatient therapy. Walk-in hours for new patients are M,W,F 8a-3p. Please arrive at 7:30a for prompt service. Unk PintoHarvey Bryant Peer Support Specialist [phone] for any questions. Contact information: [address] [place] [address] [phone]           Next level of care provider has access to [facility] Link:no  Safety Planning and Suicide Prevention discussed: Yes,  with husband and patient  Have you used any form of tobacco in the last 30 days? (Cigarettes, Smokeless Tobacco, Cigars, and/or Pipes): Yes  Has patient been referred to the Quitline?: Patient refused referral  Patient has been referred for addiction treatment: Yes  Kisa Fujii G. Garnette CzechSampson MSW, LCSWA 01/18/2016, 10:43 AM

## 2016-01-18 NOTE — BHH Suicide Risk Assessment (Signed)
[facility]   Principal Problem: Bipolar disorder, curr episode mixed, severe, with psychotic features [facility](HCC) Discharge Diagnoses:  Patient Active Problem List   Diagnosis Date Noted  . Cocaine use disorder, moderate, dependence (HCC) [F14.20] 01/16/2016  . Suicidal ideation [R45.851] 01/15/2016  . Bipolar disorder, curr episode mixed, severe, with psychotic features (HCC) [F31.64] 01/15/2016  . Pregnant and not yet delivered [Z34.90] 09/13/2014      Psychiatric Specialty Exam: ROS  Blood pressure 121/72, pulse 85, temperature 98.3 F (36.8 C), temperature source Oral, resp. rate 18, height 5' (1.524 m), weight 66.2 kg (146 lb), last menstrual period 10/13/2015, SpO2 100 %, unknown if currently breastfeeding.Body mass index is 28.51 kg/m.                                                       Mental Status Per Nursing Assessment::   On Admission:     Demographic Factors:  [name], married, employed  Loss Factors: NA  Historical Factors: Impulsivity  Risk Reduction Factors:   Pregnancy, Sense of responsibility to family and Living with another person, especially a relative  Continued Clinical Symptoms:  Alcohol/Substance Abuse/Dependencies Previous Psychiatric Diagnoses and Treatments  Cognitive Features That Contribute To Risk:  None    Suicide Risk:  Minimal: No identifiable suicidal ideation.  Patients presenting with no risk factors but with morbid ruminations; may be classified as minimal risk based on the severity of the depressive symptoms    [name] FootmanHernandez-Gonzalez,  [name] Willenbring, MD 01/18/2016, 9:39 AM
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
## 2016-01-18 NOTE — Progress Notes (Signed)
D: Pt woke up c/o of severe pain in her tooth 10/10. Pt was very upset crying and demanding to go to the emergency room. Pt appears to have a black area on her tooth in the lower right portion of of her mouth. Pt stating "I can't go to sleep.Marland Kitchen.Marland Kitchen.I need something for the pain." No swelling noted on lower right side of face  A: Examined pt's mouth and face. Called doctor in regards to pt's pain. Gave orajel per doctor's order. Supported pt's concerns. R: Pt was thankful. Pt fell back asleep.

## 2016-01-18 NOTE — BHH Suicide Risk Assessment (Signed)
BHH INPATIENT:  Family/Significant Other Suicide Prevention Education  Suicide Prevention Education:  Education Completed; [name] Young (spouse [phone]), has been identified by the patient as the family member/significant other with whom the patient will be residing, and identified as the person(s) who will aid the patient in the event of a mental health crisis (suicidal ideations/suicide attempt).  With written consent from the patient, the family member/significant other has been provided the following suicide prevention education, prior to the and/or following the discharge of the patient.  The suicide prevention education provided includes the following:  Suicide risk factors  Suicide prevention and interventions  National Suicide Hotline telephone number  [facility] assessment telephone number  [facility] Emergency Assistance 911  [facility] and/or Residential Mobile Crisis Unit telephone number  Request made of family/significant other to:  Remove weapons (e.g., guns, rifles, knives), all items previously/currently identified as safety concern.    Remove drugs/medications (over-the-counter, prescriptions, illicit drugs), all items previously/currently identified as a safety concern.  The family member/significant other verbalizes understanding of the suicide prevention education information provided.  The family member/significant other agrees to remove the items of safety concern listed above.  [name] Young MSW, LCSWA 01/18/2016, 10:41 AM

## 2016-01-18 NOTE — Tx Team (Signed)
Interdisciplinary Treatment and Diagnostic Plan Update  01/18/2016 Time of Session: 10:45am [name] Young MRN: [id number]  Principal Diagnosis: Bipolar disorder, curr episode mixed, severe, with psychotic features (HCC)  Secondary Diagnoses: Principal Problem:   Bipolar disorder, curr episode mixed, severe, with psychotic features (HCC) Active Problems:   Pregnant and not yet delivered   Suicidal ideation   Cocaine use disorder, moderate, dependence (HCC)   Current Medications:  Current Facility-Administered Medications  Medication Dose Route Frequency Provider Last Rate Last Dose  . acetaminophen (TYLENOL) tablet 650 mg  650 mg Oral Q6H PRN Audery AmelJohn T Clapacs, MD   650 mg at 01/17/16 2144  . alum & mag hydroxide-simeth (MAALOX/MYLANTA) 200-200-20 MG/5ML suspension 30 mL  30 mL Oral Q4H PRN Audery AmelJohn T Clapacs, MD      . benzocaine (ORAJEL) 10 % mucosal gel   Mouth/Throat Q6H PRN Brandy HaleUzma Faheem, MD      . feeding supplement (ENSURE ENLIVE) (ENSURE ENLIVE) liquid 237 mL  237 mL Oral TID BM Jolanta B Pucilowska, MD   237 mL at 01/18/16 1025  . folic acid (FOLVITE) tablet 2 mg  2 mg Oral Daily Jolanta B Pucilowska, MD   2 mg at 01/18/16 0830  . hydrOXYzine (ATARAX/VISTARIL) tablet 25 mg  25 mg Oral TID PRN Jimmy FootmanAndrea Hernandez-Gonzalez, MD   25 mg at 01/18/16 0834  . magnesium hydroxide (MILK OF MAGNESIA) suspension 30 mL  30 mL Oral Daily PRN Audery AmelJohn T Clapacs, MD      . menthol-cetylpyridinium (CEPACOL) lozenge 3 mg  1 lozenge Oral QID PRN Shari ProwsJolanta B Pucilowska, MD   3 mg at 01/16/16 2222  . nicotine (NICODERM CQ - dosed in mg/24 hours) patch 21 mg  21 mg Transdermal Daily Jolanta B Pucilowska, MD   21 mg at 01/18/16 0835  . OLANZapine (ZYPREXA) tablet 10 mg  10 mg Oral QHS Jimmy FootmanAndrea Hernandez-Gonzalez, MD   10 mg at 01/17/16 2144  . prenatal vitamin w/FE, FA (PRENATAL 1 + 1) 27-1 MG tablet 1 tablet  1 tablet Oral Q1200 Jolanta B Pucilowska, MD   1 tablet at 01/17/16 1014   PTA Medications: No  prescriptions prior to admission.    Treatment Modalities: Medication Management, Group therapy, Case management,  1 to 1 session with clinician, Psychoeducation, Recreational therapy.   Physician Treatment Plan for Primary Diagnosis: Bipolar disorder, curr episode mixed, severe, with psychotic features (HCC) Long Term Goal(s): Improvement in symptoms so as ready for discharge   Short Term Goals: Ability to identify changes in lifestyle to reduce recurrence of condition will improve, Ability to disclose and discuss suicidal ideas, Ability to demonstrate self-control will improve, Ability to maintain clinical measurements within normal limits will improve, Compliance with prescribed medications will improve and Ability to identify triggers associated with substance abuse/mental health issues will improve  Medication Management: Evaluate patient's response, side effects, and tolerance of medication regimen.  Therapeutic Interventions: 1 to 1 sessions, Unit Group sessions and Medication administration.  Evaluation of Outcomes: Adequate for Discharge  Physician Treatment Plan for Secondary Diagnosis: Principal Problem:   Bipolar disorder, curr episode mixed, severe, with psychotic features (HCC) Active Problems:   Pregnant and not yet delivered   Suicidal ideation   Cocaine use disorder, moderate, dependence (HCC)  Long Term Goal(s): Improvement in symptoms so as ready for discharge  Short Term Goals: Ability to identify changes in lifestyle to reduce recurrence of condition will improve, Ability to demonstrate self-control will improve, Ability to maintain clinical measurements within normal limits will  improve, Compliance with prescribed medications will improve and Ability to identify triggers associated with substance abuse/mental health issues will improve  Medication Management: Evaluate patient's response, side effects, and tolerance of medication regimen.  Therapeutic  Interventions: 1 to 1 sessions, Unit Group sessions and Medication administration.  Evaluation of Outcomes: Adequate for Discharge   RN Treatment Plan for Primary Diagnosis: Bipolar disorder, curr episode mixed, severe, with psychotic features (HCC) Long Term Goal(s): Knowledge of disease and therapeutic regimen to maintain health will improve  Short Term Goals: Ability to remain free from injury will improve, Ability to participate in decision making will improve and Compliance with prescribed medications will improve  Medication Management: RN will administer medications as ordered by provider, will assess and evaluate patient's response and provide education to patient for prescribed medication. RN will report any adverse and/or side effects to prescribing provider.  Therapeutic Interventions: 1 on 1 counseling sessions, Psychoeducation, Medication administration, Evaluate responses to treatment, Monitor vital signs and CBGs as ordered, Perform/monitor CIWA, COWS, AIMS and Fall Risk screenings as ordered, Perform wound care treatments as ordered.  Evaluation of Outcomes: Adequate for Discharge   LCSW Treatment Plan for Primary Diagnosis: Bipolar disorder, curr episode mixed, severe, with psychotic features (HCC) Long Term Goal(s): Safe transition to appropriate next level of care at discharge, Engage patient in therapeutic group addressing interpersonal concerns.  Short Term Goals: Engage patient in aftercare planning with referrals and resources, Increase ability to appropriately verbalize feelings, Increase emotional regulation, Facilitate acceptance of mental health diagnosis and concerns, Facilitate patient progression through stages of change regarding substance use diagnoses and concerns, Identify triggers associated with mental health/substance abuse issues and Increase skills for wellness and recovery  Therapeutic Interventions: Assess for all discharge needs, 1 to 1 time with Social  worker, Explore available resources and support systems, Assess for adequacy in community support network, Educate family and significant other(s) on suicide prevention, Complete Psychosocial Assessment, Interpersonal group therapy.  Evaluation of Outcomes: Adequate for Discharge   Progress in Treatment: Attending groups: Yes. Participating in groups: Yes. Taking medication as prescribed: Yes. Toleration medication: Yes. Family/Significant other contact made: Yes, individual(s) contacted:  husband Patient understands diagnosis: Yes. Discussing patient identified problems/goals with staff: Yes. Medical problems stabilized or resolved: Yes. Denies suicidal/homicidal ideation: Yes. Issues/concerns per patient self-inventory: No. Other: n/a  New problem(s) identified: None identified at this time.  New Short Term/Long Term Goal(s): None identified at this time.   Discharge Plan or Barriers: Patient will discharge home to live with her husband and children and has follow-up with [facility] for medication management and outpatient therapy.  Reason for Continuation of Hospitalization: Anticipated discharge 01/18/16  Estimated Length of Stay:Anticipated discharge 01/18/16  Attendees: Patient:[name] Young 01/18/2016 10:49 AM  Physician: Dr. Radene JourneyJayme Cloud, MD 01/18/2016 10:49 AM  Nursing: Volanda Napoleon 01/18/2016 10:49 AM  RN Care Manager: 01/18/2016 10:49 AM  Social Worker: Fredrich Birks. Garnette Czech MSW, LCSWA 01/18/2016 10:49 AM  Recreational Therapist: Jacquelynn Cree, LRT/CTRS 01/18/2016 10:49 AM  Other:  01/18/2016 10:49 AM  Other:  01/18/2016 10:49 AM  Other: 01/18/2016 10:49 AM    Scribe for Treatment Team: Fredrich Birks. Garnette Czech MSW, [facility] 01/18/2016 10:55 AM

## 2016-01-18 NOTE — [facility] Group Notes (Signed)
[facility] Group Notes:  (Nursing/MHT/Case Management/Adjunct)  Date:  01/18/2016  Time:  2:45 AM  Type of Therapy:  Group Therapy  Participation Level:  Active  Participation Quality:  Appropriate  Affect:  Appropriate  Cognitive:  Appropriate  Insight:  Appropriate  Engagement in Group:  Engaged  Modes of Intervention:  n/a  Summary of Progress/Problems:  Veva Holesshley Imani Madeliene Tejera 01/18/2016, 2:45 AM

## 2016-01-24 ENCOUNTER — Emergency Department: Payer: Medicaid Other

## 2016-01-24 ENCOUNTER — Encounter: Payer: Self-pay | Admitting: Emergency Medicine

## 2016-01-24 ENCOUNTER — Emergency Department
Admission: EM | Admit: 2016-01-24 | Discharge: 2016-01-24 | Discharge status: Against Medical Advice | Payer: Medicaid Other | Attending: Student in an Organized Health Care Education/Training Program | Admitting: Student in an Organized Health Care Education/Training Program

## 2016-01-24 DIAGNOSIS — O9A212 Injury, poisoning and certain other consequences of external causes complicating pregnancy, second trimester: Secondary | ICD-10-CM

## 2016-01-24 DIAGNOSIS — R102 Pelvic and perineal pain: Secondary | ICD-10-CM

## 2016-01-24 DIAGNOSIS — O99332 Smoking (tobacco) complicating pregnancy, second trimester: Secondary | ICD-10-CM | POA: Insufficient documentation

## 2016-01-24 DIAGNOSIS — Y9389 Activity, other specified: Secondary | ICD-10-CM | POA: Insufficient documentation

## 2016-01-24 DIAGNOSIS — F17210 Nicotine dependence, cigarettes, uncomplicated: Secondary | ICD-10-CM | POA: Insufficient documentation

## 2016-01-24 DIAGNOSIS — Y999 Unspecified external cause status: Secondary | ICD-10-CM | POA: Diagnosis not present

## 2016-01-24 DIAGNOSIS — T1490XA Injury, unspecified, initial encounter: Secondary | ICD-10-CM

## 2016-01-24 DIAGNOSIS — O26892 Other specified pregnancy related conditions, second trimester: Secondary | ICD-10-CM

## 2016-01-24 DIAGNOSIS — O209 Hemorrhage in early pregnancy, unspecified: Secondary | ICD-10-CM | POA: Insufficient documentation

## 2016-01-24 DIAGNOSIS — Y9289 Other specified places as the place of occurrence of the external cause: Secondary | ICD-10-CM | POA: Insufficient documentation

## 2016-01-24 DIAGNOSIS — Z79899 Other long term (current) drug therapy: Secondary | ICD-10-CM | POA: Diagnosis not present

## 2016-01-24 DIAGNOSIS — Z3A15 15 weeks gestation of pregnancy: Secondary | ICD-10-CM | POA: Insufficient documentation

## 2016-01-24 DIAGNOSIS — O4692 Antepartum hemorrhage, unspecified, second trimester: Secondary | ICD-10-CM

## 2016-01-24 DIAGNOSIS — O4592 Premature separation of placenta, unspecified, second trimester: Secondary | ICD-10-CM

## 2016-01-24 LAB — HCG, QUANTITATIVE, PREGNANCY: hCG, Beta Chain, Quant, S: 60563 m[IU]/mL — ABNORMAL HIGH (ref <5)

## 2016-01-24 LAB — ABO/RH: ABO/RH(D): O POS

## 2016-01-24 NOTE — ED Notes (Signed)
Babysat her son while she went to x-ray.
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
## 2016-01-24 NOTE — ED Notes (Signed)
[employer] office in triage room with the patient to collect statement, per [employer] [place] of [place] situation and per Officer Scarlette Sliceavis, [place] [employer] is to come to hospital to collect patient statement. This RN spoke with SANE nurse regarding patient situation, SANE nurse to also come to hospital after patient is medically cleared.
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
## 2016-01-24 NOTE — ED Notes (Signed)
Pt initially refusing xray. Pt then agrees to xray. Pt over to xray department then refuses xray. Back to room at this time. MD notified.

## 2016-01-24 NOTE — ED Notes (Signed)
Pt in ultrasound

## 2016-01-24 NOTE — ED Notes (Signed)
Patient transported to X-ray 

## 2016-01-24 NOTE — ED Notes (Signed)
Pt still not in room.   MD aware

## 2016-01-24 NOTE — ED Notes (Signed)
Pt requesting to go downstairs to cafeteria and get food.. Told that she may not leave treatment area if she wanted treatment. Given snacks for children.

## 2016-01-24 NOTE — ED Notes (Signed)
On call SANE nurse, Traci  contacted and informed that patient eloped.
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
## 2016-01-24 NOTE — ED Notes (Signed)
Pt not in room. Called ultrasound and states that they returened patient to room approx 5 minutes ago.
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
## 2016-01-29 ENCOUNTER — Encounter: Payer: Medicaid Other | Admitting: Obstetrics and Gynecology

## 2016-02-06 ENCOUNTER — Ambulatory Visit (INDEPENDENT_AMBULATORY_CARE_PROVIDER_SITE_OTHER): Payer: Medicaid Other | Admitting: Obstetrics and Gynecology

## 2016-02-06 ENCOUNTER — Encounter: Payer: Self-pay | Admitting: Obstetrics and Gynecology

## 2016-02-06 VITALS — BP 103/64 | HR 71 | Wt 150.7 lb

## 2016-02-06 DIAGNOSIS — F17200 Nicotine dependence, unspecified, uncomplicated: Secondary | ICD-10-CM

## 2016-02-06 DIAGNOSIS — Z3492 Encounter for supervision of normal pregnancy, unspecified, second trimester: Secondary | ICD-10-CM

## 2016-02-06 DIAGNOSIS — E6609 Other obesity due to excess calories: Secondary | ICD-10-CM

## 2016-02-06 DIAGNOSIS — Z6830 Body mass index (BMI) 30.0-30.9, adult: Secondary | ICD-10-CM

## 2016-02-06 DIAGNOSIS — Z658 Other specified problems related to psychosocial circumstances: Secondary | ICD-10-CM

## 2016-02-06 DIAGNOSIS — E669 Obesity, unspecified: Secondary | ICD-10-CM | POA: Insufficient documentation

## 2016-02-06 LAB — OB RESULTS CONSOLE HEPATITIS B SURFACE ANTIGEN: Hepatitis B Surface Ag: NEGATIVE

## 2016-02-06 LAB — POCT URINALYSIS DIPSTICK
BILIRUBIN UA: NEGATIVE
Blood, UA: NEGATIVE
Glucose, UA: NEGATIVE
KETONES UA: NEGATIVE
LEUKOCYTES UA: NEGATIVE
NITRITE UA: NEGATIVE
PH UA: 7
PROTEIN UA: NEGATIVE
Spec Grav, UA: 1.015
Urobilinogen, UA: 0.2

## 2016-02-06 LAB — OB RESULTS CONSOLE VARICELLA ZOSTER ANTIBODY, IGG: Varicella: IMMUNE

## 2016-02-06 LAB — OB RESULTS CONSOLE RUBELLA ANTIBODY, IGM: Rubella: IMMUNE

## 2016-02-06 LAB — OB RESULTS CONSOLE HIV ANTIBODY (ROUTINE TESTING): HIV: NONREACTIVE

## 2016-02-06 MED ORDER — NICOTINE 21 MG/24HR TD PT24: 21.0000 mg | MEDICATED_PATCH | Freq: Every day | TRANSDERMAL | 0 refills | Status: DC

## 2016-02-06 MED ORDER — NICOTINE 7 MG/24HR TD PT24: 7.0000 mg | MEDICATED_PATCH | Freq: Every day | TRANSDERMAL | 0 refills | Status: DC

## 2016-02-06 MED ORDER — NICOTINE 14 MG/24HR TD PT24: 14.0000 mg | MEDICATED_PATCH | Freq: Every day | TRANSDERMAL | 0 refills | Status: DC

## 2016-02-06 MED ORDER — FERRALET 90 90-1 MG PO TABS: 1.0000 | ORAL_TABLET | Freq: Every day | ORAL | 6 refills | Status: DC

## 2016-02-06 MED ORDER — CONCEPT DHA 53.5-38-1 MG PO CAPS: 1.0000 | ORAL_CAPSULE | Freq: Every day | ORAL | 8 refills | Status: DC

## 2016-02-06 NOTE — Patient Instructions (Signed)
Pregnancy and Smoking Smoking during pregnancy is unhealthy for you and your developing baby. The addictive drug nicotine, carbon monoxide, and many other poisons are inhaled from a cigarette and carried through your bloodstream to your baby. Cigarette smoke contains more than 2,500 chemicals. It is not known which of these are harmful to a developing baby. However, both nicotine and carbon monoxide play a role in causing health problems in pregnancy. Smoking during pregnancy increases the risk of:  Birth defects in your baby, including heart defects.  Miscarriage and stillbirth.  Birth before 37 completed weeks of pregnancy (premature birth).  Pregnancy outside of the uterus (tubal pregnancy).  Attachment of the placenta over the opening of the uterus (placenta previa).  Detachment of the placenta before the baby's birth (placental abruption).  Breaking of the bag of waters before labor begins (premature rupture of membranes). HOW DOES SMOKING DURING PREGNANCY AFFECT MY BABY? Before Birth Smoking during pregnancy:  Decreases blood flow and oxygen to your baby.  Increases the heart rate of your baby.  Slows your baby's growth in the uterus (intrauterine growth retardation). After Birth Babies born to women who smoke during pregnancy are more likely to have a low birth weight. They are also at risk for:  Serious health problems, chronic or lifelong disabilities (cerebral palsy, mental retardation, learning problems), and death.  Sudden infant death syndrome (SIDS).  Lung and breathing problems. WHAT RESOURCES ARE AVAILABLE TO HELP ME STOP SMOKING?  Ask your health care provider for help to stop smoking. The following resources are available:  Counseling.  Psychological treatment.  Acupuncture.  Family intervention.  Hypnosis.  Nicotine supplements have not been studied enough to know if they are safe to use during pregnancy. They should only be considered when all other  methods fail, and if used under the close supervision of your health care provider.  Telephone QUIT lines. The national smoking cessation telephone hotline number is 1-800-QUIT NOW. FOR MORE INFORMATION  American Cancer Society: www.cancer.org  American Heart Association: www.heart.org  National Cancer Institute: www.cancer.gov  March of D[employer]: www.marchofdimes.org   This information is not intended to replace advice given to you by your health care provider. Make sure you discuss any questions you have with your health care provider.   Document Released: 09/02/2004 Document Revised: 04/26/2013 Document Reviewed: 03/21/2013 Elsevier Interactive Patient Education 2016 [employer]  
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
## 2016-02-06 NOTE — Progress Notes (Signed)
NOB physical- pt is concerned about weight gain, states she lost weight in the summer, otherwise no complaints

## 2016-02-07 LAB — URINALYSIS, ROUTINE W REFLEX MICROSCOPIC
Bilirubin, UA: NEGATIVE
GLUCOSE, UA: NEGATIVE
KETONES UA: NEGATIVE
LEUKOCYTES UA: NEGATIVE
Nitrite, UA: NEGATIVE
PROTEIN UA: NEGATIVE
RBC, UA: NEGATIVE
SPEC GRAV UA: 1.024 (ref 1.005–1.030)
Urobilinogen, Ur: 1 mg/dL (ref 0.2–1.0)
pH, UA: 7 (ref 5.0–7.5)

## 2016-02-07 LAB — MONITOR DRUG PROFILE 14(MW)
AMPHETAMINE SCREEN URINE: NEGATIVE ng/mL
BARBITURATE SCREEN URINE: NEGATIVE ng/mL
BENZODIAZEPINE SCREEN, URINE: NEGATIVE ng/mL
Buprenorphine, Urine: NEGATIVE ng/mL
CANNABINOIDS UR QL SCN: NEGATIVE ng/mL
Cocaine (Metab) Scrn, Ur: NEGATIVE ng/mL
Creatinine(Crt), U: 127.2 mg/dL (ref 20.0–300.0)
FENTANYL, URINE: NEGATIVE pg/mL
MEPERIDINE SCREEN, URINE: NEGATIVE ng/mL
METHADONE SCREEN, URINE: NEGATIVE ng/mL
OXYCODONE+OXYMORPHONE UR QL SCN: NEGATIVE ng/mL
Opiate Scrn, Ur: NEGATIVE ng/mL
Ph of Urine: 6.5 (ref 4.5–8.9)
Phencyclidine Qn, Ur: NEGATIVE ng/mL
Propoxyphene Scrn, Ur: NEGATIVE ng/mL
SPECIFIC GRAVITY: 1.024
TRAMADOL SCREEN, URINE: NEGATIVE ng/mL

## 2016-02-07 LAB — RUBELLA SCREEN: RUBELLA: 9.12 {index} (ref >0.99)

## 2016-02-07 LAB — HIV ANTIBODY (ROUTINE TESTING W REFLEX): HIV SCREEN 4TH GENERATION: NONREACTIVE

## 2016-02-07 LAB — HEPATITIS B SURFACE ANTIGEN: Hepatitis B Surface Ag: NEGATIVE

## 2016-02-07 LAB — RPR: RPR: NONREACTIVE

## 2016-02-07 LAB — SICKLE CELL SCREEN: SICKLE CELL SCREEN: NEGATIVE

## 2016-02-07 LAB — VARICELLA ZOSTER ANTIBODY, IGG: Varicella zoster IgG: 3808 index (ref >165)

## 2016-02-07 LAB — ANTIBODY SCREEN: Antibody Screen: NEGATIVE

## 2016-02-08 LAB — GC/CHLAMYDIA PROBE AMP
CHLAMYDIA, DNA PROBE: NEGATIVE
NEISSERIA GONORRHOEAE BY PCR: NEGATIVE

## 2016-02-08 LAB — URINE CULTURE

## 2016-02-12 ENCOUNTER — Telehealth: Payer: Self-pay | Admitting: Obstetrics and Gynecology

## 2016-02-12 NOTE — Telephone Encounter (Signed)
Notified pt AFP was added to other labs

## 2016-02-12 NOTE — Telephone Encounter (Signed)
This pt said she had some genetic testing done and was calling for results

## 2016-02-15 ENCOUNTER — Telehealth: Payer: Self-pay | Admitting: Obstetrics and Gynecology

## 2016-02-15 NOTE — Telephone Encounter (Signed)
Notified pt of results 

## 2016-02-15 NOTE — Telephone Encounter (Signed)
[name] Young this pt has called twice this wk for results of genetic testing. I do not see them or neither have we received a fax yet. Did she even have a panorama? She asked to talk to you and I told her you were busy.

## 2016-02-19 ENCOUNTER — Encounter: Payer: Self-pay | Admitting: Psychiatry

## 2016-02-19 LAB — HEMOGLOBIN A1C

## 2016-02-25 ENCOUNTER — Emergency Department: Payer: Medicaid Other

## 2016-02-25 ENCOUNTER — Telehealth: Payer: Self-pay

## 2016-02-25 ENCOUNTER — Emergency Department
Admission: EM | Admit: 2016-02-25 | Discharge: 2016-02-25 | Discharge status: Against Medical Advice | Payer: Medicaid Other | Attending: Emergency Medicine | Admitting: Emergency Medicine

## 2016-02-25 DIAGNOSIS — F17210 Nicotine dependence, cigarettes, uncomplicated: Secondary | ICD-10-CM | POA: Diagnosis not present

## 2016-02-25 DIAGNOSIS — Z3A19 19 weeks gestation of pregnancy: Secondary | ICD-10-CM | POA: Diagnosis not present

## 2016-02-25 DIAGNOSIS — O99332 Smoking (tobacco) complicating pregnancy, second trimester: Secondary | ICD-10-CM | POA: Diagnosis not present

## 2016-02-25 DIAGNOSIS — Z79899 Other long term (current) drug therapy: Secondary | ICD-10-CM | POA: Insufficient documentation

## 2016-02-25 DIAGNOSIS — O26892 Other specified pregnancy related conditions, second trimester: Secondary | ICD-10-CM | POA: Diagnosis not present

## 2016-02-25 DIAGNOSIS — R109 Unspecified abdominal pain: Secondary | ICD-10-CM

## 2016-02-25 LAB — CBC
HCT: 25.6 % — ABNORMAL LOW (ref 35.0–47.0)
Hemoglobin: 8.7 g/dL — ABNORMAL LOW (ref 12.0–16.0)
MCH: 28.7 pg (ref 26.0–34.0)
MCHC: 34.1 g/dL (ref 32.0–36.0)
MCV: 84 fL (ref 80.0–100.0)
Platelets: 146 10*3/uL — ABNORMAL LOW (ref 150–440)
RBC: 3.04 MIL/uL — ABNORMAL LOW (ref 3.80–5.20)
RDW: 14.1 % (ref 11.5–14.5)
WBC: 10.2 10*3/uL (ref 3.6–11.0)

## 2016-02-25 LAB — COMPREHENSIVE METABOLIC PANEL
ALBUMIN: 3 g/dL — AB (ref 3.5–5.0)
ALK PHOS: 44 U/L (ref 38–126)
ALT: 11 U/L — ABNORMAL LOW (ref 14–54)
AST: 15 U/L (ref 15–41)
Anion gap: 6 (ref 5–15)
BUN: 11 mg/dL (ref 6–20)
CHLORIDE: 109 mmol/L (ref 101–111)
CO2: 22 mmol/L (ref 22–32)
Calcium: 8.5 mg/dL — ABNORMAL LOW (ref 8.9–10.3)
Creatinine, Ser: 0.6 mg/dL (ref 0.44–1.00)
GFR calc non Af Amer: >60 mL/min (ref >60)
GLUCOSE: 90 mg/dL (ref 65–99)
Potassium: 3.3 mmol/L — ABNORMAL LOW (ref 3.5–5.1)
SODIUM: 137 mmol/L (ref 135–145)
Total Bilirubin: 0.2 mg/dL — ABNORMAL LOW (ref 0.3–1.2)
Total Protein: 6.1 g/dL — ABNORMAL LOW (ref 6.5–8.1)

## 2016-02-25 MED ORDER — SODIUM CHLORIDE 0.9 % IV SOLN
1000.0000 mL | Freq: Once | INTRAVENOUS | Status: AC
  Administered 2016-02-25: 1000 mL via INTRAVENOUS

## 2016-02-25 NOTE — ED Triage Notes (Addendum)
Pt bib EMS w/ c/o abd cramping that she ss feels like contractions.  Pt sts that she is [redacted] weeks pregnant, sts that she has received prenatal care, denies complications w/ other 4 pregnancies.  Pt sts pain began 1600 today.  Denies injury, n/v/d, vag bleeding or spotting SOB or LOC.
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
## 2016-02-25 NOTE — ED Notes (Signed)
Pt states that she needs to leave the ED at 8pm, Pt was advised that all the necessary testing is not going to be completed before 8pm and that if she wants to leave is going to be against medical advise. Pt states that she is going to leave any ways.

## 2016-02-25 NOTE — Telephone Encounter (Signed)
Pt calls office stating she is having severe abdominal pain for past hour, pain last for 2-3 minutes. No c/o vaginal bleeding, v/v, diarrhea, or fever. I could hear in pts voice that she was in pain. Pt encouraged to have someone take her to ER and if they cannot be reached call EMS. (Pt stated that she could not reach her husband and had no way to get there.) On call provider notified.

## 2016-02-26 ENCOUNTER — Telehealth: Payer: Self-pay | Admitting: Emergency Medicine

## 2016-02-26 NOTE — Telephone Encounter (Signed)
Called [facility] and they will have dr cherry review the ultrasound done prior to when patient left [place] yesterday.

## 2016-03-05 LAB — AFP, QUAD SCREEN
DIA Mom Value: 0.63
DIA VALUE (EIA): 111.28 pg/mL
DSR (By Age)    1 IN: 724
DSR (Second Trimester) 1 IN: 1850
Gestational Age: 16.4 WEEKS
MSAFP MOM: 0.87
MSAFP: 33.1 ng/mL
MSHCG MOM: 0.74
MSHCG: 26486 m[IU]/mL
Maternal Age At EDD: 29.7 YEARS
Osb Risk: 10000
TEST RESULTS AFP: NEGATIVE
Weight: 150 [lb_av]
uE3 Mom: 0.52
uE3 Value: 0.47 ng/mL

## 2016-03-05 LAB — SPECIMEN STATUS REPORT

## 2016-03-11 ENCOUNTER — Encounter: Payer: Self-pay | Admitting: Obstetrics and Gynecology

## 2016-03-11 ENCOUNTER — Other Ambulatory Visit: Payer: Self-pay

## 2016-03-25 ENCOUNTER — Other Ambulatory Visit: Payer: Self-pay

## 2016-03-25 ENCOUNTER — Encounter: Payer: Self-pay | Admitting: Obstetrics and Gynecology

## 2016-04-23 LAB — OB RESULTS CONSOLE GC/CHLAMYDIA
Chlamydia: NEGATIVE
GC PROBE AMP, GENITAL: NEGATIVE

## 2016-05-05 NOTE — L&D Delivery Note (Signed)
VAGINAL DELIVERY NOTE:  Date of Delivery: 07/19/2016 Primary OB: [facility] Gestational Age/EDD: 5594w0d 07/19/2016, by Last Menstrual Period Antepartum complications:Hx of drug abuse Attending Physician:CJones, CNM  Delivery Type: spontaneous vaginal delivery  Anesthesia: none Laceration: n/a Episiotomy: none Placenta: spontaneous Intrapartum complications:  Estimated Blood Loss:200 ml's GBS:pos Procedure Details:CTSP and pt in lithotomy pos pushing with Vtx del, Ant shoulder and post shoulder and body del at 1924 and to mom's abd crying, wiped and dried. Delayed cord clamping with CCx2 and cut per dad, 3VC, No CAN at any time noted. Cord blood sent.Baby to warmer per mom's request. SDOP intact at  2128 intact, sl mec stained on the sac. Perineum intact. VSS. Hemostasis achieved. IV came out as Pitocin was running. No heavy bleeding. Will observe closely and place IV if needed. Bonding with baby. No needles and no sponges used and count correct as 0.  Baby: Liveborn Boy, Apgars 8,9, weight 7#,11 oz, baby named "[name]" Sharee Pimplearon W. Layli Capshaw, MSN, CNM, FNP

## 2016-05-19 ENCOUNTER — Ambulatory Visit
Admission: EM | Admit: 2016-05-19 | Discharge: 2016-05-19 | Disposition: A | Discharge status: Home / Self Care | Payer: Medicaid Other | Attending: Family Medicine | Admitting: Family Medicine

## 2016-05-19 DIAGNOSIS — O2343 Unspecified infection of urinary tract in pregnancy, third trimester: Secondary | ICD-10-CM | POA: Diagnosis not present

## 2016-05-19 DIAGNOSIS — Z9889 Other specified postprocedural states: Secondary | ICD-10-CM | POA: Diagnosis not present

## 2016-05-19 DIAGNOSIS — O99343 Other mental disorders complicating pregnancy, third trimester: Secondary | ICD-10-CM | POA: Diagnosis not present

## 2016-05-19 DIAGNOSIS — Z3A31 31 weeks gestation of pregnancy: Secondary | ICD-10-CM | POA: Diagnosis not present

## 2016-05-19 DIAGNOSIS — O99513 Diseases of the respiratory system complicating pregnancy, third trimester: Secondary | ICD-10-CM | POA: Diagnosis not present

## 2016-05-19 DIAGNOSIS — O99323 Drug use complicating pregnancy, third trimester: Secondary | ICD-10-CM | POA: Insufficient documentation

## 2016-05-19 DIAGNOSIS — O99213 Obesity complicating pregnancy, third trimester: Secondary | ICD-10-CM | POA: Insufficient documentation

## 2016-05-19 DIAGNOSIS — O99333 Smoking (tobacco) complicating pregnancy, third trimester: Secondary | ICD-10-CM | POA: Insufficient documentation

## 2016-05-19 DIAGNOSIS — R69 Illness, unspecified: Secondary | ICD-10-CM | POA: Diagnosis not present

## 2016-05-19 DIAGNOSIS — J111 Influenza due to unidentified influenza virus with other respiratory manifestations: Secondary | ICD-10-CM | POA: Diagnosis not present

## 2016-05-19 DIAGNOSIS — Z79899 Other long term (current) drug therapy: Secondary | ICD-10-CM | POA: Insufficient documentation

## 2016-05-19 DIAGNOSIS — N390 Urinary tract infection, site not specified: Secondary | ICD-10-CM

## 2016-05-19 LAB — URINALYSIS, COMPLETE (UACMP) WITH MICROSCOPIC
BILIRUBIN URINE: NEGATIVE
Glucose, UA: NEGATIVE mg/dL
Hgb urine dipstick: NEGATIVE
KETONES UR: NEGATIVE mg/dL
Leukocytes, UA: NEGATIVE
Nitrite: NEGATIVE
PROTEIN: 30 mg/dL — AB
Specific Gravity, Urine: 1.02 (ref 1.005–1.030)
pH: 7 (ref 5.0–8.0)

## 2016-05-19 LAB — RAPID INFLUENZA A&B ANTIGENS: Influenza B (ARMC): NEGATIVE

## 2016-05-19 LAB — RAPID INFLUENZA A&B ANTIGENS (ARMC ONLY): INFLUENZA A (ARMC): NEGATIVE

## 2016-05-19 LAB — RAPID STREP SCREEN (MED CTR MEBANE ONLY): Streptococcus, Group A Screen (Direct): NEGATIVE

## 2016-05-19 MED ORDER — OSELTAMIVIR PHOSPHATE 75 MG PO CAPS: 75.0000 mg | ORAL_CAPSULE | Freq: Two times a day (BID) | ORAL | 0 refills | Status: DC

## 2016-05-19 MED ORDER — CEPHALEXIN 500 MG PO CAPS: 500.0000 mg | ORAL_CAPSULE | Freq: Two times a day (BID) | ORAL | 0 refills | Status: AC
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
## 2016-05-19 NOTE — ED Triage Notes (Signed)
Patient complains of cough and fatigue with chills. Patient reports that she was exposed to flu through family members. Patient states that she is [redacted] weeks pregnant.

## 2016-05-19 NOTE — Discharge Instructions (Signed)
Take medication as prescribed. Rest. Drink plenty of fluids.   Follow up with your OBGYN tomorrow as scheduled.   Follow up with your primary care physician this week as needed. Return to Urgent care for new or worsening concerns.

## 2016-05-19 NOTE — ED Notes (Signed)
Pt refused fetal heart tones. Her child was asleep on her lap and she refused to wake him.

## 2016-05-21 LAB — URINE CULTURE: Culture: 80000 — AB

## 2016-05-22 LAB — CULTURE, GROUP A STREP (THRC)

## 2016-05-28 ENCOUNTER — Other Ambulatory Visit: Payer: Self-pay | Admitting: Family Medicine

## 2016-05-28 DIAGNOSIS — Z3483 Encounter for supervision of other normal pregnancy, third trimester: Secondary | ICD-10-CM

## 2016-06-01 ENCOUNTER — Other Ambulatory Visit
Admission: EM | Admit: 2016-06-01 | Discharge: 2016-06-01 | Disposition: A | Discharge status: Home / Self Care | Attending: Family Medicine | Admitting: Family Medicine
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
## 2016-06-03 ENCOUNTER — Ambulatory Visit
Admission: RE | Admit: 2016-06-03 | Discharge: 2016-06-03 | Disposition: A | Discharge status: Home / Self Care | Payer: Medicaid Other | Source: Ambulatory Visit | Attending: Family Medicine | Admitting: Family Medicine

## 2016-06-03 DIAGNOSIS — Z3A34 34 weeks gestation of pregnancy: Secondary | ICD-10-CM | POA: Diagnosis not present

## 2016-06-03 DIAGNOSIS — Z3483 Encounter for supervision of other normal pregnancy, third trimester: Secondary | ICD-10-CM | POA: Diagnosis not present

## 2016-06-04 ENCOUNTER — Ambulatory Visit: Payer: Medicaid Other

## 2016-06-12 ENCOUNTER — Other Ambulatory Visit: Payer: Self-pay | Admitting: Obstetrics and Gynecology

## 2016-06-12 LAB — OB RESULTS CONSOLE RPR: RPR: NONREACTIVE

## 2016-07-08 ENCOUNTER — Encounter: Payer: Medicaid Other | Admitting: Certified Nurse Midwife

## 2016-07-12 ENCOUNTER — Inpatient Hospital Stay
Admission: EM | Admit: 2016-07-12 | Discharge: 2016-07-12 | Discharge status: Against Medical Advice | Admitting: Obstetrics and Gynecology

## 2016-07-12 ENCOUNTER — Encounter: Payer: Self-pay | Admitting: Obstetrics and Gynecology

## 2016-07-12 LAB — OB RESULTS CONSOLE GBS: STREP GROUP B AG: POSITIVE

## 2016-07-12 NOTE — Progress Notes (Signed)
Called to patient's room.  Upon entry, I introduced myself and pt immediately replied with rapid, pressured speech and continuous stream of a litany of complaints.  Yelling at this writer that she had been here for 3 hours and not seen a doctor and that she, "Don't want that bitch to touch me." about the nurse who had been assigned to her.  She then complained that I hadn't even answered any of her questions even though she had not yet taken a pause and allowed me to reply.  She then started repeatedly yelling at me, "You're a racist!  You're a racist, Bitch!"  I explained to her that that type of language would not be tolerated within this facility.  She then replied, "Fuck you, Bitch!  Go ahead and call security on me!"  I then called security to assist her safely out of the building.  She refused to sign the AMA form and took the urine specimen she had given and dumped it into the sink.  She walked off the unit and Security personnel accompanied her to the visitors entrance for safe exit.

## 2016-07-12 NOTE — Progress Notes (Signed)
Patient came in via EMS at 0235 and placed in Obs 1 complaining of contractions every 10 minutes since 0100. Initially, stated she was a patient of [name] [name]. Patient denied LOF, vaginal bleeding and stated she felt occasional fetal movement. Urine sample provided,monitors applied and assessment performed. Fetal movement felt by [name] [name] at this time and patient verbalized that she had felt the baby move as well. Patient pleasant and did not appear to be in any acute distress at this time. Patient stated that she was checked in office on 07/09/16 and was 2 cm. Cervical exam by this [name] [name] completed and patient 3cm at this time. Patient then revised previous statement and said she was actually checked at the office 07/10/16 and was 4-5cm and that her membranes were stripped. Reassured patient and explained that [name] [name] would be notified of her arrival as she was a [name] Real patient and would be seen by [name] [name]. [name] [name] paged with no response and then called on cell phone with no response.  Patient called out at this time complaining of "contractions close together". On assessment of FHR strip contractions did not appear to be changing in interval but attempted to explain to patient that contractions could increase after cervical exam and we would continue to monitor them.  [name] [name] again paged with no response.[name] [name], Charge [name] [name]  notified and attempted to call and then text [name] [name] with no response.  Patient then called out again complaining that no doctor had been in to see her yet. Patient stated that "she was 4-5 cm in the office and wasn't lying" and that "she wouldn't leave her babies at home unless she was really hurting". Patient also stated she felt confused as to why cervical exam was different. Offered to recheck patient or have another [name] [name] check patient to verify previous cervical exam. Patient agrees to have this [name] [name] check patient, remained unchanged at this  time. Patient then stated that she wasn't feeling the baby move. Patient placed on side and given apple juice as well as fetal movement marker to record the movement she was feeling, if any, fetal movements. Patient proceeded to mark several instances of fetal movement. Patient then stated that she would like to stay because she was due to be induced this Monday. When this [name] [name] asked about induction, patient revised story and stated she was being induced next Monday, and then stated she was being induced next Saturday. Reassured patient and explained that we were still waiting to hear back from MD at this time.   Upon further review of her records patient had had some prenatal information faxed to [name] [name]. Discussed with [name] [name] and [name] [name] notified to clarify who is responsible for this patient.  Patient called out on call bell again, sounding agitated and stated that she had been here for 3 hours and needed to be seen by a doctor because we were not doing enough for her. Attempted to reassure patient and reminded her that while she had arrived only an hour previously that her baby had a reassuring FHR and we were doing our best to get in touch with her MD. Patient became defiant and refused to listen to [name] [name], began yelling through the call bell that she was going to another [name], and that the staff was ignoring her needs. [name] [name], [name] [name] in to see patient. Patient refused to let her speak and continued to yell and use profanities. Attempted to address concerns  and show patient on monitor the reassuring FHR. Asked patient about discrepancy in providers and patient stated that she saw both [name] [name] as well as [name] [name]. Patient refused to listen to [name] [name] and proceeded to use profane language to accuse [name] [name] of discrimination and inadequate care. [name] [name] [name] [name], [name] [name] called to floor at this time. While [name] [name] in to see patient, [name] [name], [name] [name] called and  notified of patient and backstory. [name] [name] stated that patient had made appointment previously with [name] [name] but did not show. At this point patient was leaving unit, however [name] [name] asked that urine be sent for UDS due to history of drug abuse and patient's erratic behavior.  [name] [name], [name] [name] in to speak with patient at this time. Patient belligerent, using profanity and calling the staff racists. Patient refused to sign AMA form and was escorted off unit at this time in stable condition by security to visitors entrance where her ride planned on meeting her. After patient off unit, this [name] [name] went to retrieve urine sample patient had left on counter. Upon entering the room, it appeared patient had dumped sample down sink as the cup and lid were in the trash and the sink faucet was running.

## 2016-07-12 NOTE — OB Triage Note (Signed)
Patient came in complaining of painful contractions every 7-10 minutes since 0100. 5/10 pain in back and some in lower abdomen. Reports feeling infant move only occasionally.Denies vaginal bleeding, LOF. Monitors applied.

## 2016-07-12 NOTE — Progress Notes (Signed)
At 0630 this morning I checked my phone from the call room and found 2 voicemails from labor and delivery stating no return answer from paging regarding this patient, though my phone indicates no calls and no pages, and no call to the call room. The updated contact list on L&D had the wrong pager number, which has been fixed.  I reviewed the nursing notes and FHT - Cat I strip with accels and intermittent contractions.   Pt did leave AMA prior to provider visit, but nursing staff documented cervical dilation at 3cm and reassuring fetal heart rate strip. She did flush her urine collection prior to leaving.  There is documentation from [facility] from a pregnancy in 2013, another in 2016, [facility] in both instances. This pregnancy there is documentation of establishing care with [facility] on 10/4/7, and Phineas RealCharles Drew has ordered ultrasound imaging for her during this pregnancy. She was referred by [facility] to [facility] but I do not find records from this during this pregnancy.  Her pregnancy has also been complicated by polysubstance abuse, hx of incarceration, major depressive disorder, bipolar disorder, hx of suicidality tobacco abuse, history of ectopic pregnancy in 2007, abnormal pap smear (ASCUS, +HPV).    She is scheduled on the books for [facility] next week from the [facility], though we have not yet seen her in clinic.  Her dating for this pregnancy appears to be good, with a [id number] scan on 11/24/15 that matches with an LMP of 6/101/7, placing her EDC at 07/19/16. She is 39+0 today.

## 2016-07-15 ENCOUNTER — Other Ambulatory Visit: Payer: Self-pay | Admitting: Obstetrics and Gynecology

## 2016-07-16 ENCOUNTER — Emergency Department
Admission: EM | Admit: 2016-07-16 | Discharge: 2016-07-16 | Disposition: A | Discharge status: Home / Self Care | Payer: Medicaid Other | Attending: Emergency Medicine | Admitting: Emergency Medicine

## 2016-07-16 ENCOUNTER — Inpatient Hospital Stay
Admit: 2016-07-16 | Discharge: 2016-07-16 | Disposition: A | Discharge status: Home / Self Care | Payer: Medicaid Other | Attending: Obstetrics and Gynecology | Admitting: Obstetrics and Gynecology

## 2016-07-16 ENCOUNTER — Encounter: Payer: Self-pay | Admitting: *Deleted

## 2016-07-16 ENCOUNTER — Other Ambulatory Visit: Payer: Self-pay | Admitting: Obstetrics and Gynecology

## 2016-07-16 ENCOUNTER — Telehealth: Payer: Self-pay | Admitting: Obstetrics and Gynecology

## 2016-07-16 DIAGNOSIS — O99333 Smoking (tobacco) complicating pregnancy, third trimester: Secondary | ICD-10-CM | POA: Diagnosis not present

## 2016-07-16 DIAGNOSIS — O26893 Other specified pregnancy related conditions, third trimester: Secondary | ICD-10-CM

## 2016-07-16 DIAGNOSIS — Z3A39 39 weeks gestation of pregnancy: Secondary | ICD-10-CM

## 2016-07-16 DIAGNOSIS — I471 Supraventricular tachycardia: Secondary | ICD-10-CM

## 2016-07-16 DIAGNOSIS — O99413 Diseases of the circulatory system complicating pregnancy, third trimester: Secondary | ICD-10-CM | POA: Insufficient documentation

## 2016-07-16 DIAGNOSIS — M545 Low back pain: Secondary | ICD-10-CM | POA: Insufficient documentation

## 2016-07-16 DIAGNOSIS — Z5181 Encounter for therapeutic drug level monitoring: Secondary | ICD-10-CM | POA: Insufficient documentation

## 2016-07-16 DIAGNOSIS — F17210 Nicotine dependence, cigarettes, uncomplicated: Secondary | ICD-10-CM | POA: Insufficient documentation

## 2016-07-16 DIAGNOSIS — O479 False labor, unspecified: Secondary | ICD-10-CM

## 2016-07-16 LAB — CBC
HCT: 28.3 % — ABNORMAL LOW (ref 35.0–47.0)
HEMOGLOBIN: 9.4 g/dL — AB (ref 12.0–16.0)
MCH: 28.2 pg (ref 26.0–34.0)
MCHC: 33.4 g/dL (ref 32.0–36.0)
MCV: 84.3 fL (ref 80.0–100.0)
PLATELETS: 218 10*3/uL (ref 150–440)
RBC: 3.35 MIL/uL — AB (ref 3.80–5.20)
RDW: 13.9 % (ref 11.5–14.5)
WBC: 9 10*3/uL (ref 3.6–11.0)

## 2016-07-16 LAB — BASIC METABOLIC PANEL
Anion gap: 6 (ref 5–15)
BUN: 7 mg/dL (ref 6–20)
CHLORIDE: 108 mmol/L (ref 101–111)
CO2: 21 mmol/L — AB (ref 22–32)
CREATININE: 0.61 mg/dL (ref 0.44–1.00)
Calcium: 8.6 mg/dL — ABNORMAL LOW (ref 8.9–10.3)
GFR calc non Af Amer: >60 mL/min (ref >60)
Glucose, Bld: 83 mg/dL (ref 65–99)
Potassium: 3.8 mmol/L (ref 3.5–5.1)
Sodium: 135 mmol/L (ref 135–145)

## 2016-07-16 LAB — WET PREP, GENITAL
SPERM: NONE SEEN
Trich, Wet Prep: NONE SEEN
Yeast Wet Prep HPF POC: NONE SEEN

## 2016-07-16 LAB — URINE DRUG SCREEN, QUALITATIVE (ARMC ONLY)
AMPHETAMINES, UR SCREEN: NOT DETECTED
Barbiturates, Ur Screen: NOT DETECTED
Benzodiazepine, Ur Scrn: NOT DETECTED
COCAINE METABOLITE, UR ~~LOC~~: NOT DETECTED
Cannabinoid 50 Ng, Ur ~~LOC~~: NOT DETECTED
MDMA (Ecstasy)Ur Screen: NOT DETECTED
Methadone Scn, Ur: NOT DETECTED
OPIATE, UR SCREEN: NOT DETECTED
PHENCYCLIDINE (PCP) UR S: NOT DETECTED
Tricyclic, Ur Screen: NOT DETECTED

## 2016-07-16 LAB — TROPONIN I: Troponin I: <0.03 ng/mL (ref <0.03)

## 2016-07-16 MED ORDER — MORPHINE SULFATE (PF) 2 MG/ML IV SOLN: 2.0000 mg | Freq: Once | INTRAVENOUS | Status: DC

## 2016-07-16 MED ORDER — METRONIDAZOLE 500 MG PO TABS: 500.0000 mg | ORAL_TABLET | Freq: Two times a day (BID) | ORAL | 0 refills | Status: DC

## 2016-07-16 MED ORDER — ONDANSETRON HCL 4 MG/2ML IJ SOLN: 4.0000 mg | Freq: Once | INTRAMUSCULAR | Status: DC

## 2016-07-16 MED ORDER — ACETAMINOPHEN 325 MG PO TABS
ORAL_TABLET | ORAL | Status: AC
  Administered 2016-07-16: 650 mg via ORAL
  Filled 2016-07-16: qty 2

## 2016-07-16 MED ORDER — CALCIUM CARBONATE ANTACID 500 MG PO CHEW: 2.0000 | CHEWABLE_TABLET | ORAL | Status: DC | PRN

## 2016-07-16 MED ORDER — DOCUSATE SODIUM 100 MG PO CAPS: 100.0000 mg | ORAL_CAPSULE | Freq: Every day | ORAL | Status: DC

## 2016-07-16 MED ORDER — ZOLPIDEM TARTRATE 5 MG PO TABS: 5.0000 mg | ORAL_TABLET | Freq: Every evening | ORAL | Status: DC | PRN

## 2016-07-16 MED ORDER — SODIUM CHLORIDE FLUSH 0.9 % IV SOLN
INTRAVENOUS | Status: AC
  Filled 2016-07-16: qty 10

## 2016-07-16 MED ORDER — SODIUM CHLORIDE 0.9 % IV BOLUS (SEPSIS): 1000.0000 mL | Freq: Once | INTRAVENOUS | Status: DC

## 2016-07-16 MED ORDER — PRENATAL MULTIVITAMIN CH
1.0000 | ORAL_TABLET | Freq: Every day | ORAL | Status: DC
  Filled 2016-07-16: qty 1

## 2016-07-16 MED ORDER — ACETAMINOPHEN 325 MG PO TABS
650.0000 mg | ORAL_TABLET | Freq: Once | ORAL | Status: AC
  Administered 2016-07-16: 650 mg via ORAL

## 2016-07-16 MED ORDER — ACETAMINOPHEN 325 MG PO TABS: 650.0000 mg | ORAL_TABLET | ORAL | Status: DC | PRN

## 2016-07-16 MED ORDER — ACETAMINOPHEN 325 MG PO TABS
650.0000 mg | ORAL_TABLET | Freq: Four times a day (QID) | ORAL | Status: DC | PRN
  Administered 2016-07-16: 650 mg via ORAL

## 2016-07-16 MED ORDER — ACETAMINOPHEN 325 MG PO TABS
ORAL_TABLET | ORAL | Status: AC
  Filled 2016-07-16: qty 2

## 2016-07-16 NOTE — OB Triage Note (Signed)
Pt arrived to LD triage from ER, cleared after being seen and treated for SVT, report received from ER RN. Adenosine 6mg  given. Pt cleared in ER and now on LD unit for fetal monitoring, states she has on constant back pain, rates 8/10. Was given pain med in ER, says that was hours ago. Confirms +FM, denies spotting, leaking fluid or n/v. Plan for triage stay explained to pt. Advised of s/s to report if noticed while in triage.

## 2016-07-16 NOTE — Telephone Encounter (Signed)
LVM to tell her about bacterial vaginosis, and I called in Flagyl 500mg  BID x 7 days to the [facility] on [address].    Carlean JewsMeredith Sigmon, CNM

## 2016-07-16 NOTE — Progress Notes (Signed)
Dr Dalbert GarnetBeasley at bedside for exam, 3/50/-3, wet prep specimen collected. MD speaking with pt about plan, will check specimen and see what results, will treat if needed. Discharge instructions explained to pt per Dr Dalbert GarnetBeasley,  encouraged pt to keep scheduled appt for induction on Sat or sooner if laboring or rupture. Will call pt with results of wet prep and obtained correct pharmacy info in the event a rx needs to be called in to her pharmacy. Pt contact # [phone]

## 2016-07-16 NOTE — OB Triage Provider Note (Signed)
TRIAGE VISIT with NST   [name] Young is a 31 y.o. [id number]. She is at 7327w4d gestation, presenting with signs of labor. She came to the ER by EMS for heart racing and found to be in SVT with HR in the 200s. She was converted to NSR with adenosine, watched by them, and brought to triage for monitoring.  Indication: Contractions  S: Resting comfortably. Irregular CTX, no VB. Active fetal movement. Concerned about lower back pain.  O:  LMP 10/13/2015  Results for orders placed or performed during the hospital encounter of 07/16/16 (from the past 48 hour(s))  Basic metabolic panel   Collection Time: 07/16/16 12:46 AM  Result Value Ref Range   Sodium 135 135 - 145 mmol/L   Potassium 3.8 3.5 - 5.1 mmol/L   Chloride 108 101 - 111 mmol/L   CO2 21 (L) 22 - 32 mmol/L   Glucose, Bld 83 65 - 99 mg/dL   BUN 7 6 - 20 mg/dL   Creatinine, Ser 4.090.61 0.44 - 1.00 mg/dL   Calcium 8.6 (L) 8.9 - 10.3 mg/dL   GFR calc non Af Amer >60 >60 mL/min   GFR calc Af Amer >60 >60 mL/min   Anion gap 6 5 - 15  CBC   Collection Time: 07/16/16 12:46 AM  Result Value Ref Range   WBC 9.0 3.6 - 11.0 K/uL   RBC 3.35 (L) 3.80 - 5.20 MIL/uL   Hemoglobin 9.4 (L) 12.0 - 16.0 g/dL   HCT 81.128.3 (L) 91.435.0 - 78.247.0 %   MCV 84.3 80.0 - 100.0 fL   MCH 28.2 26.0 - 34.0 pg   MCHC 33.4 32.0 - 36.0 g/dL   RDW 95.613.9 21.311.5 - 08.614.5 %   Platelets 218 150 - 440 K/uL  Troponin I   Collection Time: 07/16/16 12:46 AM  Result Value Ref Range   Troponin I <0.03 <0.03 ng/mL  Urine Drug Screen, Qualitative ([facility] only)   Collection Time: 07/16/16  1:08 AM  Result Value Ref Range   Tricyclic, Ur Screen NONE DETECTED NONE DETECTED   Amphetamines, Ur Screen NONE DETECTED NONE DETECTED   MDMA (Ecstasy)Ur Screen NONE DETECTED NONE DETECTED   Cocaine Metabolite,Ur [place] NONE DETECTED NONE DETECTED   Opiate, Ur Screen NONE DETECTED NONE DETECTED   Phencyclidine (PCP) Ur S NONE DETECTED NONE DETECTED   Cannabinoid 50 Ng, Ur  NONE DETECTED NONE  DETECTED   Barbiturates, Ur Screen NONE DETECTED NONE DETECTED   Benzodiazepine, Ur Scrn NONE DETECTED NONE DETECTED   Methadone Scn, Ur NONE DETECTED NONE DETECTED     Gen: NAD, AAOx3      Abd: FNTTP      Ext: Non-tender, Nonedmeatous    NST/FHT: 120, mod var, +accels, no decels TOCO: quiet SVE:  3/50/-3 - white discharge NST: Reactive. See FHT above for particulars.  A/P:  31 y.o. [id number] 1027w4d with contractions at term.   Labor: not present. Tylenol 650mg  given for lower back pain  R/o ROM: SSE negative x 3. Wet prep sent. Normal amount of white discharge.  Fetal Wellbeing: NST reactive Reassuring Cat 1 tracing.  D/c home stable, precautions reviewed, follow-up as scheduled for induction this weekend.

## 2016-07-16 NOTE — ED Notes (Signed)
Pt medically cleared and discharged in order to be admitted to L&D.

## 2016-07-16 NOTE — ED Triage Notes (Signed)
Pt states she woke at 2350 w/ chest pain and rapid heartbeat. Pt states she was short or breath at that time. EMS arrived, started PIV and administered adenosine. Pt is presently still having chest pain, decreased. Pt is no longer in respiratory distress. Pt is G4P39A0.

## 2016-07-16 NOTE — ED Notes (Signed)
Report off to kailey rn  
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
## 2016-07-16 NOTE — OB Triage Note (Signed)
0600 - Pt says she still feels the same pain in lower back, no relief since Tylenol po, tolerable and agrees occasional contractions are spaced out and not regular. Pt requesting to speak with supervisor, Helmut MusterAlicia. RN returned to room with discharge paperwork, pt sitting on side of bed, on phone speaking to family about getting a ride home in clear complete sentences, no grimacing or signs of distress. Says her father will come get her for ride home. Patient provided with written and verbal discharge instructions, advised to return if any signs of active labor, dec fetal mvmt, leaking or gush of fluid, regular painful contractions, otherwise pt encouraged to keep any scheduled OB clinic appts this week and return to hospital on Saturday for scheduled labor induction. Understanding verbalized. Pt out of room and escorted to waiting area by hospital security.

## 2016-07-19 ENCOUNTER — Inpatient Hospital Stay: Payer: Medicaid Other | Admitting: Anesthesiology

## 2016-07-19 ENCOUNTER — Encounter: Payer: Self-pay | Admitting: *Deleted

## 2016-07-19 ENCOUNTER — Inpatient Hospital Stay
Admission: EM | Admit: 2016-07-19 | Discharge: 2016-07-21 | DRG: 775 | Disposition: A | Discharge status: Home / Self Care | Payer: Medicaid Other | Attending: Obstetrics and Gynecology | Admitting: Obstetrics and Gynecology

## 2016-07-19 DIAGNOSIS — O99334 Smoking (tobacco) complicating childbirth: Secondary | ICD-10-CM | POA: Diagnosis present

## 2016-07-19 DIAGNOSIS — O99344 Other mental disorders complicating childbirth: Secondary | ICD-10-CM | POA: Diagnosis present

## 2016-07-19 DIAGNOSIS — O99824 Streptococcus B carrier state complicating childbirth: Secondary | ICD-10-CM | POA: Diagnosis present

## 2016-07-19 DIAGNOSIS — F319 Bipolar disorder, unspecified: Secondary | ICD-10-CM | POA: Diagnosis present

## 2016-07-19 DIAGNOSIS — Z3A40 40 weeks gestation of pregnancy: Secondary | ICD-10-CM

## 2016-07-19 DIAGNOSIS — Z3493 Encounter for supervision of normal pregnancy, unspecified, third trimester: Secondary | ICD-10-CM | POA: Diagnosis present

## 2016-07-19 DIAGNOSIS — F17210 Nicotine dependence, cigarettes, uncomplicated: Secondary | ICD-10-CM | POA: Diagnosis present

## 2016-07-19 DIAGNOSIS — F3172 Bipolar disorder, in full remission, most recent episode hypomanic: Secondary | ICD-10-CM | POA: Diagnosis not present

## 2016-07-19 LAB — TYPE AND SCREEN
ABO/RH(D): O POS
ANTIBODY SCREEN: NEGATIVE

## 2016-07-19 LAB — CBC
HEMATOCRIT: 28 % — AB (ref 35.0–47.0)
HEMOGLOBIN: 9.6 g/dL — AB (ref 12.0–16.0)
MCH: 28.8 pg (ref 26.0–34.0)
MCHC: 34.1 g/dL (ref 32.0–36.0)
MCV: 84.3 fL (ref 80.0–100.0)
Platelets: 206 10*3/uL (ref 150–440)
RBC: 3.32 MIL/uL — ABNORMAL LOW (ref 3.80–5.20)
RDW: 14.3 % (ref 11.5–14.5)
WBC: 7.9 10*3/uL (ref 3.6–11.0)

## 2016-07-19 MED ORDER — FENTANYL 2.5 MCG/ML W/ROPIVACAINE 0.2% IN NS 100 ML EPIDURAL INFUSION (ARMC-ANES)
EPIDURAL | Status: DC | PRN
  Administered 2016-07-19: 10 mL/h via EPIDURAL

## 2016-07-19 MED ORDER — FENTANYL 2.5 MCG/ML W/ROPIVACAINE 0.2% IN NS 100 ML EPIDURAL INFUSION (ARMC-ANES)
EPIDURAL | Status: AC
  Filled 2016-07-19: qty 100

## 2016-07-19 MED ORDER — PRENATAL MULTIVITAMIN CH
1.0000 | ORAL_TABLET | Freq: Every day | ORAL | Status: DC
  Filled 2016-07-19: qty 1

## 2016-07-19 MED ORDER — SODIUM CHLORIDE 0.9 % IV SOLN: 250.0000 mL | INTRAVENOUS | Status: DC | PRN

## 2016-07-19 MED ORDER — NALOXONE HCL 0.4 MG/ML IJ SOLN: 0.4000 mg | INTRAMUSCULAR | Status: DC | PRN

## 2016-07-19 MED ORDER — LIDOCAINE HCL (PF) 1 % IJ SOLN
INTRAMUSCULAR | Status: AC
  Filled 2016-07-19: qty 30

## 2016-07-19 MED ORDER — SODIUM CHLORIDE 0.9 % IV SOLN
1.0000 g | INTRAVENOUS | Status: DC
  Administered 2016-07-19 (×2): 1 g via INTRAVENOUS
  Filled 2016-07-19 (×6): qty 1000

## 2016-07-19 MED ORDER — COCONUT OIL OIL: 1.0000 "application " | TOPICAL_OIL | Status: DC | PRN

## 2016-07-19 MED ORDER — TETANUS-DIPHTH-ACELL PERTUSSIS 5-2.5-18.5 LF-MCG/0.5 IM SUSP: 0.5000 mL | Freq: Once | INTRAMUSCULAR | Status: DC

## 2016-07-19 MED ORDER — NALBUPHINE HCL 10 MG/ML IJ SOLN: 5.0000 mg | INTRAMUSCULAR | Status: DC | PRN

## 2016-07-19 MED ORDER — NALBUPHINE HCL 10 MG/ML IJ SOLN: 5.0000 mg | Freq: Once | INTRAMUSCULAR | Status: DC | PRN

## 2016-07-19 MED ORDER — DIPHENHYDRAMINE HCL 25 MG PO CAPS: 25.0000 mg | ORAL_CAPSULE | ORAL | Status: DC | PRN

## 2016-07-19 MED ORDER — MEASLES, MUMPS & RUBELLA VAC ~~LOC~~ INJ
0.5000 mL | INJECTION | Freq: Once | SUBCUTANEOUS | Status: DC
  Filled 2016-07-19: qty 0.5

## 2016-07-19 MED ORDER — SOD CITRATE-CITRIC ACID 500-334 MG/5ML PO SOLN
30.0000 mL | ORAL | Status: DC | PRN
  Filled 2016-07-19: qty 30

## 2016-07-19 MED ORDER — ACETAMINOPHEN 325 MG PO TABS: 650.0000 mg | ORAL_TABLET | ORAL | Status: DC | PRN

## 2016-07-19 MED ORDER — LACTATED RINGERS IV SOLN
500.0000 mL | INTRAVENOUS | Status: DC | PRN
  Administered 2016-07-19: 1000 mL via INTRAVENOUS

## 2016-07-19 MED ORDER — KETOROLAC TROMETHAMINE 30 MG/ML IJ SOLN
30.0000 mg | Freq: Four times a day (QID) | INTRAMUSCULAR | Status: DC | PRN
Start: 2016-07-19 — End: 2016-07-19

## 2016-07-19 MED ORDER — FENTANYL 2.5 MCG/ML W/ROPIVACAINE 0.2% IN NS 100 ML EPIDURAL INFUSION (ARMC-ANES): 10.0000 mL/h | EPIDURAL | Status: DC

## 2016-07-19 MED ORDER — MISOPROSTOL 200 MCG PO TABS
ORAL_TABLET | ORAL | Status: AC
  Filled 2016-07-19: qty 4

## 2016-07-19 MED ORDER — SODIUM CHLORIDE 0.9% FLUSH: 3.0000 mL | INTRAVENOUS | Status: DC | PRN

## 2016-07-19 MED ORDER — LIDOCAINE-EPINEPHRINE (PF) 1.5 %-1:200000 IJ SOLN
INTRAMUSCULAR | Status: DC | PRN
  Administered 2016-07-19: 3 mL via EPIDURAL

## 2016-07-19 MED ORDER — ONDANSETRON HCL 4 MG/2ML IJ SOLN
4.0000 mg | INTRAMUSCULAR | Status: DC | PRN
Start: 2016-07-19 — End: 2016-07-19

## 2016-07-19 MED ORDER — OXYTOCIN 40 UNITS IN LACTATED RINGERS INFUSION - SIMPLE MED: 2.5000 [IU]/h | INTRAVENOUS | Status: DC

## 2016-07-19 MED ORDER — WITCH HAZEL-GLYCERIN EX PADS: 1.0000 "application " | MEDICATED_PAD | CUTANEOUS | Status: DC | PRN

## 2016-07-19 MED ORDER — NALOXONE HCL 2 MG/2ML IJ SOSY
1.0000 ug/kg/h | PREFILLED_SYRINGE | INTRAVENOUS | Status: DC | PRN
  Filled 2016-07-19: qty 2

## 2016-07-19 MED ORDER — OXYTOCIN 40 UNITS IN LACTATED RINGERS INFUSION - SIMPLE MED
INTRAVENOUS | Status: AC
  Filled 2016-07-19: qty 1000

## 2016-07-19 MED ORDER — ONDANSETRON HCL 4 MG/2ML IJ SOLN: 4.0000 mg | Freq: Three times a day (TID) | INTRAMUSCULAR | Status: DC | PRN

## 2016-07-19 MED ORDER — SODIUM CHLORIDE 0.9 % IV SOLN
2.0000 g | Freq: Once | INTRAVENOUS | Status: AC
  Administered 2016-07-19: 2 g via INTRAVENOUS
  Filled 2016-07-19: qty 2000

## 2016-07-19 MED ORDER — ZOLPIDEM TARTRATE 5 MG PO TABS
5.0000 mg | ORAL_TABLET | Freq: Every evening | ORAL | Status: DC | PRN
  Administered 2016-07-20 (×2): 5 mg via ORAL
  Filled 2016-07-19 (×2): qty 1

## 2016-07-19 MED ORDER — TERBUTALINE SULFATE 1 MG/ML IJ SOLN
0.2500 mg | Freq: Once | INTRAMUSCULAR | Status: DC | PRN
Start: 2016-07-19 — End: 2016-07-19

## 2016-07-19 MED ORDER — ACETAMINOPHEN 325 MG PO TABS
650.0000 mg | ORAL_TABLET | ORAL | Status: DC | PRN
  Administered 2016-07-20 – 2016-07-21 (×5): 650 mg via ORAL
  Filled 2016-07-19 (×5): qty 2

## 2016-07-19 MED ORDER — LIDOCAINE HCL (PF) 1 % IJ SOLN
INTRAMUSCULAR | Status: DC | PRN
  Administered 2016-07-19: 3 mL via SUBCUTANEOUS

## 2016-07-19 MED ORDER — ONDANSETRON HCL 4 MG PO TABS: 4.0000 mg | ORAL_TABLET | ORAL | Status: DC | PRN

## 2016-07-19 MED ORDER — ROPIVACAINE HCL 2 MG/ML IJ SOLN
10.0000 mL/h | INTRAMUSCULAR | Status: DC
  Filled 2016-07-19 (×2): qty 5

## 2016-07-19 MED ORDER — SIMETHICONE 80 MG PO CHEW: 80.0000 mg | CHEWABLE_TABLET | ORAL | Status: DC | PRN

## 2016-07-19 MED ORDER — OXYTOCIN BOLUS FROM INFUSION: 500.0000 mL | Freq: Once | INTRAVENOUS | Status: DC

## 2016-07-19 MED ORDER — LIDOCAINE HCL (PF) 1 % IJ SOLN: 30.0000 mL | INTRAMUSCULAR | Status: DC | PRN

## 2016-07-19 MED ORDER — MEPERIDINE HCL 25 MG/ML IJ SOLN: 6.2500 mg | INTRAMUSCULAR | Status: DC | PRN

## 2016-07-19 MED ORDER — BISACODYL 10 MG RE SUPP: 10.0000 mg | Freq: Every day | RECTAL | Status: DC | PRN

## 2016-07-19 MED ORDER — SENNOSIDES-DOCUSATE SODIUM 8.6-50 MG PO TABS
2.0000 | ORAL_TABLET | ORAL | Status: DC
  Administered 2016-07-20: 2 via ORAL
  Filled 2016-07-19: qty 2

## 2016-07-19 MED ORDER — DIPHENHYDRAMINE HCL 25 MG PO CAPS
25.0000 mg | ORAL_CAPSULE | Freq: Four times a day (QID) | ORAL | Status: DC | PRN
  Administered 2016-07-20: 25 mg via ORAL
  Filled 2016-07-19: qty 1

## 2016-07-19 MED ORDER — OXYTOCIN 40 UNITS IN LACTATED RINGERS INFUSION - SIMPLE MED
1.0000 m[IU]/min | INTRAVENOUS | Status: DC
  Administered 2016-07-19: 1 m[IU]/min via INTRAVENOUS

## 2016-07-19 MED ORDER — SODIUM CHLORIDE 0.9% FLUSH: 3.0000 mL | Freq: Two times a day (BID) | INTRAVENOUS | Status: DC

## 2016-07-19 MED ORDER — KETOROLAC TROMETHAMINE 30 MG/ML IJ SOLN: 30.0000 mg | Freq: Four times a day (QID) | INTRAMUSCULAR | Status: DC | PRN

## 2016-07-19 MED ORDER — DIBUCAINE 1 % RE OINT: 1.0000 "application " | TOPICAL_OINTMENT | RECTAL | Status: DC | PRN

## 2016-07-19 MED ORDER — DIPHENHYDRAMINE HCL 50 MG/ML IJ SOLN: 12.5000 mg | INTRAMUSCULAR | Status: DC | PRN

## 2016-07-19 MED ORDER — BENZOCAINE-MENTHOL 20-0.5 % EX AERO
1.0000 "application " | INHALATION_SPRAY | CUTANEOUS | Status: DC | PRN
  Filled 2016-07-19: qty 56

## 2016-07-19 MED ORDER — AMMONIA AROMATIC IN INHA
RESPIRATORY_TRACT | Status: AC
  Filled 2016-07-19: qty 10

## 2016-07-19 MED ORDER — FLEET ENEMA 7-19 GM/118ML RE ENEM: 1.0000 | ENEMA | Freq: Every day | RECTAL | Status: DC | PRN

## 2016-07-19 MED ORDER — IBUPROFEN 600 MG PO TABS
600.0000 mg | ORAL_TABLET | Freq: Four times a day (QID) | ORAL | Status: DC | PRN
  Administered 2016-07-20: 600 mg via ORAL
  Filled 2016-07-19 (×3): qty 1

## 2016-07-19 MED ORDER — BUPIVACAINE HCL (PF) 0.5 % IJ SOLN
INTRAMUSCULAR | Status: AC
  Filled 2016-07-19: qty 30

## 2016-07-19 MED ORDER — SENNOSIDES-DOCUSATE SODIUM 8.6-50 MG PO TABS: 2.0000 | ORAL_TABLET | ORAL | Status: DC

## 2016-07-19 MED ORDER — SODIUM CHLORIDE 0.9 % IV SOLN
INTRAVENOUS | Status: DC | PRN
  Administered 2016-07-19 (×2): 4 mL via EPIDURAL

## 2016-07-19 MED ORDER — LACTATED RINGERS IV SOLN
INTRAVENOUS | Status: DC
  Administered 2016-07-19 (×2): via INTRAVENOUS

## 2016-07-19 MED ORDER — IBUPROFEN 600 MG PO TABS
600.0000 mg | ORAL_TABLET | Freq: Four times a day (QID) | ORAL | Status: DC
  Administered 2016-07-19: 600 mg via ORAL

## 2016-07-19 MED ORDER — OXYTOCIN 10 UNIT/ML IJ SOLN
INTRAMUSCULAR | Status: AC
  Filled 2016-07-19: qty 2

## 2016-07-19 NOTE — Progress Notes (Signed)
[name] Young is a 31 y.o. [id number] at 3821w0d  admitted for IOL at term.   Subjective: "I will want an epidural"  Objective: BP 93/65 (BP Location: Left Arm)   Pulse 81   Temp 98.4 F (36.9 C) (Oral)   Resp 16   Ht 5' (1.524 m)   Wt 70.8 kg (156 lb)   LMP 10/13/2015   BMI 30.47 kg/m  No intake/output data recorded. No intake/output data recorded.  FHT:  155, 1 decel during AROM, Cat 1 otherwise UC:  occas UC seen  SVE:   Dilation: 4-5/80%/vtx-2  Labs: Lab Results  Component Value Date   WBC 7.9 07/19/2016   HGB 9.6 (L) 07/19/2016   HCT 28.0 (L) 07/19/2016   MCV 84.3 07/19/2016   PLT 206 07/19/2016    Assessment / Plan: A: IUP at term 2. GBS pos P:1. Start Pitocin per protocol 2. Continue antibiotics as ordered. 3. Continue to monitor UC/FHT's. Dr Dalbert GarnetBeasley was given report and aware and agrees with the plan.    [name] Young 07/19/2016, 12:09 PM

## 2016-07-19 NOTE — Anesthesia Procedure Notes (Signed)
Epidural Patient location during procedure: OB Start time: 07/19/2016 4:20 PM End time: 07/19/2016 4:35 PM  Staffing Anesthesiologist: Lenard SimmerKARENZ, Labrea Eccleston Performed: anesthesiologist   Preanesthetic Checklist Completed: patient identified, site marked, surgical consent, pre-op evaluation, timeout performed, IV checked, risks and benefits discussed and monitors and equipment checked  Epidural Patient position: sitting Prep: ChloraPrep Patient monitoring: heart rate, continuous pulse ox and blood pressure Approach: midline Location: L4-L5 Injection technique: LOR saline  Needle:  Needle type: Tuohy  Needle gauge: 17 G Needle length: 9 cm and 9 Needle insertion depth: 6 cm Catheter type: closed end flexible Catheter size: 19 Gauge Catheter at skin depth: 11 cm Test dose: negative and 1.5% lidocaine with Epi 1:200 K  Assessment Sensory level: T10 Events: blood not aspirated, injection not painful, no injection resistance, negative IV test and no paresthesia  Additional Notes Pt. Evaluated and documentation done after procedure finished. Patient identified. Risks/Benefits/Options discussed with patient including but not limited to bleeding, infection, nerve damage, paralysis, failed block, incomplete pain control, headache, blood pressure changes, nausea, vomiting, reactions to medication both or allergic, itching and postpartum back pain. Confirmed with bedside nurse the patient's most recent platelet count. Confirmed with patient that they are not currently taking any anticoagulation, have any bleeding history or any family history of bleeding disorders. Patient expressed understanding and wished to proceed. All questions were answered. Sterile technique was used throughout the entire procedure. Please see nursing notes for vital signs. Test dose was given through epidural catheter and negative prior to continuing to dose epidural or start infusion. Warning signs of high block given to the  patient including shortness of breath, tingling/numbness in hands, complete motor block, or any concerning symptoms with instructions to call for help. Patient was given instructions on fall risk and not to get out of bed. All questions and concerns addressed with instructions to call with any issues or inadequate analgesia.   Patient tolerated the insertion well without immediate complications.Reason for block:procedure for pain
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
## 2016-07-19 NOTE — Discharge Summary (Signed)
Obstetric Discharge Summary Reason for Admission: IOL per pt preference at term Prenatal Procedures: US Intrapartum Procedures: AROM, Ext fetal and uterine monitors Postpartum Procedures: None Complications-Operative and Postpartum:None Hemoglobin  Date Value Ref Range Status  07/19/2016 9.6 (L) 12.0 - 16.0 g/dL Final   HGB  Date Value Ref Range Status  05/18/2014 10.0 (L) 12.0 - 16.0 g/dL Final   HCT  Date Value Ref Range Status  07/19/2016 28.0 (L) 35.0 - 47.0 % Final  05/18/2014 29.9 (L) 35.0 - 47.0 % Final    Physical Exam:  General: A,A&O x 3 Lochia:mod,no clots Uterine Fundus:FF and lochia mod Incision: None DVT Evaluation: Neg Homans  Discharge Diagnoses: Term delivery of viable female infant  Discharge Information: Date: 07/19/2016 Activity:Pelvic rest x 6 weeks Diet:regular  Medications:PNV, Ibuprofen Condition: stable  Instructions:No driving x 6 weeks Discharge to: Home    Newborn Data: Live born unspecified sex  Birth Weight:  7#15oz APGAR: 8, 9  Home with baby   Sharee PimpleCaron W Lema Heinkel 07/19/2016, 3:55 PM

## 2016-07-19 NOTE — Discharge Instructions (Signed)
Care After Vaginal Delivery °Congratulations on your new baby!! ° °Refer to this sheet in the next few weeks. These discharge instructions provide you with information on caring for yourself after delivery. Your caregiver may also give you specific instructions. Your treatment has been planned according to the most current medical practices available, but problems sometimes occur. Call your caregiver if you have any problems or questions after you go home. ° °HOME CARE INSTRUCTIONS °· Take over-the-counter or prescription medicines only as directed by your caregiver or pharmacist. °· Do not drink alcohol, especially if you are breastfeeding or taking medicine to relieve pain. °· Do not chew or smoke tobacco. °· Do not use illegal drugs. °· Continue to use good perineal care. Good perineal care includes: °¨ Wiping your perineum from front to back. °¨ Keeping your perineum clean. °· Do not use tampons or douche until your caregiver says it is okay. °· Shower, wash your hair, and take tub baths as directed by your caregiver. °· Wear a well-fitting bra that provides breast support. °· Eat healthy foods. °· Drink enough fluids to keep your urine clear or pale yellow. °· Eat high-fiber foods such as whole grain cereals and breads, brown rice, beans, and fresh fruits and vegetables every day. These foods may help prevent or relieve constipation. °· Follow your caregiver's recommendations regarding resumption of activities such as climbing stairs, driving, lifting, exercising, or traveling. Specifically, no driving for two weeks, so that you are comfortable reacting quickly in an emergency. °· Talk to your caregiver about resuming sexual activities. Resumption of sexual activities is dependent upon your risk of infection, your rate of healing, and your comfort and desire to resume sexual activity. Usually we recommend waiting about six weeks, or until your bleeding stops and you are interested in sex. °· Try to have someone  help you with your household activities and your newborn for at least a few days after you leave the hospital. Even longer is better. °· Rest as much as possible. Try to rest or take a nap when your newborn is sleeping. Sleep deprivation can be very hard after delivery. °· Increase your activities gradually. °· Keep all of your scheduled postpartum appointments. It is very important to keep your scheduled follow-up appointments. At these appointments, your caregiver will be checking to make sure that you are healing physically and emotionally. ° °SEEK MEDICAL CARE IF:  °· You are passing large clots from your vagina.  °· You have a foul smelling discharge from your vagina. °· You have trouble urinating. °· You are urinating frequently. °· You have pain when you urinate. °· You have a change in your bowel movements. °· You have increasing redness, pain, or swelling near your vaginal incision (episiotomy) or vaginal tear. °· You have pus draining from your episiotomy or vaginal tear. °· Your episiotomy or vaginal tear is separating. °· You have painful, hard, or reddened breasts. °· You have a severe headache. °· You have blurred vision or see spots. °· You feel sad or depressed. °· You have thoughts of hurting yourself or your newborn. °· You have questions about your care, the care of your newborn, or medicines. °· You are dizzy or light-headed. °· You have a rash. °· You have nausea or vomiting. °· You were breastfeeding and have not had a menstrual period within 12 weeks after you stopped breastfeeding. °· You are not breastfeeding and have not had a menstrual period by the 12th week after delivery. °· You   have a fever. ° °SEEK IMMEDIATE MEDICAL CARE IF:  °· You have persistent pain. °· You have chest pain. °· You have shortness of breath. °· You faint. °· You have leg pain. °· You have stomach pain. °· Your vaginal bleeding saturates two or more sanitary pads in 1 hour. ° °MAKE SURE YOU:  °· Understand these  instructions. °· Will get help right away if you are not doing well or get worse. °·  °Document Released: 04/18/2000 Document Revised: 09/05/2013 Document Reviewed: 12/17/2011 ° °Exit[employer]® Patient Information ©2015 [employer]. This information is not intended to replace advice given to you by your health care provider. Make sure you discuss any questions you have with your health care provider. ° °

## 2016-07-19 NOTE — Progress Notes (Addendum)
Merrie RoofBrittney S Rodino is a 31 y.o. [id number] at 6094w0d for IOL  Subjective: I am comfortable  Objective: BP 95/61   Pulse 65   Temp 97.6 F (36.4 C) (Axillary)   Resp 16   Ht 5' (1.524 m)   Wt 70.8 kg (156 lb)   LMP 10/13/2015   SpO2 98%   BMI 30.47 kg/m  No intake/output data recorded. No intake/output data recorded.  FHT:  130, Cat 1, no decels UC:   SVE:   Dilation: 4.5 Effacement (%): 90 Station: -2, -3 Exam by:: Milon Scorearon Johndaniel Catlin, CNM  Labs: Lab Results  Component Value Date   WBC 7.9 07/19/2016   HGB 9.6 (L) 07/19/2016   HCT 28.0 (L) 07/19/2016   MCV 84.3 07/19/2016   PLT 206 07/19/2016    Assessment / Plan: A: IUP at term for IOL 2. GBS pos P; 1. Antic NSVD 2. IUPC with Pitocin per protocol    Sharee Pimplearon W Theador Jezewski 07/19/2016, 5:17 PM

## 2016-07-19 NOTE — Progress Notes (Signed)
[name] Young is a 31 y.o. [id number] at 5949w0d for IOL.  Subjective: I want an epidural   Objective: BP 107/68 (BP Location: Left Arm)   Pulse 73   Temp 98.1 F (36.7 C) (Oral)   Resp 16   Ht 5' (1.524 m)   Wt 70.8 kg (156 lb)   LMP 10/13/2015   BMI 30.47 kg/m  No intake/output data recorded. No intake/output data recorded.  FHT:140, +accels, no decels, Cat 1, mod variabil UC:  q 2.5 to 3 mins SVE:   Dilation: 4.5 Station: -2, -3 Exam by:: Beatriz Stallion. [name] Young, CNM  Labs: Lab Results  Component Value Date   WBC 7.9 07/19/2016   HGB 9.6 (L) 07/19/2016   HCT 28.0 (L) 07/19/2016   MCV 84.3 07/19/2016   PLT 206 07/19/2016    Assessment / Plan: A: IUP at term for IOL for pt preference 2. GBS pos P: Epidural 2. Continue antibiotics as ordered.  3. Pitocin per protocol 4. Continue to monitor UC/FHT    Sharee Pimplearon W Jontay Maston 07/19/2016, 3:52 PM

## 2016-07-19 NOTE — Anesthesia Preprocedure Evaluation (Signed)
Anesthesia Evaluation  Patient identified by MRN, date of birth, ID band Patient awake    Reviewed: Allergy & Precautions, H&P , NPO status   History of Anesthesia Complications Negative for: history of anesthetic complications  Airway Mallampati: II       Dental  (+) Teeth Intact   Pulmonary neg recent URI, Current Smoker,           Cardiovascular Exercise Tolerance: Good (-) hypertension(-) angina(-) CAD, (-) Past MI, (-) Cardiac Stents and (-) CABG + dysrhythmias (1 episode, treated with adenosine in the ER) Supra Ventricular Tachycardia (-) Valvular Problems/Murmurs     Neuro/Psych PSYCHIATRIC DISORDERS (Bipolar) negative neurological ROS     GI/Hepatic negative GI ROS, Neg liver ROS,   Endo/Other  negative endocrine ROS  Renal/GU negative Renal ROS  negative genitourinary   Musculoskeletal   Abdominal   Peds  Hematology  (+) Blood dyscrasia, anemia ,   Anesthesia Other Findings Past Medical History: No date: Abnormal Pap smear of cervix No date: Anemia No date: Anxiety 2012: Auditory hallucination No date: Pica in adults 2012: Severe major depression with psychotic feature* No date: UTI (urinary tract infection)   Reproductive/Obstetrics (+) Pregnancy                             Anesthesia Physical  Anesthesia Plan  ASA: II  Anesthesia Plan: Epidural   Post-op Pain Management:    Induction:   Airway Management Planned:   Additional Equipment:   Intra-op Plan:   Post-operative Plan:   Informed Consent: I have reviewed the patients History and Physical, chart, labs and discussed the procedure including the risks, benefits and alternatives for the proposed anesthesia with the patient or authorized representative who has indicated his/her understanding and acceptance.     Plan Discussed with: Anesthesiologist  Anesthesia Plan Comments:         Anesthesia  Quick Evaluation

## 2016-07-20 LAB — CBC
HEMATOCRIT: 24.8 % — AB (ref 35.0–47.0)
HEMOGLOBIN: 8.3 g/dL — AB (ref 12.0–16.0)
MCH: 27.6 pg (ref 26.0–34.0)
MCHC: 33.4 g/dL (ref 32.0–36.0)
MCV: 82.5 fL (ref 80.0–100.0)
Platelets: 182 10*3/uL (ref 150–440)
RBC: 3 MIL/uL — AB (ref 3.80–5.20)
RDW: 13.9 % (ref 11.5–14.5)
WBC: 11.9 10*3/uL — ABNORMAL HIGH (ref 3.6–11.0)

## 2016-07-20 LAB — RPR: RPR: NONREACTIVE

## 2016-07-20 MED ORDER — TRAMADOL HCL 50 MG PO TABS
50.0000 mg | ORAL_TABLET | Freq: Four times a day (QID) | ORAL | Status: DC
  Administered 2016-07-20 – 2016-07-21 (×4): 50 mg via ORAL
  Filled 2016-07-20 (×4): qty 1

## 2016-07-20 MED ORDER — DOCUSATE SODIUM 100 MG PO CAPS
100.0000 mg | ORAL_CAPSULE | Freq: Two times a day (BID) | ORAL | Status: DC
  Administered 2016-07-20 – 2016-07-21 (×3): 100 mg via ORAL
  Filled 2016-07-20 (×3): qty 1

## 2016-07-20 MED ORDER — HYDROCODONE-ACETAMINOPHEN 5-325 MG PO TABS
1.0000 | ORAL_TABLET | Freq: Four times a day (QID) | ORAL | Status: DC | PRN
  Administered 2016-07-20 – 2016-07-21 (×5): 1 via ORAL
  Filled 2016-07-20 (×5): qty 1

## 2016-07-20 MED ORDER — NICOTINE 7 MG/24HR TD PT24
7.0000 mg | MEDICATED_PATCH | Freq: Every day | TRANSDERMAL | Status: DC
  Administered 2016-07-20 – 2016-07-21 (×2): 7 mg via TRANSDERMAL
  Filled 2016-07-20 (×3): qty 1

## 2016-07-20 MED ORDER — FERROUS SULFATE 325 (65 FE) MG PO TABS
325.0000 mg | ORAL_TABLET | Freq: Three times a day (TID) | ORAL | Status: DC
  Administered 2016-07-20 – 2016-07-21 (×3): 325 mg via ORAL
  Filled 2016-07-20 (×3): qty 1

## 2016-07-20 NOTE — Progress Notes (Signed)
Pt. Stated that her 152 yr old son was drinking the formula left in the bottles that the infant did not finish.  Pt. Instructed not to do this and to discard any unused formula into the trash can after the infant is finished with the bottle.  Pt. Verbalizes understanding.

## 2016-07-20 NOTE — Anesthesia Postprocedure Evaluation (Signed)
Anesthesia Post Note  Patient: [name] Young  Procedure(s) Performed: * No procedures listed *  Patient location during evaluation: Mother Baby Anesthesia Type: Epidural Level of consciousness: awake and alert Pain management: pain level controlled Vital Signs Assessment: post-procedure vital signs reviewed and stable Respiratory status: spontaneous breathing, nonlabored ventilation and respiratory function stable Cardiovascular status: stable Postop Assessment: no headache, no backache, epidural receding and patient able to bend at knees Anesthetic complications: no     Last Vitals:  Vitals:   07/20/16 0338 07/20/16 0811  BP: (!) 95/52 (!) 102/52  Pulse: (!) 59 (!) 51  Resp: 18 17  Temp: 36.8 C 36.8 C    Last Pain:  Vitals:   07/20/16 0811  TempSrc: Oral  PainSc:                  [name]

## 2016-07-20 NOTE — Progress Notes (Signed)
Patient upset that husband cannot get free guest tray for lunch. Patient guest had complimentary tray for breakfast. Patient is under the assumption that every meal, guest gets a free tray. Dining services has spoken to the patient and explained that there is only 1 free complimentary guest tray given. Patient requesting to speak to a social worker about getting more free guest trays. RN called weekend social worker, Ree KidaJack. SW stated that he is unable to fix this issue. Dining services supervisor called and stated that free trays can be sent to patient room and charged to the unit if patient husband cannot afford to buy food, as long as that is ok with us. RN did not ok this plan. RN to speak with shift coordinator about this issue before complimentary guest tray sent to patient room.

## 2016-07-20 NOTE — Progress Notes (Signed)
RN spoke with shift coordinator at 1330 about allowing complimentary guest tray being charged to the unit. Patient given option of having one other complimentary guest tray at that time. RN spoke with patient and patient stated "nevermind" and that she was "ok."

## 2016-07-20 NOTE — Progress Notes (Signed)
Post Partum Day 1 Subjective: "I am cramping and the meds are not helping"  Objective: Blood pressure (!) 102/52, pulse (!) 51, temperature 98.3 F (36.8 C), temperature source Oral, resp. rate 17, height 5' (1.524 m), weight 70.8 kg (156 lb), last menstrual period 10/13/2015, SpO2 99 %, unknown if currently breastfeeding.  Physical Exam:  General:A,A&O x3 Herat: S1S2, RRR, No M/R?G Lungs: CTA bilat, no W/R/R. Lochia:mod and no clots Uterine Fundus:FF  Incision: None DVT Evaluation: Neg Homans   Recent Labs  07/19/16 1037 07/20/16 0433  HGB 9.6* 8.3*  HCT 28.0* 24.8*    Assessment/Plan: A: 1.PPD#1n stable 2. Uterine cramping P: 1. Change meds to Tramadol 2. DC in am  LOS: 1 day   Sharee PimpleCaron W Jones 07/20/2016, 11:26 AM

## 2016-07-21 ENCOUNTER — Encounter: Payer: Self-pay | Admitting: Emergency Medicine

## 2016-07-21 DIAGNOSIS — F3172 Bipolar disorder, in full remission, most recent episode hypomanic: Secondary | ICD-10-CM

## 2016-07-21 DIAGNOSIS — F319 Bipolar disorder, unspecified: Secondary | ICD-10-CM

## 2016-07-21 MED ORDER — MEDROXYPROGESTERONE ACETATE 150 MG/ML IM SUSP
150.0000 mg | INTRAMUSCULAR | Status: DC
  Administered 2016-07-21: 150 mg via INTRAMUSCULAR
  Filled 2016-07-21: qty 1

## 2016-07-21 MED ORDER — BENZOCAINE-MENTHOL 20-0.5 % EX AERO: 1.0000 "application " | INHALATION_SPRAY | CUTANEOUS | 1 refills | Status: DC | PRN

## 2016-07-21 MED ORDER — HYDROCODONE-ACETAMINOPHEN 5-325 MG PO TABS: 1.0000 | ORAL_TABLET | Freq: Four times a day (QID) | ORAL | 0 refills | Status: DC | PRN

## 2016-07-21 MED ORDER — IBUPROFEN 800 MG PO TABS: 800.0000 mg | ORAL_TABLET | Freq: Three times a day (TID) | ORAL | 0 refills | Status: DC | PRN

## 2016-07-21 MED ORDER — MEDROXYPROGESTERONE ACETATE 150 MG/ML IM SUSP: 150.0000 mg | INTRAMUSCULAR | 4 refills | Status: DC

## 2016-07-21 NOTE — Progress Notes (Signed)
Dr. Toni Amendlapacs notified of patients discharge order from primary provider. Pt appears to be agitated. Requested Dr. Toni Amendlapacs to see patient as soon as possible. MD states will be up to assess patient as soon as he can.
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
## 2016-07-21 NOTE — Progress Notes (Signed)
Patient called out for respite care 5 minutes ago; nurse checked with transition nurse, Sam RN, and went in room to take infant for screening/respite; nurse observed patient holding infant in her arms while she was snoring/asleep (also has her 31 yo in bed with her with siderails up; husband asleep in recliner at bedside). Nurse took infant out of patient's arms and woke patient to remind her not to sleep while holding infant; nurse had already told patient not to sleep with infant and patient verbalized understanding of same X 4 since the beginning of the 7 pm shift.
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
## 2016-07-21 NOTE — Progress Notes (Signed)
Patient ID: [name] RoofBrittney S Bledsoe, female   DOB: 03/26/1986, 31 y.o.   MRN: [id number] Pt without any depression or hallucinations . Pt is followed by Dr Letta PateAycock . No psychiatric meds . Pt has no c/o . Will d/c home without psych consult . Precautions given to the pt

## 2016-07-21 NOTE — Progress Notes (Signed)
Dr. Feliberto GottronSchermerhorn called to clarify order for psych consult. MD states to continue with plan for psych consult before discharge.

## 2016-07-21 NOTE — Discharge Summary (Signed)
Obstetric Discharge Summary Reason for Admission: induction of labor Prenatal Procedures: none Intrapartum Procedures: spontaneous vaginal delivery Postpartum Procedures: none Complications-Operative and Postpartum: none Hemoglobin  Date Value Ref Range Status  07/20/2016 8.3 (L) 12.0 - 16.0 g/dL Final   HGB  Date Value Ref Range Status  05/18/2014 10.0 (L) 12.0 - 16.0 g/dL Final   HCT  Date Value Ref Range Status  07/20/2016 24.8 (L) 35.0 - 47.0 % Final  05/18/2014 29.9 (L) 35.0 - 47.0 % Final    Physical Exam:  General: alert Lochia: appropriate Uterine Fundus: firm Incision: n/a DVT Evaluation: No evidence of DVT seen on physical exam. No results found for this or any previous visit (from the past 24 hour(s)). Discharge Diagnoses: Term Pregnancy-delivered  Discharge Information: Date: 07/21/2016 Activity: pelvic rest Diet: routine Medications: Ibuprofen, Vicodin and dermaplast , depo provera  Condition: stable Instructions: refer to practice specific booklet Discharge to: home    Depo provera given 150 mg IM before d/c   Follow-up Information    [facility] week([facility]).   Specialty:  General Practice Why:  pp care Contact information: [address] [place] [address] [phone]           Newborn Data: Live born female  Birth Weight: 7 lb 11.1 oz (3490 g) APGAR: 8, 9  Home with mother.  SCHERMERHORN,THOMAS 07/21/2016, 10:34 AM

## 2016-07-21 NOTE — Progress Notes (Signed)
Reviewed all patients discharge instructions and handouts regarding postpartum bleeding, no intercourse for 6 weeks, signs and symptoms of mastitis and postpartum bleu's. Reviewed discharge instructions for newborn regarding proper cord care, how and when to bathe the newborn, nail care, proper way to take the baby's temperature, along with safe sleep. All questions have been answered at this time. Patient discharged via wheelchair with axillary.  

## 2016-11-13 ENCOUNTER — Encounter: Payer: Self-pay | Admitting: Emergency Medicine

## 2016-11-13 ENCOUNTER — Emergency Department
Admission: EM | Admit: 2016-11-13 | Discharge: 2016-11-13 | Disposition: A | Discharge status: Home / Self Care | Payer: Medicaid Other | Attending: Student in an Organized Health Care Education/Training Program | Admitting: Student in an Organized Health Care Education/Training Program

## 2016-11-13 ENCOUNTER — Emergency Department: Payer: Self-pay

## 2016-11-13 ENCOUNTER — Emergency Department
Admission: EM | Admit: 2016-11-13 | Discharge: 2016-11-13 | Disposition: A | Discharge status: Home / Self Care | Payer: Self-pay | Attending: Emergency Medicine | Admitting: Emergency Medicine

## 2016-11-13 DIAGNOSIS — S92345A Nondisplaced fracture of fourth metatarsal bone, left foot, initial encounter for closed fracture: Secondary | ICD-10-CM | POA: Insufficient documentation

## 2016-11-13 DIAGNOSIS — Y929 Unspecified place or not applicable: Secondary | ICD-10-CM | POA: Insufficient documentation

## 2016-11-13 DIAGNOSIS — W010XXA Fall on same level from slipping, tripping and stumbling without subsequent striking against object, initial encounter: Secondary | ICD-10-CM | POA: Insufficient documentation

## 2016-11-13 DIAGNOSIS — Y939 Activity, unspecified: Secondary | ICD-10-CM | POA: Insufficient documentation

## 2016-11-13 DIAGNOSIS — Y9301 Activity, walking, marching and hiking: Secondary | ICD-10-CM | POA: Insufficient documentation

## 2016-11-13 DIAGNOSIS — F17210 Nicotine dependence, cigarettes, uncomplicated: Secondary | ICD-10-CM | POA: Insufficient documentation

## 2016-11-13 DIAGNOSIS — S9306XA Dislocation of unspecified ankle joint, initial encounter: Secondary | ICD-10-CM | POA: Insufficient documentation

## 2016-11-13 DIAGNOSIS — Y998 Other external cause status: Secondary | ICD-10-CM | POA: Insufficient documentation

## 2016-11-13 DIAGNOSIS — Z79899 Other long term (current) drug therapy: Secondary | ICD-10-CM | POA: Insufficient documentation

## 2016-11-13 DIAGNOSIS — F319 Bipolar disorder, unspecified: Secondary | ICD-10-CM | POA: Insufficient documentation

## 2016-11-13 DIAGNOSIS — S92902A Unspecified fracture of left foot, initial encounter for closed fracture: Secondary | ICD-10-CM

## 2016-11-13 DIAGNOSIS — R52 Pain, unspecified: Secondary | ICD-10-CM

## 2016-11-13 DIAGNOSIS — S93325A Dislocation of tarsometatarsal joint of left foot, initial encounter: Secondary | ICD-10-CM

## 2016-11-13 MED ORDER — HYDROMORPHONE HCL 1 MG/ML IJ SOLN
1.0000 mg | Freq: Once | INTRAMUSCULAR | Status: AC
  Administered 2016-11-13: 1 mg via INTRAMUSCULAR
  Filled 2016-11-13: qty 1

## 2016-11-13 MED ORDER — OXYCODONE-ACETAMINOPHEN 7.5-325 MG PO TABS
1.0000 | ORAL_TABLET | ORAL | 0 refills | Status: DC | PRN
Start: 2016-11-13 — End: 2018-01-13

## 2016-11-13 MED ORDER — MORPHINE SULFATE (PF) 4 MG/ML IV SOLN
8.0000 mg | INTRAVENOUS | Status: DC | PRN
  Administered 2016-11-13: 8 mg via INTRAMUSCULAR

## 2016-11-13 MED ORDER — IBUPROFEN 600 MG PO TABS: 600.0000 mg | ORAL_TABLET | Freq: Three times a day (TID) | ORAL | 0 refills | Status: DC | PRN

## 2016-11-13 MED ORDER — MORPHINE SULFATE (PF) 4 MG/ML IV SOLN
8.0000 mg | INTRAVENOUS | Status: DC | PRN
  Filled 2016-11-13: qty 2
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
## 2016-11-13 NOTE — ED Triage Notes (Signed)
Left sided foot pain. States she was seen in this ER today and dx with fracture of left foot. Pt took off splint that was applied today. Pt took percocet PTA. Pt left foot very swollen, tender to touch. Pulses present. Pt tearful.
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
## 2016-11-13 NOTE — Discharge Instructions (Signed)
Keep leg elevated.  Do not put pressure or weight on left leg.

## 2016-11-13 NOTE — Discharge Instructions (Signed)
Keep foot elevated when sitting or laying down. Wear splint and elevate with crutches. May remove splint for bathing.

## 2016-11-13 NOTE — ED Notes (Signed)
Ems pt to lobby via wheelchair, here earlier with dx of foot fx, having increased pain , removed splint due to tightness and tingling and turning colors. Per EMS pt walked down a flight of stairs to get to the EMS unit. Pt did not get RX filled.  No noted skin color change.  EMS reporting the smell of etoh

## 2016-11-13 NOTE — ED Triage Notes (Signed)
brought in via ems s/p fall this am.  States she tripped and twisted her right foot  Having some pain across top of foot with some swelling

## 2016-11-13 NOTE — ED Notes (Signed)
PT wheeled out to lobby with family. Pt leaving with husband. Calling a ride home.

## 2016-11-13 NOTE — ED Notes (Signed)
Splint applied by Dr. Roxan Hockeyobinson. Patient tolerated well. Patient given phone to call for a ride home. Patient will remain in room until ride is here.

## 2016-12-29 IMAGING — US US OB LIMITED
1 series · 14 of 28 positions shown · non-contrast
Comparison: 01/24/2016 pelvic ultrasound.

CLINICAL DATA: 30 y/o  F; abdominal pain since 4 p.m..

EXAM:
LIMITED OBSTETRIC ULTRASOUND

[Series 1: us ob limited · 0.22mm/px · 14 of 39 slices shown]
[im 2/39]
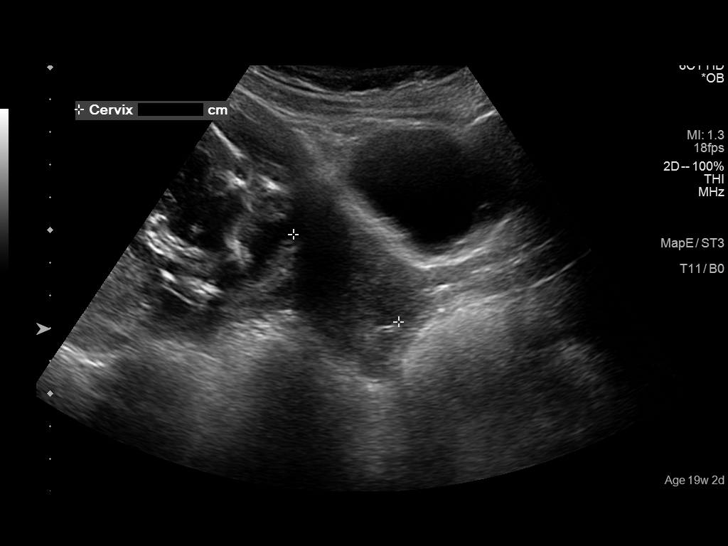
[im 5/39]
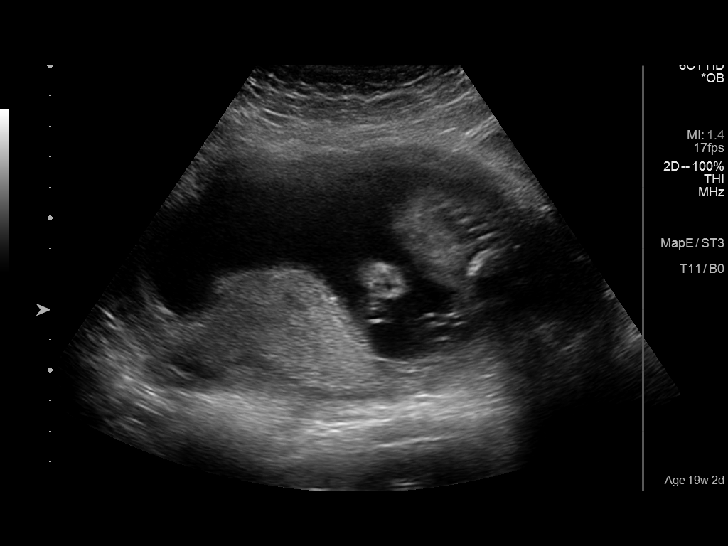
[im 8/39]
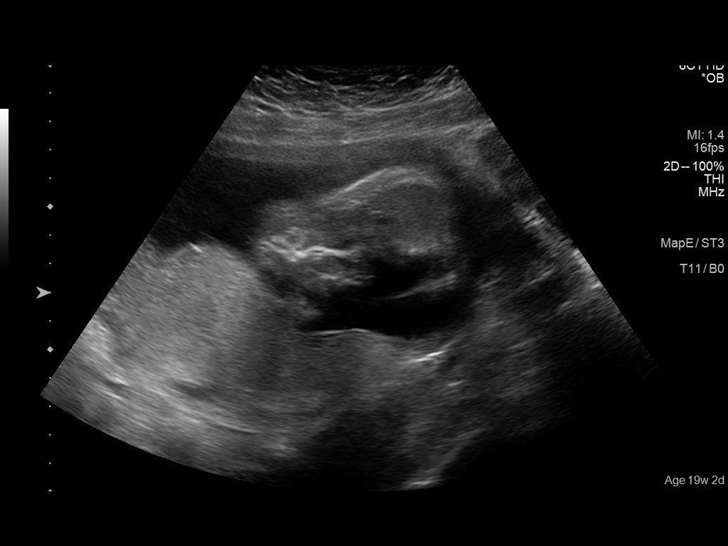
[im 10/39]
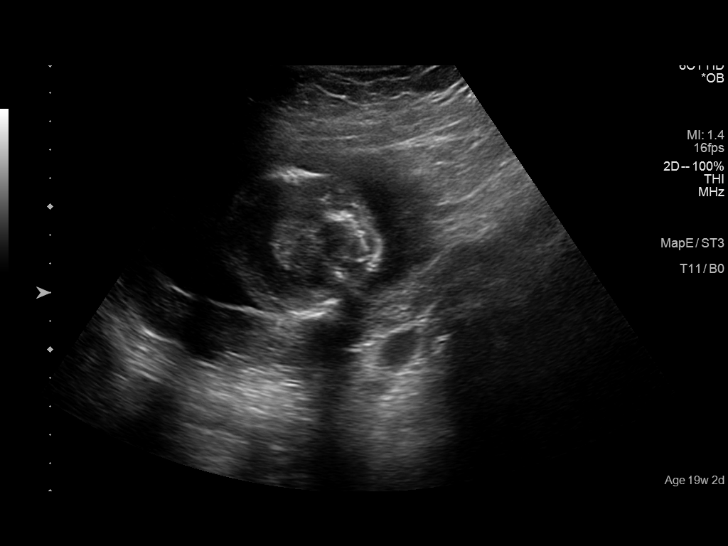
[im 13/39]
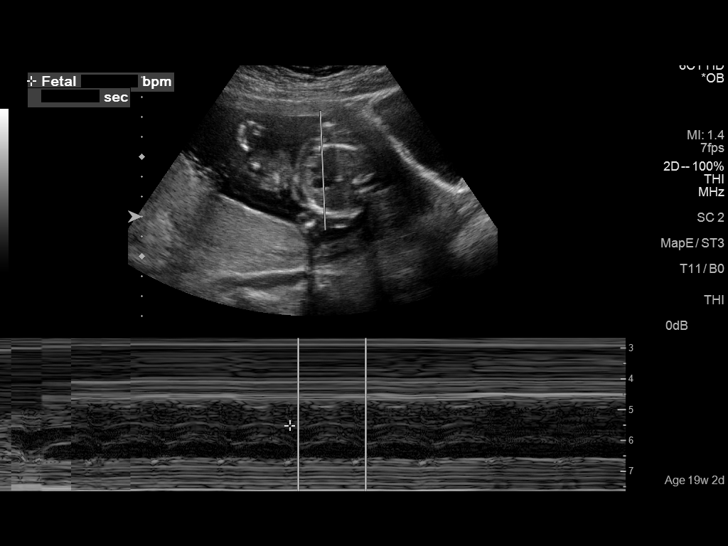
[im 16/39]
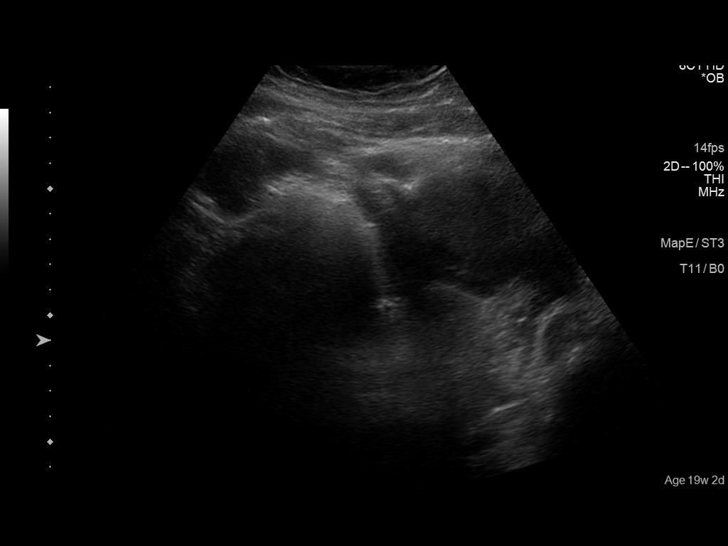
[im 19/39]
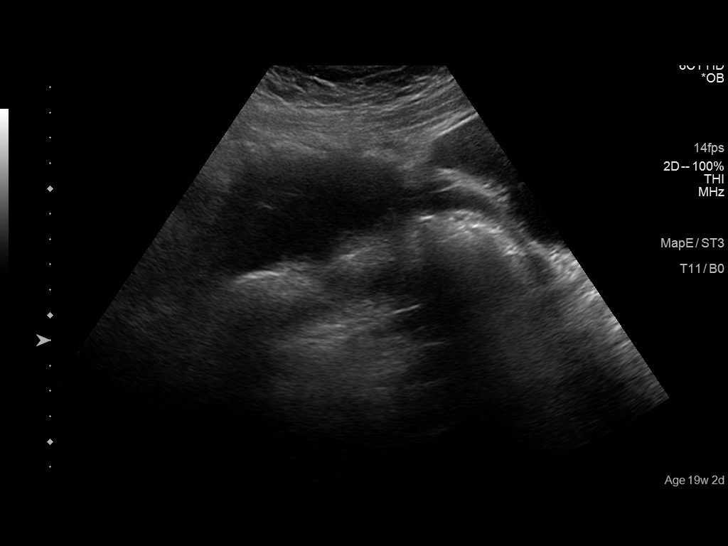
[im 22/39]
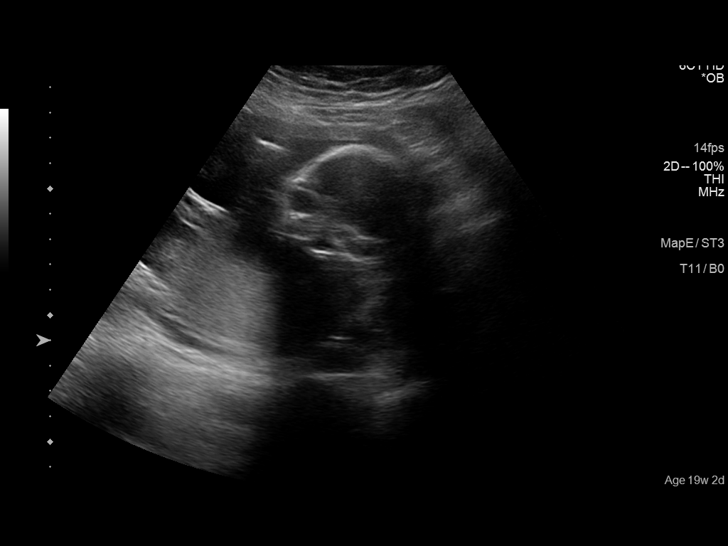
[im 24/39]
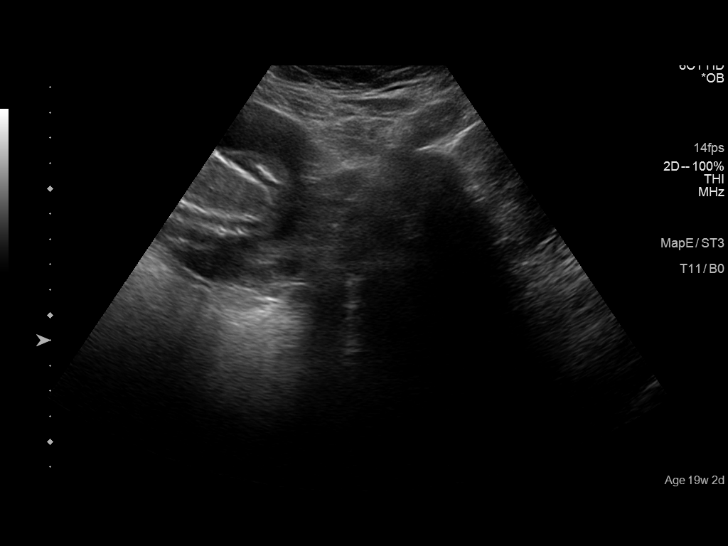
[im 27/39]
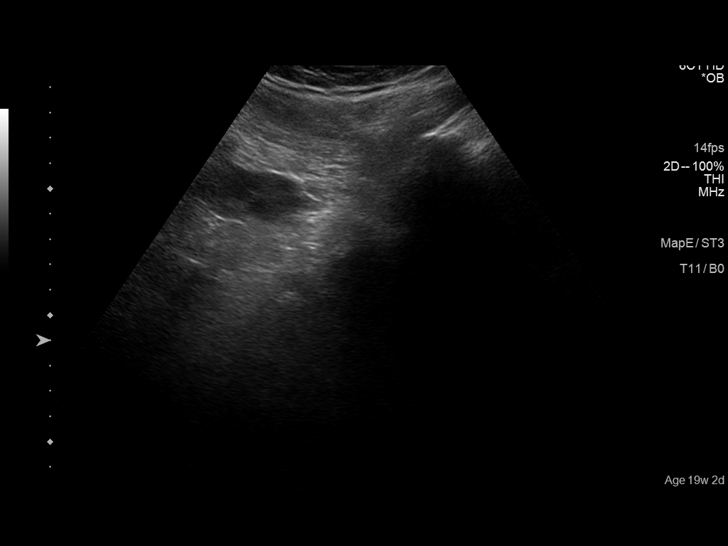
[im 30/39]
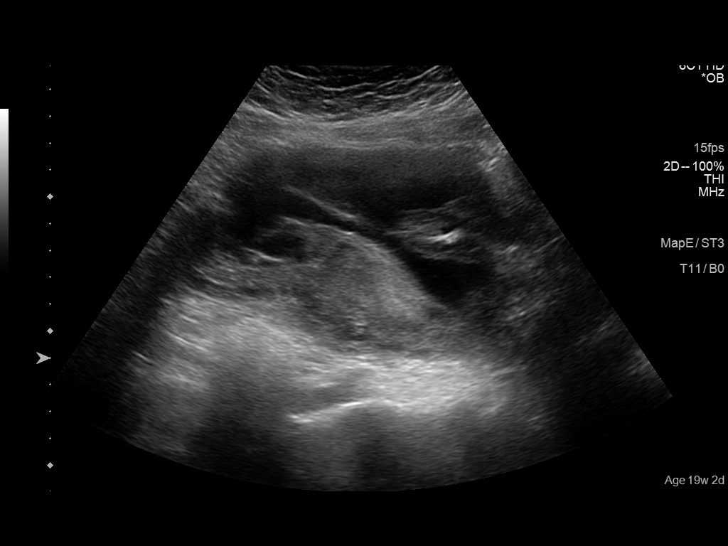
[im 33/39]
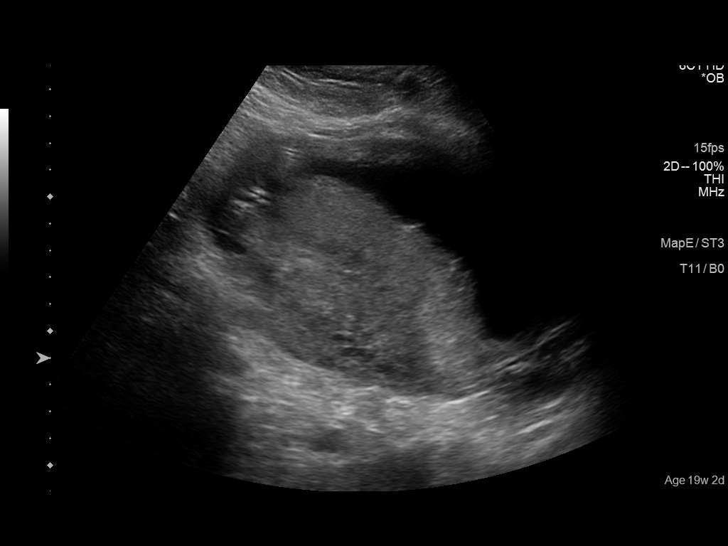
[im 36/39]
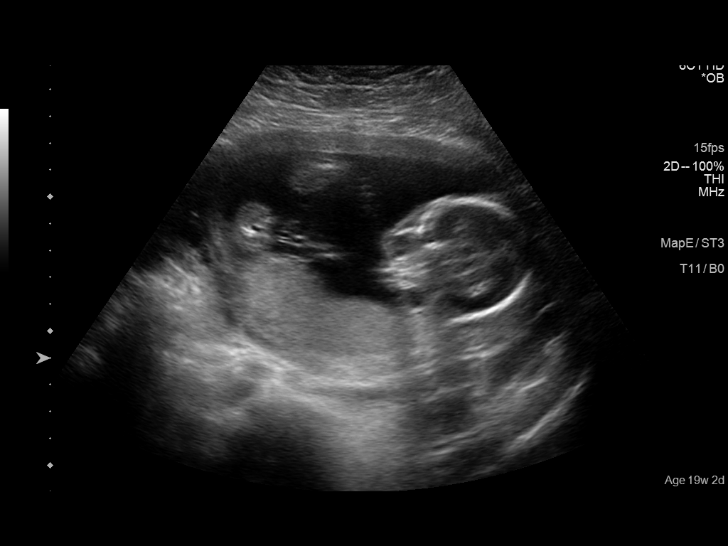
[im 39/39]
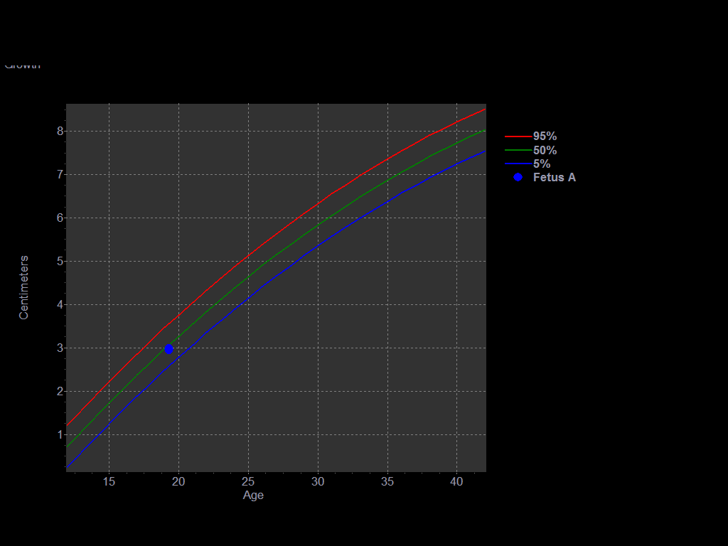

[14 of 28 positions shown; findings below may reference images not displayed]

FINDINGS: Number of Fetuses:  1

Heart Rate:  152 bpm

Movement:  Yes

Presentation: Breech

Placental Location: Posterior

Previa: No

Amniotic Fluid (Subjective):  Within normal limits.

Femur length 2.96 cm.  Estimated gestational age 19 weeks 1 day

MATERNAL FINDINGS:

Cervix:  Appears closed.

Uterus/Adnexae:  No abnormality visualized.
IMPRESSION: Single live intrauterine pregnancy. Calculated growth in comparison
to prior ultrasound is grossly within normal limits. No acute
process identified.

This exam is performed on an emergent basis and does not
comprehensively evaluate fetal size, dating, or anatomy; follow-up
complete OB US should be considered if further fetal assessment is
warranted.

By: Blain Jumper M.D.

## 2017-01-05 ENCOUNTER — Emergency Department: Payer: Self-pay

## 2017-01-05 ENCOUNTER — Emergency Department
Admission: EM | Admit: 2017-01-05 | Discharge: 2017-01-05 | Disposition: A | Discharge status: Home / Self Care | Payer: Self-pay | Attending: Emergency Medicine | Admitting: Emergency Medicine

## 2017-01-05 ENCOUNTER — Encounter: Payer: Self-pay | Admitting: Emergency Medicine

## 2017-01-05 DIAGNOSIS — M79672 Pain in left foot: Secondary | ICD-10-CM | POA: Insufficient documentation

## 2017-01-05 DIAGNOSIS — Z79899 Other long term (current) drug therapy: Secondary | ICD-10-CM | POA: Insufficient documentation

## 2017-01-05 DIAGNOSIS — F17210 Nicotine dependence, cigarettes, uncomplicated: Secondary | ICD-10-CM | POA: Insufficient documentation

## 2017-01-05 MED ORDER — NAPROXEN 500 MG PO TABS
500.0000 mg | ORAL_TABLET | Freq: Once | ORAL | Status: AC
  Administered 2017-01-05: 500 mg via ORAL
  Filled 2017-01-05: qty 1

## 2017-01-05 MED ORDER — TRAMADOL HCL 50 MG PO TABS: 50.0000 mg | ORAL_TABLET | Freq: Four times a day (QID) | ORAL | 0 refills | Status: AC | PRN

## 2017-01-05 MED ORDER — NAPROXEN 500 MG PO TABS: 500.0000 mg | ORAL_TABLET | Freq: Two times a day (BID) | ORAL | Status: DC

## 2017-01-05 MED ORDER — TRAMADOL HCL 50 MG PO TABS
50.0000 mg | ORAL_TABLET | Freq: Once | ORAL | Status: AC
  Administered 2017-01-05: 50 mg via ORAL
  Filled 2017-01-05: qty 1
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
## 2017-01-05 NOTE — ED Triage Notes (Signed)
Pt to ed with c/o left foot pain that started after she tripped over something 2 days ago.

## 2017-01-05 NOTE — ED Notes (Addendum)
See triage note  states she tripped over something about 2 weeks  ago  conts to have pain to left foot  Positive swelling

## 2017-01-05 NOTE — Discharge Instructions (Signed)
Left foot pain is secondary to reinjuring of a healing fracture. Advised with open shoe, take medication as directed, and follow with Francis DowseJoel PCP for continual care.

## 2017-05-05 NOTE — L&D Delivery Note (Signed)
Delivery Note  First Stage: Labor onset: 0200 Augmentation : AROM, pitocin Analgesia /Anesthesia intrapartum: epidural AROM at 0530  Second Stage: Complete dilation at 0722 Onset of pushing at 0725 FHR second stage: 146bpm  Delivery of a viable female on 01/12/2018 at 0730 by CNM delivery of fetal head in LOA position with restitution to LOT. No nuchal cord;  Anterior then posterior shoulders delivered easily with gentle downward traction. Baby placed on mom's abdomen due to short cord, and attended to by peds.  Cord double clamped after cessation of pulsation, cut by CNM Cord blood sample collected    Third Stage: PItocin started at delivery of infant for active 3rd stage mgmt, Placenta delivered spontaneously intact with 3VC @ 0734. Cytotec PR given prophylactically due to PPH risk.  Placenta disposition: routine disposal Uterine tone Firm / bleeding scant  No cervical, perineal or vaginal lacerations identified  Anesthesia for repair: n/a Est. Blood Loss (mL): 150  Complications: none  Mom to postpartum.  Baby to Couplet care / Skin to Skin.  Newborn: Birth Weight: pending  Apgar Scores: 8/9 Feeding planned: formula

## 2017-06-14 ENCOUNTER — Encounter: Payer: Self-pay | Admitting: Emergency Medicine

## 2017-06-14 ENCOUNTER — Emergency Department
Admission: EM | Admit: 2017-06-14 | Discharge: 2017-06-14 | Disposition: A | Discharge status: Home / Self Care | Payer: Medicaid Other | Attending: Emergency Medicine | Admitting: Emergency Medicine

## 2017-06-14 DIAGNOSIS — O99519 Diseases of the respiratory system complicating pregnancy, unspecified trimester: Secondary | ICD-10-CM | POA: Insufficient documentation

## 2017-06-14 DIAGNOSIS — Z79899 Other long term (current) drug therapy: Secondary | ICD-10-CM | POA: Diagnosis not present

## 2017-06-14 DIAGNOSIS — R05 Cough: Secondary | ICD-10-CM | POA: Insufficient documentation

## 2017-06-14 DIAGNOSIS — Z3A00 Weeks of gestation of pregnancy not specified: Secondary | ICD-10-CM | POA: Diagnosis not present

## 2017-06-14 DIAGNOSIS — F17210 Nicotine dependence, cigarettes, uncomplicated: Secondary | ICD-10-CM | POA: Diagnosis not present

## 2017-06-14 DIAGNOSIS — J3489 Other specified disorders of nose and nasal sinuses: Secondary | ICD-10-CM | POA: Diagnosis not present

## 2017-06-14 DIAGNOSIS — J069 Acute upper respiratory infection, unspecified: Secondary | ICD-10-CM | POA: Insufficient documentation

## 2017-06-14 DIAGNOSIS — Z209 Contact with and (suspected) exposure to unspecified communicable disease: Secondary | ICD-10-CM | POA: Insufficient documentation

## 2017-06-14 DIAGNOSIS — R509 Fever, unspecified: Secondary | ICD-10-CM | POA: Diagnosis present

## 2017-06-14 DIAGNOSIS — R0981 Nasal congestion: Secondary | ICD-10-CM | POA: Insufficient documentation

## 2017-06-14 LAB — INFLUENZA PANEL BY PCR (TYPE A & B)
INFLBPCR: NEGATIVE
Influenza A By PCR: NEGATIVE

## 2017-06-14 MED ORDER — AZITHROMYCIN 250 MG PO TABS: ORAL_TABLET | ORAL | 0 refills | Status: DC

## 2017-06-14 MED ORDER — AZITHROMYCIN 500 MG PO TABS
500.0000 mg | ORAL_TABLET | Freq: Once | ORAL | Status: AC
  Administered 2017-06-14: 500 mg via ORAL
  Filled 2017-06-14: qty 1

## 2017-06-14 MED ORDER — PRENATAL VITAMINS 0.8 MG PO TABS: 1.0000 | ORAL_TABLET | Freq: Every day | ORAL | 0 refills | Status: DC

## 2017-06-14 NOTE — ED Notes (Signed)
FIRST NURSE NOTE: pt c/o cough with congestion, is here with 2 children with same sx.
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
## 2017-06-14 NOTE — ED Triage Notes (Addendum)
Pt reports fever, chills and cough x3 weeks. Pt just found out she was pregnant. Denies any symptoms related to that.

## 2017-07-15 ENCOUNTER — Emergency Department
Admission: EM | Admit: 2017-07-15 | Discharge: 2017-07-15 | Disposition: A | Discharge status: Home / Self Care | Payer: Medicaid Other | Attending: Student in an Organized Health Care Education/Training Program | Admitting: Student in an Organized Health Care Education/Training Program

## 2017-07-15 ENCOUNTER — Emergency Department: Payer: Medicaid Other

## 2017-07-15 DIAGNOSIS — O9989 Other specified diseases and conditions complicating pregnancy, childbirth and the puerperium: Secondary | ICD-10-CM | POA: Insufficient documentation

## 2017-07-15 DIAGNOSIS — Z3A01 Less than 8 weeks gestation of pregnancy: Secondary | ICD-10-CM

## 2017-07-15 DIAGNOSIS — E860 Dehydration: Secondary | ICD-10-CM | POA: Diagnosis not present

## 2017-07-15 DIAGNOSIS — R1013 Epigastric pain: Secondary | ICD-10-CM | POA: Diagnosis not present

## 2017-07-15 DIAGNOSIS — Z79899 Other long term (current) drug therapy: Secondary | ICD-10-CM | POA: Diagnosis not present

## 2017-07-15 DIAGNOSIS — R531 Weakness: Secondary | ICD-10-CM

## 2017-07-15 DIAGNOSIS — R079 Chest pain, unspecified: Secondary | ICD-10-CM | POA: Diagnosis present

## 2017-07-15 DIAGNOSIS — F17210 Nicotine dependence, cigarettes, uncomplicated: Secondary | ICD-10-CM | POA: Insufficient documentation

## 2017-07-15 LAB — CBC
HEMATOCRIT: 32.5 % — AB (ref 35.0–47.0)
Hemoglobin: 10.6 g/dL — ABNORMAL LOW (ref 12.0–16.0)
MCH: 27.3 pg (ref 26.0–34.0)
MCHC: 32.7 g/dL (ref 32.0–36.0)
MCV: 83.5 fL (ref 80.0–100.0)
Platelets: 199 10*3/uL (ref 150–440)
RBC: 3.89 MIL/uL (ref 3.80–5.20)
RDW: 14 % (ref 11.5–14.5)
WBC: 9.1 10*3/uL (ref 3.6–11.0)

## 2017-07-15 LAB — BASIC METABOLIC PANEL
Anion gap: 8 (ref 5–15)
BUN: 8 mg/dL (ref 6–20)
CHLORIDE: 108 mmol/L (ref 101–111)
CO2: 19 mmol/L — AB (ref 22–32)
Calcium: 8.6 mg/dL — ABNORMAL LOW (ref 8.9–10.3)
Creatinine, Ser: 0.5 mg/dL (ref 0.44–1.00)
GFR calc Af Amer: >60 mL/min (ref >60)
GFR calc non Af Amer: >60 mL/min (ref >60)
GLUCOSE: 82 mg/dL (ref 65–99)
POTASSIUM: 3.3 mmol/L — AB (ref 3.5–5.1)
Sodium: 135 mmol/L (ref 135–145)

## 2017-07-15 LAB — URINALYSIS, COMPLETE (UACMP) WITH MICROSCOPIC
BACTERIA UA: NONE SEEN
Bilirubin Urine: NEGATIVE
GLUCOSE, UA: NEGATIVE mg/dL
HGB URINE DIPSTICK: NEGATIVE
KETONES UR: NEGATIVE mg/dL
Leukocytes, UA: NEGATIVE
Nitrite: NEGATIVE
PROTEIN: NEGATIVE mg/dL
Specific Gravity, Urine: 1.018 (ref 1.005–1.030)
pH: 6 (ref 5.0–8.0)

## 2017-07-15 LAB — TROPONIN I: Troponin I: <0.03 ng/mL (ref <0.03)

## 2017-07-15 LAB — POCT PREGNANCY, URINE: Preg Test, Ur: POSITIVE — AB

## 2017-07-15 MED ORDER — RANITIDINE HCL 150 MG PO TABS: 150.0000 mg | ORAL_TABLET | Freq: Two times a day (BID) | ORAL | 0 refills | Status: DC

## 2017-07-15 MED ORDER — PROMETHAZINE HCL 25 MG/ML IJ SOLN
12.5000 mg | Freq: Four times a day (QID) | INTRAMUSCULAR | Status: DC | PRN
  Administered 2017-07-15: 12.5 mg via INTRAVENOUS
  Filled 2017-07-15: qty 1

## 2017-07-15 MED ORDER — ACETAMINOPHEN 500 MG PO TABS
1000.0000 mg | ORAL_TABLET | Freq: Once | ORAL | Status: AC
  Administered 2017-07-15: 1000 mg via ORAL
  Filled 2017-07-15: qty 2

## 2017-07-15 MED ORDER — METOCLOPRAMIDE HCL 10 MG PO TABS: 10.0000 mg | ORAL_TABLET | Freq: Four times a day (QID) | ORAL | 0 refills | Status: DC | PRN

## 2017-07-15 MED ORDER — DEXTROSE 5 % AND 0.45 % NACL IV BOLUS
1000.0000 mL | Freq: Once | INTRAVENOUS | Status: AC
  Administered 2017-07-15: 1000 mL via INTRAVENOUS

## 2017-07-15 MED ORDER — PROMETHAZINE HCL 25 MG/ML IJ SOLN
INTRAMUSCULAR | Status: AC
  Administered 2017-07-15: 12.5 mg via INTRAVENOUS
  Filled 2017-07-15: qty 1

## 2017-07-15 NOTE — ED Triage Notes (Signed)
Patient reports she is approx 2 months pregnant

## 2017-07-15 NOTE — Discharge Instructions (Addendum)
Sure to follow-up with your primary doctor as well as OB/GYN.  Return to the ER for worsening symptoms.  I prescribed Reglan for nausea as well as Zantac to help with any sort of gastritis or epigastric discomfort.  Please take Zantac twice daily.  You have been seen in the emergency department for emergency care. It is important that you contact your own doctor, specialist or the closest clinic for follow-up care. Please bring this instruction sheet, all medications and X-ray copies with you when you are seen for follow-up care.  Determining the exact cause for all patients with abdominal pain is extremely difficult in the emergency department. Our primary focus is to rule-out immediate life-threatening diseases. If no immediate source of pain is found the definitive diagnosis frequently needs to be determined over time.Many times your primary care physician can determine the cause by following the symptoms over time. Sometimes, specialist are required such as Gastroenterologists, Gynecologists, Urologists or Surgeons. Please return immediately to the Emergency Department for fever>101, Vomiting or Intractable Pain. You should return to the emergency department or see your primary care provider in 12-24hrs if your pain is no better and sooner if your pain becomes worse.
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
## 2017-07-15 NOTE — ED Notes (Signed)
FIRST NURSE NOTE: pt comes into the ED via EMS from home with c/o chest pain for the past 3 days. Pt smiling, in NAD on arrival. Pt states she just found out she is pregnant with her 4th child.

## 2017-07-15 NOTE — ED Triage Notes (Signed)
Patient c/o chest pain, weakness, loss of appetite.Patient reports radiation to posterior neck. Patient denies dizziness/lightheadedness, SOB, and emesis.

## 2017-09-02 ENCOUNTER — Emergency Department
Admission: EM | Admit: 2017-09-02 | Discharge: 2017-09-02 | Disposition: A | Discharge status: Home / Self Care | Payer: Medicaid Other | Attending: Emergency Medicine | Admitting: Emergency Medicine

## 2017-09-02 ENCOUNTER — Encounter: Payer: Self-pay | Admitting: Emergency Medicine

## 2017-09-02 ENCOUNTER — Emergency Department: Payer: Medicaid Other

## 2017-09-02 ENCOUNTER — Other Ambulatory Visit: Payer: Self-pay

## 2017-09-02 DIAGNOSIS — F17210 Nicotine dependence, cigarettes, uncomplicated: Secondary | ICD-10-CM | POA: Insufficient documentation

## 2017-09-02 DIAGNOSIS — O26892 Other specified pregnancy related conditions, second trimester: Secondary | ICD-10-CM | POA: Insufficient documentation

## 2017-09-02 DIAGNOSIS — Z720 Tobacco use: Secondary | ICD-10-CM

## 2017-09-02 DIAGNOSIS — Z3A20 20 weeks gestation of pregnancy: Secondary | ICD-10-CM | POA: Diagnosis not present

## 2017-09-02 DIAGNOSIS — R102 Pelvic and perineal pain: Secondary | ICD-10-CM | POA: Insufficient documentation

## 2017-09-02 DIAGNOSIS — Z79899 Other long term (current) drug therapy: Secondary | ICD-10-CM | POA: Insufficient documentation

## 2017-09-02 DIAGNOSIS — B9689 Other specified bacterial agents as the cause of diseases classified elsewhere: Secondary | ICD-10-CM | POA: Diagnosis not present

## 2017-09-02 DIAGNOSIS — N760 Acute vaginitis: Secondary | ICD-10-CM

## 2017-09-02 DIAGNOSIS — R109 Unspecified abdominal pain: Secondary | ICD-10-CM

## 2017-09-02 LAB — URINALYSIS, COMPLETE (UACMP) WITH MICROSCOPIC
BACTERIA UA: NONE SEEN
BILIRUBIN URINE: NEGATIVE
Glucose, UA: NEGATIVE mg/dL
HGB URINE DIPSTICK: NEGATIVE
KETONES UR: NEGATIVE mg/dL
LEUKOCYTES UA: NEGATIVE
Nitrite: NEGATIVE
PROTEIN: 30 mg/dL — AB
Specific Gravity, Urine: 1.023 (ref 1.005–1.030)
pH: 6 (ref 5.0–8.0)

## 2017-09-02 LAB — CBC WITH DIFFERENTIAL/PLATELET
Basophils Absolute: 0 10*3/uL (ref 0–0.1)
Basophils Relative: 0 %
Eosinophils Absolute: 0.1 10*3/uL (ref 0–0.7)
Eosinophils Relative: 1 %
HEMATOCRIT: 28.9 % — AB (ref 35.0–47.0)
Hemoglobin: 9.8 g/dL — ABNORMAL LOW (ref 12.0–16.0)
LYMPHS ABS: 2 10*3/uL (ref 1.0–3.6)
LYMPHS PCT: 21 %
MCH: 28.8 pg (ref 26.0–34.0)
MCHC: 33.8 g/dL (ref 32.0–36.0)
MCV: 85.2 fL (ref 80.0–100.0)
MONO ABS: 0.5 10*3/uL (ref 0.2–0.9)
Monocytes Relative: 5 %
NEUTROS ABS: 7 10*3/uL — AB (ref 1.4–6.5)
Neutrophils Relative %: 73 %
Platelets: 179 10*3/uL (ref 150–440)
RBC: 3.39 MIL/uL — AB (ref 3.80–5.20)
RDW: 14.7 % — ABNORMAL HIGH (ref 11.5–14.5)
WBC: 9.6 10*3/uL (ref 3.6–11.0)

## 2017-09-02 LAB — WET PREP, GENITAL
Sperm: NONE SEEN
Trich, Wet Prep: NONE SEEN
YEAST WET PREP: NONE SEEN

## 2017-09-02 LAB — COMPREHENSIVE METABOLIC PANEL
ALT: 27 U/L (ref 14–54)
ANION GAP: 5 (ref 5–15)
AST: 21 U/L (ref 15–41)
Albumin: 3.5 g/dL (ref 3.5–5.0)
Alkaline Phosphatase: 55 U/L (ref 38–126)
BILIRUBIN TOTAL: 0.1 mg/dL — AB (ref 0.3–1.2)
BUN: 9 mg/dL (ref 6–20)
CHLORIDE: 105 mmol/L (ref 101–111)
CO2: 25 mmol/L (ref 22–32)
Calcium: 9 mg/dL (ref 8.9–10.3)
Creatinine, Ser: 0.55 mg/dL (ref 0.44–1.00)
GFR calc Af Amer: >60 mL/min (ref >60)
GFR calc non Af Amer: >60 mL/min (ref >60)
GLUCOSE: 95 mg/dL (ref 65–99)
POTASSIUM: 3.5 mmol/L (ref 3.5–5.1)
Sodium: 135 mmol/L (ref 135–145)
TOTAL PROTEIN: 6.7 g/dL (ref 6.5–8.1)

## 2017-09-02 LAB — ETHANOL

## 2017-09-02 LAB — CHLAMYDIA/NGC RT PCR (ARMC ONLY)
Chlamydia Tr: NOT DETECTED
N GONORRHOEAE: NOT DETECTED

## 2017-09-02 LAB — CK: Total CK: 58 U/L (ref 38–234)

## 2017-09-02 MED ORDER — ACETAMINOPHEN 500 MG PO TABS
1000.0000 mg | ORAL_TABLET | Freq: Once | ORAL | Status: AC
  Administered 2017-09-02: 1000 mg via ORAL
  Filled 2017-09-02: qty 2

## 2017-09-02 MED ORDER — SODIUM CHLORIDE 0.9 % IV BOLUS
1000.0000 mL | Freq: Once | INTRAVENOUS | Status: AC
  Administered 2017-09-02: 1000 mL via INTRAVENOUS

## 2017-09-02 MED ORDER — FENTANYL CITRATE (PF) 100 MCG/2ML IJ SOLN
50.0000 ug | Freq: Once | INTRAMUSCULAR | Status: AC
  Administered 2017-09-02: 50 ug via INTRAVENOUS
  Filled 2017-09-02: qty 2

## 2017-09-02 MED ORDER — METRONIDAZOLE 500 MG PO TABS
500.0000 mg | ORAL_TABLET | Freq: Two times a day (BID) | ORAL | 0 refills | Status: AC
Start: 2017-09-02 — End: 2017-09-09

## 2017-09-02 NOTE — Discharge Instructions (Addendum)
Please take all of your antibiotics as prescribed and make an appointment to follow-up with the [facility] gynecologist this week for reevaluation.  Return to the emergency department sooner for any concerns whatsoever.  It was a pleasure to take care of you today, and thank you for coming to our emergency department.  If you have any questions or concerns before leaving please ask the nurse to grab me and I'm more than happy to go through your aftercare instructions again.  If you were prescribed any opioid pain medication today such as Norco, Vicodin, Percocet, morphine, hydrocodone, or oxycodone please make sure you do not drive when you are taking this medication as it can alter your ability to drive safely.  If you have any concerns once you are home that you are not improving or are in fact getting worse before you can make it to your follow-up appointment, please do not hesitate to call 911 and come back for further evaluation.  Merrily Brittle, MD  Results for orders placed or performed during the hospital encounter of 09/02/17  Wet prep, genital  Result Value Ref Range   Yeast Wet Prep HPF POC NONE SEEN NONE SEEN   Trich, Wet Prep NONE SEEN NONE SEEN   Clue Cells Wet Prep HPF POC PRESENT (A) NONE SEEN   WBC, Wet Prep HPF POC FEW (A) NONE SEEN   Sperm NONE SEEN   Ethanol  Result Value Ref Range   Alcohol, Ethyl (B) <10 <10 mg/dL  Comprehensive metabolic panel  Result Value Ref Range   Sodium 135 135 - 145 mmol/L   Potassium 3.5 3.5 - 5.1 mmol/L   Chloride 105 101 - 111 mmol/L   CO2 25 22 - 32 mmol/L   Glucose, Bld 95 65 - 99 mg/dL   BUN 9 6 - 20 mg/dL   Creatinine, Ser 1.61 0.44 - 1.00 mg/dL   Calcium 9.0 8.9 - 09.6 mg/dL   Total Protein 6.7 6.5 - 8.1 g/dL   Albumin 3.5 3.5 - 5.0 g/dL   AST 21 15 - 41 U/L   ALT 27 14 - 54 U/L   Alkaline Phosphatase 55 38 - 126 U/L   Total Bilirubin 0.1 (L) 0.3 - 1.2 mg/dL   GFR calc non Af Amer >60 >60 mL/min   GFR calc Af Amer >60 >60 mL/min   Anion gap 5 5 - 15  CBC with Differential  Result Value Ref Range   WBC 9.6 3.6 - 11.0 K/uL   RBC 3.39 (L) 3.80 - 5.20 MIL/uL   Hemoglobin 9.8 (L) 12.0 - 16.0 g/dL   HCT 04.5 (L) 40.9 - 81.1 %   MCV 85.2 80.0 - 100.0 fL   MCH 28.8 26.0 - 34.0 pg   MCHC 33.8 32.0 - 36.0 g/dL   RDW 91.4 (H) 78.2 - 95.6 %   Platelets 179 150 - 440 K/uL   Neutrophils Relative % 73 %   Neutro Abs 7.0 (H) 1.4 - 6.5 K/uL   Lymphocytes Relative 21 %   Lymphs Abs 2.0 1.0 - 3.6 K/uL   Monocytes Relative 5 %   Monocytes Absolute 0.5 0.2 - 0.9 K/uL   Eosinophils Relative 1 %   Eosinophils Absolute 0.1 0 - 0.7 K/uL   Basophils Relative 0 %   Basophils Absolute 0.0 0 - 0.1 K/uL  CK  Result Value Ref Range   Total CK 58 38 - 234 U/L  Urinalysis, Complete w Microscopic  Result Value Ref Range  Color, Urine AMBER (A) YELLOW   APPearance CLOUDY (A) CLEAR   Specific Gravity, Urine 1.023 1.005 - 1.030   pH 6.0 5.0 - 8.0   Glucose, UA NEGATIVE NEGATIVE mg/dL   Hgb urine dipstick NEGATIVE NEGATIVE   Bilirubin Urine NEGATIVE NEGATIVE   Ketones, ur NEGATIVE NEGATIVE mg/dL   Protein, ur 30 (A) NEGATIVE mg/dL   Nitrite NEGATIVE NEGATIVE   Leukocytes, UA NEGATIVE NEGATIVE   RBC / HPF 0-5 0 - 5 RBC/hpf   WBC, UA 0-5 0 - 5 WBC/hpf   Bacteria, UA NONE SEEN NONE SEEN   Squamous Epithelial / LPF 21-50 0 - 5   Mucus PRESENT    US Ob Limited > 14 Wks  Result Date: 09/02/2017 CLINICAL DATA:  Generalized abdominal pain for 1 hour. No prenatal care. Estimated gestational age by LMP is 20 weeks 1 day. EXAM: LIMITED OBSTETRIC ULTRASOUND FINDINGS: Number of Fetuses: 1 Heart Rate:  143 bpm Movement: Fetal movement is observed. Presentation: Breech Placental Location: Anterior Previa: No Amniotic Fluid (Subjective):  Within normal limits. BPD: 4.2 cm 18 w  5 d MATERNAL FINDINGS: Cervix:  Cervix is closed.  Cervical length measures 3.7 cm. Uterus/Adnexae: Limited visualization of the uterus. Ovaries are not seen. IMPRESSION:  Single intrauterine pregnancy in breech presentation. Fetal movement and cardiac activity are observed. Anterior placenta is not low lying. No acute complication is demonstrated sonographically on limited imaging. This exam is performed on an emergent basis and does not comprehensively evaluate fetal size, dating, or anatomy; follow-up complete OB US should be considered if further fetal assessment is warranted. Electronically Signed   By: Burman Nieves M.D.   On: 09/02/2017 02:24

## 2017-09-02 NOTE — ED Triage Notes (Signed)
Pt arrived to the ED via EMS from home for complaints of abdominal pain and possible pregnancy. Pt states that she knows that she is pregnant but she has not been to the Dr to know how far along she is. Pt states that she feels like she is in labor with discharge and LLQ pian. Pt is AOx4 in moderate pain. Dr. Lamont Snowball conducted a  Bedside ultrasound and baby is fully developred possibly on the second or third trimester.
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
## 2017-09-17 IMAGING — DX DG FOOT COMPLETE 3+V*L*
4 series · 4 of 4 positions shown · non-contrast
Comparison: None.

CLINICAL DATA: Dorsal left foot pain after injury today

EXAM:
LEFT FOOT - COMPLETE 3+ VIEW

[foot ap]
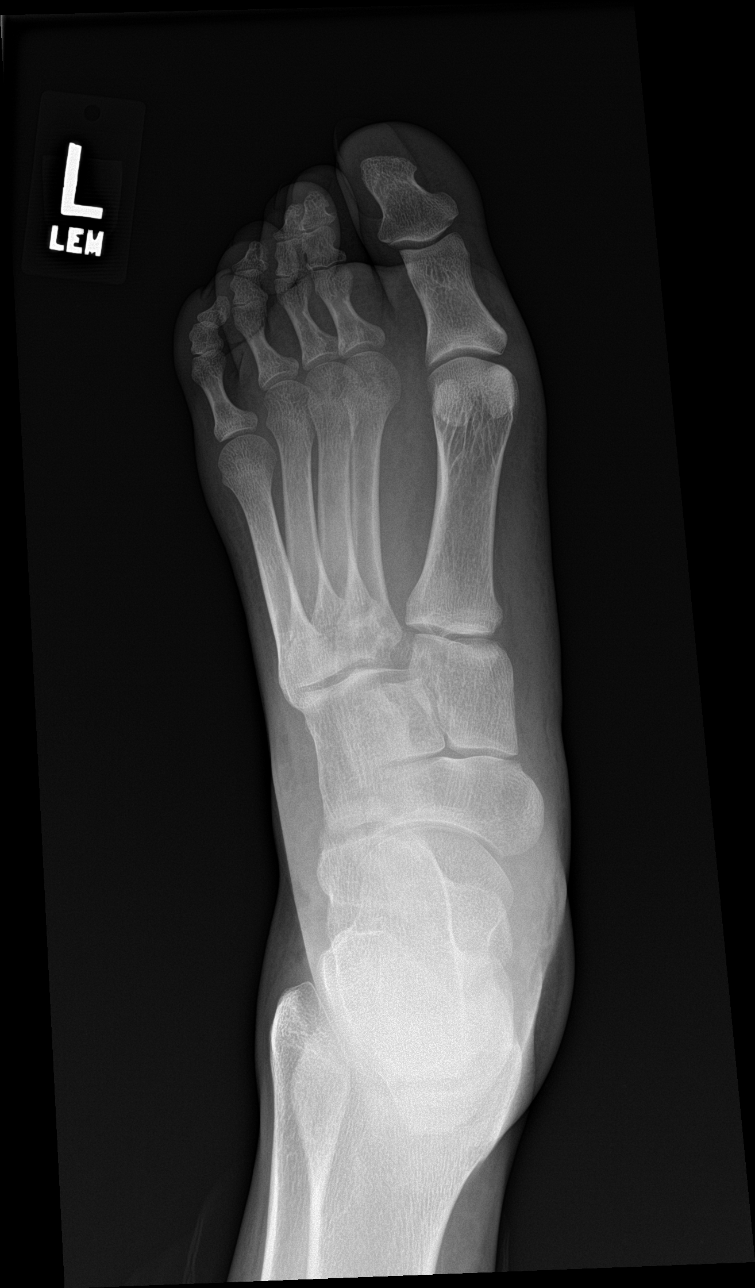

[foot obl (1 of 2)]
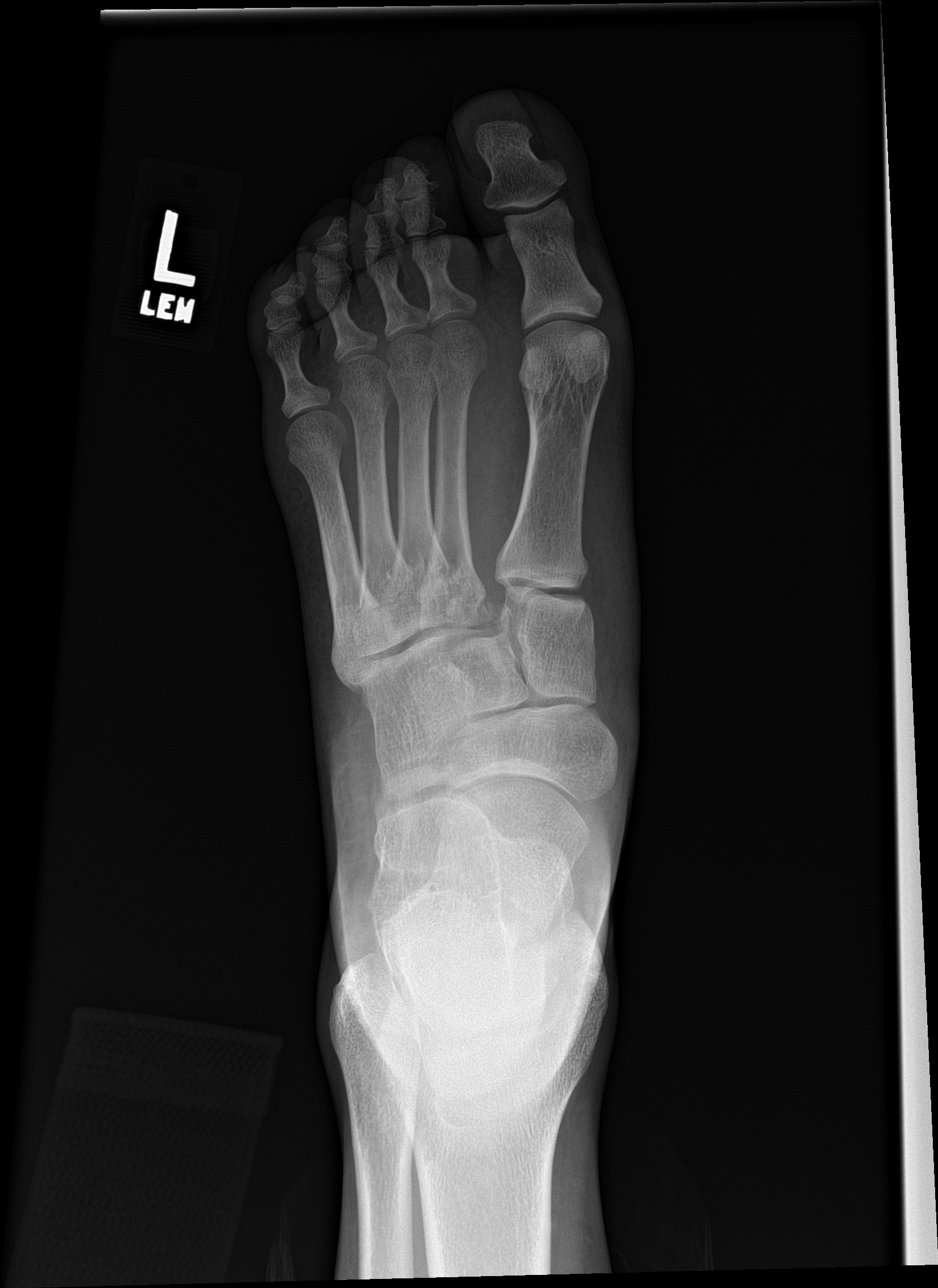

[foot lat]
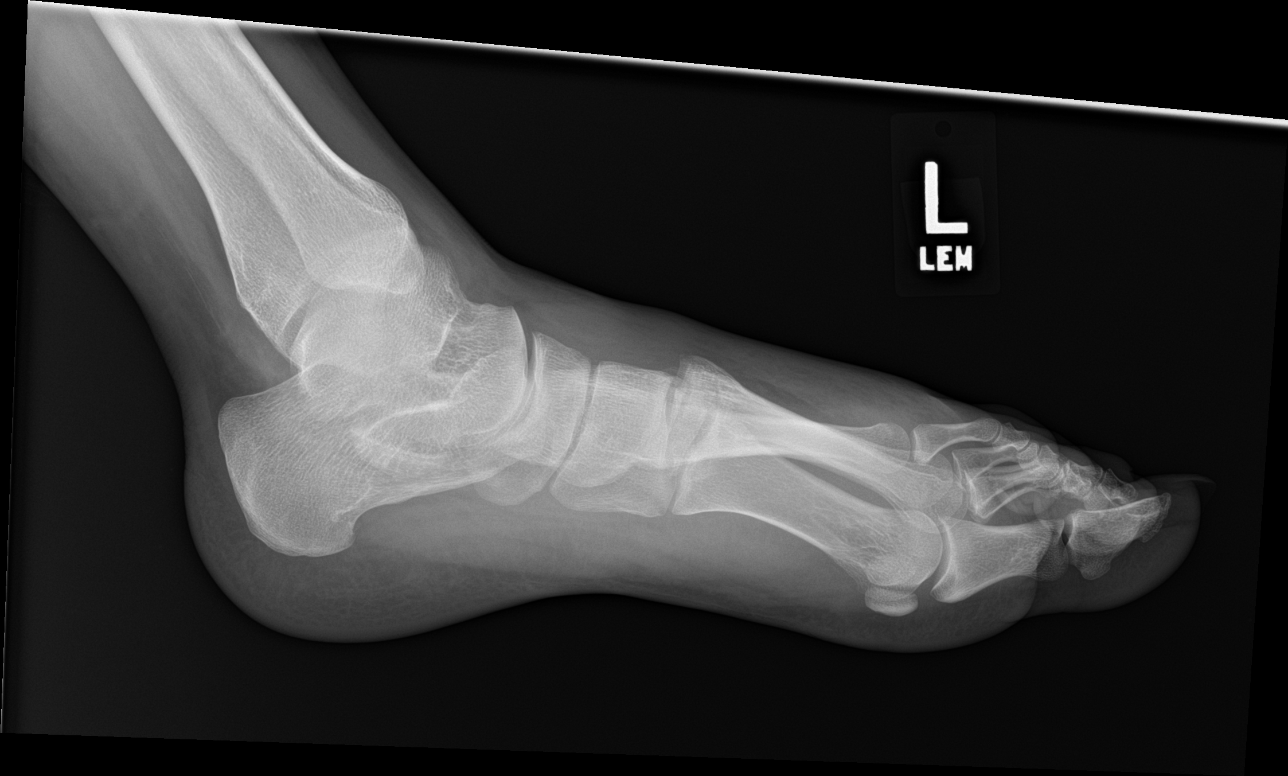

[foot obl (2 of 2)]
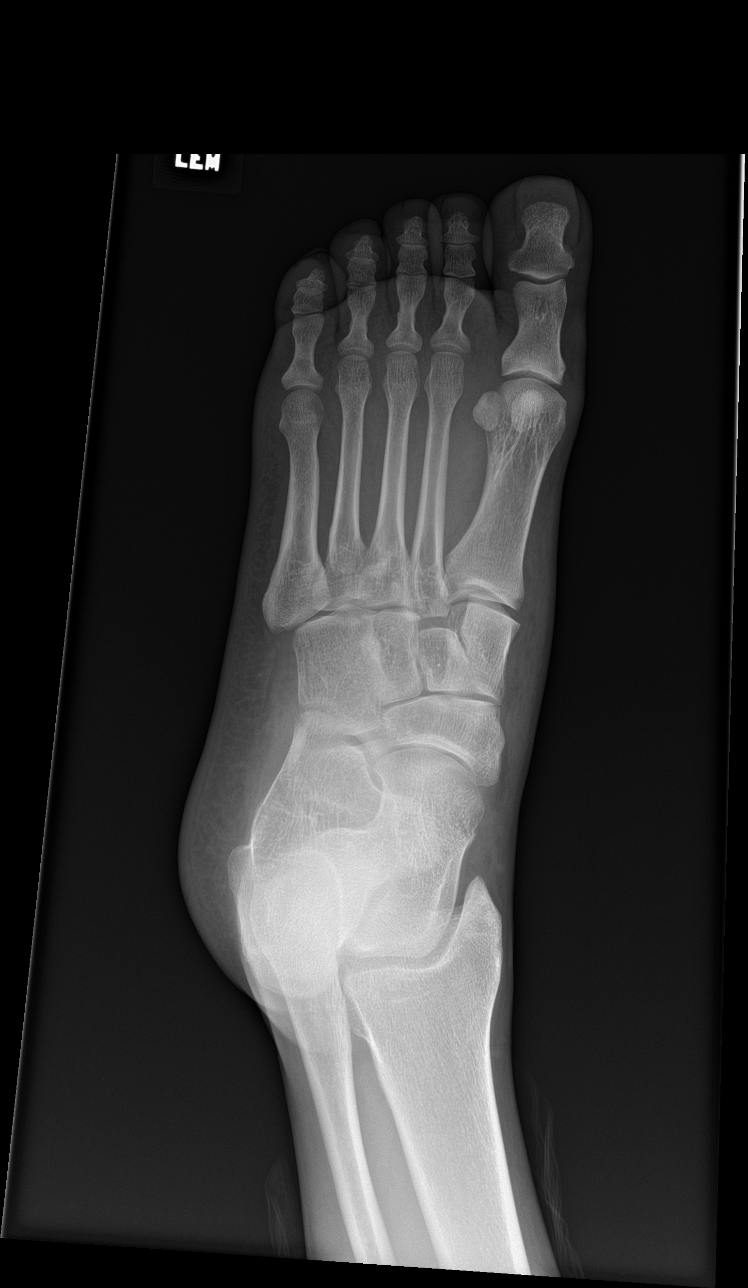

[4 of 4 positions shown; findings below may reference images not displayed]

FINDINGS: There is a comminuted intra-articular fracture through the medial
base of the is a horizontal lucency through the base of the left
fourth metatarsal, cannot exclude a nondisplaced fracture. No
suspicious focal osseous lesion. Moderate dorsal left foot soft
tissue swelling. No appreciable degenerative or erosive arthropathy.
No radiopaque foreign body.
IMPRESSION: 1. Lisfranc fracture-dislocation in the left foot as detailed.
2. Possible nondisplaced base of left fourth metatarsal fracture.
3. Orthopedic surgery consultation advised. Consider further
evaluation with CT of the left foot.

## 2017-09-29 ENCOUNTER — Other Ambulatory Visit: Payer: Self-pay | Admitting: Certified Nurse Midwife

## 2017-09-29 DIAGNOSIS — Z3689 Encounter for other specified antenatal screening: Secondary | ICD-10-CM

## 2017-10-01 ENCOUNTER — Ambulatory Visit (INDEPENDENT_AMBULATORY_CARE_PROVIDER_SITE_OTHER): Payer: Medicaid Other | Admitting: Certified Nurse Midwife

## 2017-10-01 ENCOUNTER — Encounter: Payer: Self-pay | Admitting: Certified Nurse Midwife

## 2017-10-01 ENCOUNTER — Ambulatory Visit (INDEPENDENT_AMBULATORY_CARE_PROVIDER_SITE_OTHER): Payer: Medicaid Other

## 2017-10-01 VITALS — BP 90/60 | HR 74 | Ht 62.0 in | Wt 147.0 lb

## 2017-10-01 DIAGNOSIS — Z3689 Encounter for other specified antenatal screening: Secondary | ICD-10-CM | POA: Diagnosis not present

## 2017-10-01 DIAGNOSIS — O0932 Supervision of pregnancy with insufficient antenatal care, second trimester: Secondary | ICD-10-CM

## 2017-10-01 DIAGNOSIS — Z3492 Encounter for supervision of normal pregnancy, unspecified, second trimester: Secondary | ICD-10-CM | POA: Diagnosis not present

## 2017-10-01 DIAGNOSIS — O0930 Supervision of pregnancy with insufficient antenatal care, unspecified trimester: Secondary | ICD-10-CM

## 2017-10-01 MED ORDER — METRONIDAZOLE 0.75 % VA GEL: 1.0000 | Freq: Every day | VAGINAL | 1 refills | Status: DC

## 2017-10-01 NOTE — Progress Notes (Signed)
New pt is here for a NOB physical. 

## 2017-10-01 NOTE — Patient Instructions (Signed)
Abdominal Pain During Pregnancy Belly (abdominal) pain is common during pregnancy. Most of the time, it is not a serious problem. Other times, it can be a sign that something is wrong with the pregnancy. Always tell your doctor if you have belly pain. Follow these instructions at home: Monitor your belly pain for any changes. The following actions may help you feel better:  Do not have sex (intercourse) or put anything in your vagina until you feel better.  Rest until your pain stops.  Drink clear fluids if you feel sick to your stomach (nauseous). Do not eat solid food until you feel better.  Only take medicine as told by your doctor.  Keep all doctor visits as told.  Get help right away if:  You are bleeding, leaking fluid, or pieces of tissue come out of your vagina.  You have more pain or cramping.  You keep throwing up (vomiting).  You have pain when you pee (urinate) or have blood in your pee.  You have a fever.  You do not feel your baby moving as much.  You feel very weak or feel like passing out.  You have trouble breathing, with or without belly pain.  You have a very bad headache and belly pain.  You have fluid leaking from your vagina and belly pain.  You keep having watery poop (diarrhea).  Your belly pain does not go away after resting, or the pain gets worse. This information is not intended to replace advice given to you by your health care provider. Make sure you discuss any questions you have with your health care provider. Document Released: 04/09/2009 Document Revised: 11/28/2015 Document Reviewed: 11/18/2012 Elsevier Interactive Patient Education  2018 [employer]. Back Pain in Pregnancy Back pain during pregnancy is common. Back pain may be caused by several factors that are related to changes during your pregnancy. Follow these instructions at home: Managing pain, stiffness, and swelling  If directed, apply ice for sudden (acute) back  pain. ? Put ice in a plastic bag. ? Place a towel between your skin and the bag. ? Leave the ice on for 20 minutes, 2-3 times per day.  If directed, apply heat to the affected area before you exercise: ? Place a towel between your skin and the heat pack or heating pad. ? Leave the heat on for 20-30 minutes. ? Remove the heat if your skin turns bright red. This is especially important if you are unable to feel pain, heat, or cold. You may have a greater risk of getting burned. Activity  Exercise as told by your health care provider. Exercising is the best way to prevent or manage back pain.  Listen to your body when lifting. If lifting hurts, ask for help or bend your knees. This uses your leg muscles instead of your back muscles.  Squat down when picking up something from the floor. Do not bend over.  Only use bed rest as told by your health care provider. Bed rest should only be used for the most severe episodes of back pain. Standing, Sitting, and Lying Down  Do not stand in one place for long periods of time.  Use good posture when sitting. Make sure your head rests over your shoulders and is not hanging forward. Use a pillow on your lower back if necessary.  Try sleeping on your side, preferably the left side, with a pillow or two between your legs. If you are sore after a night's rest, your bed may  be too soft. A firm mattress may provide more support for your back during pregnancy. General instructions  Do not wear high heels.  Eat a healthy diet. Try to gain weight within your health care provider's recommendations.  Use a maternity girdle, elastic sling, or back brace as told by your health care provider.  Take over-the-counter and prescription medicines only as told by your health care provider.  Keep all follow-up visits as told by your health care provider. This is important. This includes any visits with any specialists, such as a physical therapist. Contact a health  care provider if:  Your back pain interferes with your daily activities.  You have increasing pain in other parts of your body. Get help right away if:  You develop numbness, tingling, weakness, or problems with the use of your arms or legs.  You develop severe back pain that is not controlled with medicine.  You have a sudden change in bowel or bladder control.  You develop shortness of breath, dizziness, or you faint.  You develop nausea, vomiting, or sweating.  You have back pain that is a rhythmic, cramping pain similar to labor pains. Labor pain is usually 1-2 minutes apart, lasts for about 1 minute, and involves a bearing down feeling or pressure in your pelvis.  You have back pain and your water breaks or you have vaginal bleeding.  You have back pain or numbness that travels down your leg.  Your back pain developed after you fell.  You develop pain on one side of your back.  You see blood in your urine.  You develop skin blisters in the area of your back pain. This information is not intended to replace advice given to you by your health care provider. Make sure you discuss any questions you have with your health care provider. Document Released: 07/30/2005 Document Revised: 09/27/2015 Document Reviewed: 01/03/2015 Elsevier Interactive Patient Education  2018 [employer]. Common Medications Safe in Pregnancy  Acne:      Constipation:  Benzoyl Peroxide     Colace  Clindamycin      Dulcolax Suppository  Topica Erythromycin     Fibercon  Salicylic Acid      Metamucil         Miralax AVOID:        Senakot   Accutane    Cough:  Retin-A       Cough Drops  Tetracycline      Phenergan w/ Codeine if Rx  Minocycline      Robitussin (Plain & DM)  Antibiotics:     Crabs/Lice:  Ceclor       RID  Cephalosporins    AVOID:  E-Mycins      Kwell  Keflex  Macrobid/Macrodantin   Diarrhea:  Penicillin      Kao-Pectate  Zithromax      Imodium AD         PUSH  FLUIDS AVOID:       Cipro     Fever:  Tetracycline      Tylenol (Regular or Extra  Minocycline       Strength)  Levaquin      Extra Strength-Do not          Exceed 8 tabs/24 hrs Caffeine:        <232m/day (equiv. To 1 cup of coffee or  approx. 3 12 oz sodas)         Gas: Cold/Hayfever:       Gas-X  Benadryl  Mylicon  Claritin       Phazyme  **Claritin-D        Chlor-Trimeton    Headaches:  Dimetapp      ASA-Free Excedrin  Drixoral-Non-Drowsy     Cold Compress  Mucinex (Guaifenasin)     Tylenol (Regular or Extra  Sudafed/Sudafed-12 Hour     Strength)  **Sudafed PE Pseudoephedrine   Tylenol Cold & Sinus     Vicks Vapor Rub  Zyrtec  **AVOID if Problems With Blood Pressure         Heartburn: Avoid lying down for at least 1 hour after meals  Aciphex      Maalox     Rash:  Milk of Magnesia     Benadryl    Mylanta       1% Hydrocortisone Cream  Pepcid  Pepcid Complete   Sleep Aids:  Prevacid      Ambien   Prilosec       Benadryl  Rolaids       Chamomile Tea  Tums (Limit 4/day)     Unisom  Zantac       Tylenol PM         Warm milk-add vanilla or  Hemorrhoids:       Sugar for taste  Anusol/Anusol H.C.  (RX: Analapram 2.5%)  Sugar Substitutes:  Hydrocortisone OTC     Ok in moderation  Preparation H      Tucks        Vaseline lotion applied to tissue with wiping    Herpes:     Throat:  Acyclovir      Oragel  Famvir  Valtrex     Vaccines:         Flu Shot Leg Cramps:       *Gardasil  Benadryl      Hepatitis A         Hepatitis B Nasal Spray:       Pneumovax  Saline Nasal Spray     Polio Booster         Tetanus Nausea:       Tuberculosis test or PPD  Vitamin B6 25 mg TID   AVOID:    Dramamine      *Gardasil  Emetrol       Live Poliovirus  Ginger Root 250 mg QID    MMR (measles, mumps &  High Complex Carbs @ Bedtime    rebella)  Sea Bands-Accupressure    Varicella (Chickenpox)  Unisom 1/2 tab TID     *No known complications           If received  before Pain:         Known pregnancy;   Darvocet       Resume series after  Lortab        Delivery  Percocet    Yeast:   Tramadol      Femstat  Tylenol 3      Gyne-lotrimin  Ultram       Monistat  Vicodin           MISC:         All Sunscreens           Hair Coloring/highlights          Insect Repellant's          (Including DEET)         [employer] Tans Eating Plan for Pregnant Women While you are pregnant, your body will require additional nutrition to  help support your growing baby. It is recommended that you consume:  150 additional calories each day during your first trimester.  300 additional calories each day during your second trimester.  300 additional calories each day during your third trimester.  Eating a healthy, well-balanced diet is very important for your health and for your baby's health. You also have a higher need for some vitamins and minerals, such as folic acid, calcium, iron, and vitamin D. What do I need to know about eating during pregnancy?  Do not try to lose weight or go on a diet during pregnancy.  Choose healthy, nutritious foods. Choose  of a sandwich with a glass of milk instead of a candy bar or a high-calorie sugar-sweetened beverage.  Limit your overall intake of foods that have "empty calories." These are foods that have little nutritional value, such as sweets, desserts, candies, sugar-sweetened beverages, and fried foods.  Eat a variety of foods, especially fruits and vegetables.  Take a prenatal vitamin to help meet the additional needs during pregnancy, specifically for folic acid, iron, calcium, and vitamin D.  Remember to stay active. Ask your health care provider for exercise recommendations that are specific to you.  Practice good food safety and cleanliness, such as washing your hands before you eat and after you prepare raw meat. This helps to prevent foodborne illnesses, such as listeriosis, that can be very dangerous for your baby.  Ask your health care provider for more information about listeriosis. What does 150 extra calories look like? Healthy options for an additional 150 calories each day could be any of the following:  Plain low-fat yogurt (6-8 oz) with  cup of berries.  1 apple with 2 teaspoons of peanut butter.  Cut-up vegetables with  cup of hummus.  Low-fat chocolate milk (8 oz or 1 cup).  1 string cheese with 1 medium orange.   of a peanut butter and jelly sandwich on whole-wheat bread (1 tsp of peanut butter).  For 300 calories, you could eat two of those healthy options each day. What is a healthy amount of weight to gain? The recommended amount of weight for you to gain is based on your pre-pregnancy BMI. If your pre-pregnancy BMI was:  Less than 18 (underweight), you should gain 28-40 lb.  18-24.9 (normal), you should gain 25-35 lb.  25-29.9 (overweight), you should gain 15-25 lb.  Greater than 30 (obese), you should gain 11-20 lb.  What if I am having twins or multiples? Generally, pregnant women who will be having twins or multiples may need to increase their daily calories by 300-600 calories each day. The recommended range for total weight gain is 25-54 lb, depending on your pre-pregnancy BMI. Talk with your health care provider for specific guidance about additional nutritional needs, weight gain, and exercise during your pregnancy. What foods can I eat? Grains Any grains. Try to choose whole grains, such as whole-wheat bread, oatmeal, or brown rice. Vegetables Any vegetables. Try to eat a variety of colors and types of vegetables to get a full range of vitamins and minerals. Remember to wash your vegetables well before eating. Fruits Any fruits. Try to eat a variety of colors and types of fruit to get a full range of vitamins and minerals. Remember to wash your fruits well before eating. Meats and Other Protein Sources Lean meats, including chicken, [place], fish, and lean cuts of  beef, veal, or pork. Make sure that all meats are cooked to "well done." Tofu. Tempeh. Beans.  Eggs. Peanut butter and other nut butters. Seafood, such as shrimp, crab, and lobster. If you choose fish, select types that are higher in omega-3 fatty acids, including salmon, herring, mussels, trout, sardines, and pollock. Make sure that all meats are cooked to food-safe temperatures. Dairy Pasteurized milk and milk alternatives. Pasteurized yogurt and pasteurized cheese. Cottage cheese. Sour cream. Beverages Water. Juices that contain 100% fruit juice or vegetable juice. Caffeine-free teas and decaffeinated coffee. Drinks that contain caffeine are okay to drink, but it is better to avoid caffeine. Keep your total caffeine intake to less than 200 mg each day (12 oz of coffee, tea, or soda) or as directed by your health care provider. Condiments Any pasteurized condiments. Sweets and Desserts Any sweets and desserts. Fats and Oils Any fats and oils. The items listed above may not be a complete list of recommended foods or beverages. Contact your dietitian for more options. What foods are not recommended? Vegetables Unpasteurized (raw) vegetable juices. Fruits Unpasteurized (raw) fruit juices. Meats and Other Protein Sources Cured meats that have nitrates, such as bacon, salami, and hotdogs. Luncheon meats, bologna, or other deli meats (unless they are reheated until they are steaming hot). Refrigerated pate, meat spreads from a meat counter, smoked seafood that is found in the refrigerated section of a store. Raw fish, such as sushi or sashimi. High mercury content fish, such as tilefish, shark, swordfish, and king mackerel. Raw meats, such as tuna or beef tartare. Undercooked meats and poultry. Make sure that all meats are cooked to food-safe temperatures. Dairy Unpasteurized (raw) milk and any foods that have raw milk in them. Soft cheeses, such as feta, queso blanco, queso fresco, Brie, Camembert  cheeses, blue-veined cheeses, and Panela cheese (unless it is made with pasteurized milk, which must be stated on the label). Beverages Alcohol. Sugar-sweetened beverages, such as sodas, teas, or energy drinks. Condiments Homemade fermented foods and drinks, such as pickles, sauerkraut, or kombucha drinks. (Store-bought pasteurized versions of these are okay.) Other Salads that are made in the store, such as ham salad, chicken salad, egg salad, tuna salad, and seafood salad. The items listed above may not be a complete list of foods and beverages to avoid. Contact your dietitian for more information. This information is not intended to replace advice given to you by your health care provider. Make sure you discuss any questions you have with your health care provider. Document Released: 02/03/2014 Document Revised: 09/27/2015 Document Reviewed: 10/04/2013 Elsevier Interactive Patient Education  2018 [employer]. WHAT OB PATIENTS CAN EXPECT   Confirmation of pregnancy and ultrasound ordered if medically indicated-[redacted] weeks gestation  New OB (NOB) intake with nurse and New OB (NOB) labs- [redacted] weeks gestation  New OB (NOB) physical examination with provider- 11/[redacted] weeks gestation  Flu vaccine-[redacted] weeks gestation  Anatomy scan-[redacted] weeks gestation  Glucose tolerance test, blood work to test for anemia, T-dap vaccine-[redacted] weeks gestation  Vaginal swabs/cultures-STD/Group B strep-[redacted] weeks gestation  Appointments every 4 weeks until 28 weeks  Every 2 weeks from 28 weeks until 36 weeks  Weekly visits from 36 weeks until delivery  Second Trimester of Pregnancy The second trimester is from week 13 through week 28, month 4 through 6. This is often the time in pregnancy that you feel your best. Often times, morning sickness has lessened or quit. You may have more energy, and you may get hungry more often. Your unborn baby (fetus) is growing rapidly. At the end of the sixth month, he or she is  about 9  inches long and weighs about 1 pounds. You will likely feel the baby move (quickening) between 18 and 20 weeks of pregnancy. Follow these instructions at home:  Avoid all smoking, herbs, and alcohol. Avoid drugs not approved by your doctor.  Do not use any tobacco products, including cigarettes, chewing tobacco, and electronic cigarettes. If you need help quitting, ask your doctor. You may get counseling or other support to help you quit.  Only take medicine as told by your doctor. Some medicines are safe and some are not during pregnancy.  Exercise only as told by your doctor. Stop exercising if you start having cramps.  Eat regular, healthy meals.  Wear a good support bra if your breasts are tender.  Do not use hot tubs, steam rooms, or saunas.  Wear your seat belt when driving.  Avoid raw meat, uncooked cheese, and liter boxes and soil used by cats.  Take your prenatal vitamins.  Take 1500-2000 milligrams of calcium daily starting at the 20th week of pregnancy until you deliver your baby.  Try taking medicine that helps you poop (stool softener) as needed, and if your doctor approves. Eat more fiber by eating fresh fruit, vegetables, and whole grains. Drink enough fluids to keep your pee (urine) clear or pale yellow.  Take warm water baths (sitz baths) to soothe pain or discomfort caused by hemorrhoids. Use hemorrhoid cream if your doctor approves.  If you have puffy, bulging veins (varicose veins), wear support hose. Raise (elevate) your feet for 15 minutes, 3-4 times a day. Limit salt in your diet.  Avoid heavy lifting, wear low heals, and sit up straight.  Rest with your legs raised if you have leg cramps or low back pain.  Visit your dentist if you have not gone during your pregnancy. Use a soft toothbrush to brush your teeth. Be gentle when you floss.  You can have sex (intercourse) unless your doctor tells you not to.  Go to your doctor visits. Get help if:  You  feel dizzy.  You have mild cramps or pressure in your lower belly (abdomen).  You have a nagging pain in your belly area.  You continue to feel sick to your stomach (nauseous), throw up (vomit), or have watery poop (diarrhea).  You have bad smelling fluid coming from your vagina.  You have pain with peeing (urination). Get help right away if:  You have a fever.  You are leaking fluid from your vagina.  You have spotting or bleeding from your vagina.  You have severe belly cramping or pain.  You lose or gain weight rapidly.  You have trouble catching your breath and have chest pain.  You notice sudden or extreme puffiness (swelling) of your face, hands, ankles, feet, or legs.  You have not felt the baby move in over an hour.  You have severe headaches that do not go away with medicine.  You have vision changes. This information is not intended to replace advice given to you by your health care provider. Make sure you discuss any questions you have with your health care provider. Document Released: 07/16/2009 Document Revised: 09/27/2015 Document Reviewed: 06/22/2012 Elsevier Interactive Patient Education  2017 [employer].
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
## 2017-10-02 LAB — CBC WITH DIFFERENTIAL
BASOS ABS: 0 10*3/uL (ref 0.0–0.2)
BASOS: 0 %
EOS (ABSOLUTE): 0.1 10*3/uL (ref 0.0–0.4)
Eos: 2 %
Hematocrit: 25.4 % — ABNORMAL LOW (ref 34.0–46.6)
Hemoglobin: 8.6 g/dL — ABNORMAL LOW (ref 11.1–15.9)
IMMATURE GRANS (ABS): 0 10*3/uL (ref 0.0–0.1)
Immature Granulocytes: 0 %
LYMPHS: 25 %
Lymphocytes Absolute: 1.3 10*3/uL (ref 0.7–3.1)
MCH: 28.2 pg (ref 26.6–33.0)
MCHC: 33.9 g/dL (ref 31.5–35.7)
MCV: 83 fL (ref 79–97)
MONOCYTES: 7 %
Monocytes Absolute: 0.4 10*3/uL (ref 0.1–0.9)
NEUTROS PCT: 66 %
Neutrophils Absolute: 3.5 10*3/uL (ref 1.4–7.0)
RBC: 3.05 x10E6/uL — AB (ref 3.77–5.28)
RDW: 14.6 % (ref 12.3–15.4)
WBC: 5.3 10*3/uL (ref 3.4–10.8)

## 2017-10-02 LAB — ABO AND RH: Rh Factor: POSITIVE

## 2017-10-02 LAB — HIV ANTIBODY (ROUTINE TESTING W REFLEX): HIV Screen 4th Generation wRfx: NONREACTIVE

## 2017-10-02 LAB — ANTIBODY SCREEN: Antibody Screen: NEGATIVE

## 2017-10-02 LAB — SICKLE CELL SCREEN: Sickle Cell Screen: NEGATIVE

## 2017-10-02 LAB — RPR: RPR: NONREACTIVE

## 2017-10-02 LAB — HEPATITIS B SURFACE ANTIGEN: HEP B S AG: NEGATIVE

## 2017-10-02 LAB — RUBELLA SCREEN: RUBELLA: 6.68 {index} (ref >0.99)

## 2017-10-02 LAB — VARICELLA ZOSTER ANTIBODY, IGG: Varicella zoster IgG: 1825 index (ref >165)

## 2017-10-05 ENCOUNTER — Encounter: Payer: Self-pay | Admitting: Certified Nurse Midwife

## 2017-10-05 DIAGNOSIS — O99019 Anemia complicating pregnancy, unspecified trimester: Secondary | ICD-10-CM | POA: Insufficient documentation

## 2017-10-12 ENCOUNTER — Encounter: Payer: Self-pay | Admitting: Certified Nurse Midwife

## 2017-10-14 ENCOUNTER — Encounter: Payer: Self-pay | Admitting: Certified Nurse Midwife

## 2017-10-26 ENCOUNTER — Encounter: Payer: Self-pay | Admitting: Certified Nurse Midwife

## 2017-11-16 ENCOUNTER — Encounter: Payer: Self-pay | Admitting: Emergency Medicine

## 2017-11-16 ENCOUNTER — Inpatient Hospital Stay
Admission: EM | Admit: 2017-11-16 | Discharge: 2017-11-16 | Discharge status: Against Medical Advice | Payer: Medicaid Other | Source: Home / Self Care | Admitting: Obstetrics and Gynecology

## 2017-11-16 ENCOUNTER — Other Ambulatory Visit: Payer: Self-pay

## 2017-11-16 ENCOUNTER — Emergency Department
Admission: EM | Admit: 2017-11-16 | Discharge: 2017-11-16 | Disposition: A | Discharge status: Home / Self Care | Payer: Medicaid Other | Attending: Student in an Organized Health Care Education/Training Program | Admitting: Student in an Organized Health Care Education/Training Program

## 2017-11-16 DIAGNOSIS — Z87891 Personal history of nicotine dependence: Secondary | ICD-10-CM | POA: Diagnosis not present

## 2017-11-16 DIAGNOSIS — K089 Disorder of teeth and supporting structures, unspecified: Secondary | ICD-10-CM | POA: Diagnosis present

## 2017-11-16 DIAGNOSIS — K029 Dental caries, unspecified: Secondary | ICD-10-CM

## 2017-11-16 MED ORDER — ACETAMINOPHEN 325 MG PO TABS
ORAL_TABLET | ORAL | Status: AC
  Administered 2017-11-16: 650 mg via ORAL
  Filled 2017-11-16: qty 2

## 2017-11-16 MED ORDER — ACETAMINOPHEN 325 MG PO TABS
650.0000 mg | ORAL_TABLET | Freq: Once | ORAL | Status: AC
  Administered 2017-11-16: 650 mg via ORAL

## 2017-11-16 MED ORDER — LIDOCAINE VISCOUS HCL 2 % MT SOLN: OROMUCOSAL | 0 refills | Status: DC

## 2017-11-16 MED ORDER — ACETAMINOPHEN 325 MG PO TABS
ORAL_TABLET | ORAL | Status: AC
  Filled 2017-11-16: qty 2

## 2017-11-16 MED ORDER — ACETAMINOPHEN 325 MG PO TABS
650.0000 mg | ORAL_TABLET | Freq: Four times a day (QID) | ORAL | Status: DC | PRN
  Administered 2017-11-16: 650 mg via ORAL

## 2017-11-16 MED ORDER — AMOXICILLIN 500 MG PO CAPS: 500.0000 mg | ORAL_CAPSULE | Freq: Three times a day (TID) | ORAL | 0 refills | Status: DC

## 2017-11-16 NOTE — Progress Notes (Signed)
Pt refusing to give specimen for orders for UA and UDS. Does not want specimen sent to lab. Requesting to leave now. Pt educated regarding process for leaving AMA. Pt still requesting to leave. Signed AMA form. Pt stating she is going to return to the ER now for dental pain.

## 2017-11-16 NOTE — OB Triage Note (Signed)
Pt present from ED to L&D with c/o 9/10 right sided tooth pain. Pt took tylenol for pain that was not effective. Pt also has 6/10 abdominal pain that "comes and goes" Pt denies vaginal bleeding, leaking of fluid, or decreased fetal movement. Toco and EFM applied and expalined. All questions answered, will monitor closely.

## 2017-11-16 NOTE — ED Triage Notes (Signed)
Patient brought in by ems from home. Patient with complaint of right upper and lower dental pain times one week. Patient states that she is [redacted] weeks pregnant.

## 2017-11-16 NOTE — Discharge Instructions (Addendum)
OPTIONS FOR DENTAL FOLLOW UP CARE ° °[place] Department of Health and Human Services - Local Safety Net Dental Clinics °http://www.ncdhhs.gov/dph/oralhealth/services/safetynetclinics.htm °  °[facility] (336-562-3123) ° °[facility] [place] ([phone]) ° °[facility] ([phone] ext [phone]) ° °[facility] ([phone]) ° °[facility] ([phone]) °This clinic caters to the indigent population and is on a lottery system. °Location: °[facility], [address], [place] °Clinic Hours: °Wednesdays from 6pm - 9pm, patients seen by a lottery system. °For dates, call or go to [email or web address].[email or web address]/[facility]/patients/Dental-[facility] °Services: °Cleanings, fillings and simple extractions. °Payment Options: °DENTAL WORK IS FREE OF CHARGE. Bring proof of income or support. °Best way to get seen: °Arrive at 5:15 pm - this is a lottery, NOT first come/first serve, so arriving earlier will not increase your chances of being seen. °  °  °[facility] °[phone] °Select option 1 for emergencies °  °Location: °[facility], [address], [place] °Clinic Hours: °No walk-ins accepted - call the day before to schedule an appointment. °Check in times are 9:30 am and 1:30 pm. °Services: °Simple extractions, temporary fillings, pulpectomy/pulp debridement, uncomplicated abscess drainage. °Payment Options: °PAYMENT IS DUE AT THE TIME OF SERVICE.  Fee is usually $100-200, additional surgical procedures (e.g. abscess drainage) may be extra. °Cash, checks, Visa/MasterCard accepted.  Can file Medicaid if patient is covered for dental - patient should call case worker to check. °No discount for [facility] patients. °Best way to get seen: °MUST call the day before and get onto the schedule. Can usually be seen the next 1-2 days. No walk-ins accepted. °  °  °[facility] °[phone] °   °Location: °[facility], [address], [place] °Clinic Hours: °M, W, Th, F 8am or 1:30pm, Tues 9a or 1:30 - first come/first served. °Services: °Simple extractions, temporary fillings, uncomplicated abscess drainage.  You do not need to be an [place] resident. °Payment Options: °PAYMENT IS DUE AT THE TIME OF SERVICE. °Dental insurance, otherwise sliding scale - bring proof of income or support. °Depending on income and treatment needed, cost is usually $50-200. °Best way to get seen: °Arrive early as it is first come/first served. °  °  °[facility] °[phone] °  °Location: °[address] °Clinic Hours: °Mon-Thu 8a-5p °Services: °Most basic dental services including extractions and fillings. °Payment Options: °PAYMENT IS DUE AT THE TIME OF SERVICE. °Sliding scale, up to 50% off - bring proof if income or support. °Medicaid with dental option accepted. °Best way to get seen: °Call to schedule an appointment, can usually be seen within 2 weeks OR they will try to see walk-ins - show up at 8a or 2p (you may have to wait). °  °  °[facility] °[phone] °[place] RESIDENTS ONLY °  °Location: °[facility], [address], [place], [place] [address] °Clinic Hours: By appointment only. °Monday - Thursday 8am-5pm, Friday 8am-12pm °Services: Cleanings, fillings, extractions. °Payment Options: °PAYMENT IS DUE AT THE TIME OF SERVICE. °Cash, Visa or MasterCard. Sliding scale - $30 minimum per service. °Best way to get seen: °Come in to office, complete packet and make an appointment - need proof of income °or support monies for each household member and proof of [place] residence. °Usually takes about a month to get in. °  °  °[facility] °[phone] °  °Location: °[address]   [place] °Clinic Hours: Walk-in Urgent Care Dental Services are offered Monday-Friday  mornings only. °The numbers of emergencies accepted daily is limited to the number of °providers available. °Maximum 15 - Mondays, Wednesdays & Thursdays °Maximum 10 - Tuesdays & Fridays °Services: °You do not need to be a [place] resident to be seen for a dental emergency. °Emergencies are defined as pain, swelling, abnormal bleeding, or dental trauma. Walkins will receive x-rays if needed. °NOTE: Dental cleaning is not an emergency. °Payment Options: °PAYMENT IS DUE AT THE TIME OF SERVICE. °Minimum co-pay is $40.00 for uninsured patients. °Minimum co-pay is $3.00 for Medicaid with dental coverage. °Dental Insurance is accepted and must be presented at time of visit. °Medicare does not cover dental. °Forms of payment: Cash, credit card, checks. °Best way to get seen: °If not previously registered with the clinic, walk-in dental registration begins at 7:15 am and is on a first come/first serve basis. °If previously registered with the clinic, call to make an appointment. °  °  °[facility] °[phone] °[place] RESIDENTS ONLY °  °Location: °[address], [place], [place] °Clinic Hours: °Mon-Thu 10a-2p °Services: Extractions only! °Payment Options: °FREE (donations accepted) - bring proof of income or support °Best way to get seen: °Call and schedule an appointment OR come at 8am on the 1st Monday of every month (except for holidays) when it is first come/first served. °  °  °[facility] °[phone] °  °Location: °[address], [place] °Clinic Hours: °Friday mornings °Services, Payment Options, Best way to get seen: °Call for info °

## 2017-11-16 NOTE — ED Notes (Signed)
Patient up to stat desk asking how much longer she has to wait, explained process to patient.  Patient states "I'm [redacted] weeks pregnant why am I not going up to the other place."  Explained to patient that she would not go to L&D for dental pain.  Patient then reports that she is having abdominal pain.  Patient to triage for reassessment.

## 2017-11-16 NOTE — ED Triage Notes (Signed)
Dental pain.  Says came last night, but it  Was a long wait

## 2017-11-16 NOTE — ED Notes (Addendum)
See triage note  Presents with a 1 day hx of dental pain
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
## 2017-11-19 ENCOUNTER — Emergency Department
Admission: EM | Admit: 2017-11-19 | Discharge: 2017-11-19 | Disposition: A | Discharge status: Home / Self Care | Payer: Medicaid Other | Attending: Student in an Organized Health Care Education/Training Program | Admitting: Student in an Organized Health Care Education/Training Program

## 2017-11-19 ENCOUNTER — Encounter: Payer: Self-pay | Admitting: Emergency Medicine

## 2017-11-19 DIAGNOSIS — K0889 Other specified disorders of teeth and supporting structures: Secondary | ICD-10-CM

## 2017-11-19 DIAGNOSIS — K089 Disorder of teeth and supporting structures, unspecified: Secondary | ICD-10-CM | POA: Diagnosis not present

## 2017-11-19 DIAGNOSIS — Z87891 Personal history of nicotine dependence: Secondary | ICD-10-CM | POA: Diagnosis not present

## 2017-11-19 MED ORDER — BENZOCAINE 20 % MT GEL: 1.0000 "application " | Freq: Two times a day (BID) | OROMUCOSAL | 0 refills | Status: AC | PRN

## 2017-11-19 MED ORDER — BENZOCAINE 20 % MT GEL: 1.0000 "application " | Freq: Two times a day (BID) | OROMUCOSAL | 0 refills | Status: DC | PRN

## 2017-11-19 NOTE — ED Notes (Signed)
See triage note   Was seen on Monday  Placed on amoxil and tylenol for dental pain  States she is scheuled to see dentist in 2 weeks  Pain has increased   Pt is preg

## 2017-11-19 NOTE — ED Triage Notes (Signed)
Pt arrives with complaints of dental pain. Pt states she was seen and prescribed medication that has not been effective in pain management.

## 2017-11-19 NOTE — Discharge Instructions (Signed)
OPTIONS FOR DENTAL FOLLOW UP CARE ° °[place] Department of Health and Human Services - Local Safety Net Dental Clinics °http://www.ncdhhs.gov/dph/oralhealth/services/safetynetclinics.htm °  °[facility] (336-562-3123) ° °[facility] [place] ([phone]) ° °[facility] ([phone] ext [phone]) ° °[facility] ([phone]) ° °[facility] ([phone]) °This clinic caters to the indigent population and is on a lottery system. °Location: °[facility], [address], [place] °Clinic Hours: °Wednesdays from 6pm - 9pm, patients seen by a lottery system. °For dates, call or go to [email or web address].[facility].edu/[facility]/patients/Dental-[facility] °Services: °Cleanings, fillings and simple extractions. °Payment Options: °DENTAL WORK IS FREE OF CHARGE. Bring proof of income or support. °Best way to get seen: °Arrive at 5:15 pm - this is a lottery, NOT first come/first serve, so arriving earlier will not increase your chances of being seen. °  °  °[facility] °[phone] °Select option 1 for emergencies °  °Location: °[facility], [address], [place] °Clinic Hours: °No walk-ins accepted - call the day before to schedule an appointment. °Check in times are 9:30 am and 1:30 pm. °Services: °Simple extractions, temporary fillings, pulpectomy/pulp debridement, uncomplicated abscess drainage. °Payment Options: °PAYMENT IS DUE AT THE TIME OF SERVICE.  Fee is usually $100-200, additional surgical procedures (e.g. abscess drainage) may be extra. °Cash, checks, Visa/MasterCard accepted.  Can file Medicaid if patient is covered for dental - patient should call case worker to check. °No discount for [facility] patients. °Best way to get seen: °MUST call the day before and get onto the schedule. Can usually be seen the next 1-2 days. No walk-ins accepted. °  °  °[facility] °[phone] °   °Location: °[facility], [address], [place] °Clinic Hours: °M, W, Th, F 8am or 1:30pm, Tues 9a or 1:30 - first come/first served. °Services: °Simple extractions, temporary fillings, uncomplicated abscess drainage.  You do not need to be an [place] resident. °Payment Options: °PAYMENT IS DUE AT THE TIME OF SERVICE. °Dental insurance, otherwise sliding scale - bring proof of income or support. °Depending on income and treatment needed, cost is usually $50-200. °Best way to get seen: °Arrive early as it is first come/first served. °  °  °[facility] °[phone] °  °Location: °[address] °Clinic Hours: °Mon-Thu 8a-5p °Services: °Most basic dental services including extractions and fillings. °Payment Options: °PAYMENT IS DUE AT THE TIME OF SERVICE. °Sliding scale, up to 50% off - bring proof if income or support. °Medicaid with dental option accepted. °Best way to get seen: °Call to schedule an appointment, can usually be seen within 2 weeks OR they will try to see walk-ins - show up at 8a or 2p (you may have to wait). °  °  °[facility] °[phone] °[place] RESIDENTS ONLY °  °Location: °[facility], [address], [place], [place] [address] °Clinic Hours: By appointment only. °Monday - Thursday 8am-5pm, Friday 8am-12pm °Services: Cleanings, fillings, extractions. °Payment Options: °PAYMENT IS DUE AT THE TIME OF SERVICE. °Cash, Visa or MasterCard. Sliding scale - $30 minimum per service. °Best way to get seen: °Come in to office, complete packet and make an appointment - need proof of income °or support monies for each household member and proof of [place] residence. °Usually takes about a month to get in. °  °  °[facility] °[phone] °  °Location: °[address]   [place] °Clinic Hours: Walk-in Urgent Care Dental Services are offered Monday-Friday  mornings only. °The numbers of emergencies accepted daily is limited to the number of °providers available. °Maximum 15 - Mondays, Wednesdays & Thursdays °Maximum 10 - Tuesdays & Fridays °Services: °You do not need to be a [place] resident to be seen for a dental emergency. °Emergencies are defined as pain, swelling, abnormal bleeding, or dental trauma. Walkins will receive x-rays if needed. °NOTE: Dental cleaning is not an emergency. °Payment Options: °PAYMENT IS DUE AT THE TIME OF SERVICE. °Minimum co-pay is $40.00 for uninsured patients. °Minimum co-pay is $3.00 for Medicaid with dental coverage. °Dental Insurance is accepted and must be presented at time of visit. °Medicare does not cover dental. °Forms of payment: Cash, credit card, checks. °Best way to get seen: °If not previously registered with the clinic, walk-in dental registration begins at 7:15 am and is on a first come/first serve basis. °If previously registered with the clinic, call to make an appointment. °  °  °[facility] °[phone] °[place] RESIDENTS ONLY °  °Location: °[address], [place], [place] °Clinic Hours: °Mon-Thu 10a-2p °Services: Extractions only! °Payment Options: °FREE (donations accepted) - bring proof of income or support °Best way to get seen: °Call and schedule an appointment OR come at 8am on the 1st Monday of every month (except for holidays) when it is first come/first served. °  °  °[facility] °[phone] °  °Location: °[address], [place] °Clinic Hours: °Friday mornings °Services, Payment Options, Best way to get seen: °Call for info °
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
## 2017-11-19 NOTE — ED Notes (Signed)
Pt left after being seen and d/c by PA without d/c papers

## 2017-12-02 LAB — HM PAP SMEAR: HM Pap smear: NEGATIVE

## 2018-01-08 ENCOUNTER — Encounter: Payer: Self-pay | Admitting: Obstetrics and Gynecology

## 2018-01-08 NOTE — Progress Notes (Signed)
Induction of labor

## 2018-01-08 NOTE — Progress Notes (Signed)
Work note confirming induction of labor

## 2018-01-11 ENCOUNTER — Inpatient Hospital Stay
Admission: EM | Admit: 2018-01-11 | Discharge: 2018-01-13 | DRG: 806 | Disposition: A | Discharge status: Home / Self Care | Payer: Medicaid Other | Attending: Obstetrics and Gynecology | Admitting: Obstetrics and Gynecology

## 2018-01-11 DIAGNOSIS — D649 Anemia, unspecified: Secondary | ICD-10-CM | POA: Diagnosis present

## 2018-01-11 DIAGNOSIS — Z3A39 39 weeks gestation of pregnancy: Secondary | ICD-10-CM

## 2018-01-11 DIAGNOSIS — O0993 Supervision of high risk pregnancy, unspecified, third trimester: Secondary | ICD-10-CM

## 2018-01-11 DIAGNOSIS — O99324 Drug use complicating childbirth: Secondary | ICD-10-CM | POA: Diagnosis present

## 2018-01-11 DIAGNOSIS — F1990 Other psychoactive substance use, unspecified, uncomplicated: Secondary | ICD-10-CM | POA: Diagnosis present

## 2018-01-11 DIAGNOSIS — Z87891 Personal history of nicotine dependence: Secondary | ICD-10-CM

## 2018-01-11 DIAGNOSIS — O99013 Anemia complicating pregnancy, third trimester: Secondary | ICD-10-CM

## 2018-01-11 DIAGNOSIS — O365930 Maternal care for other known or suspected poor fetal growth, third trimester, not applicable or unspecified: Principal | ICD-10-CM | POA: Diagnosis present

## 2018-01-11 DIAGNOSIS — O9902 Anemia complicating childbirth: Secondary | ICD-10-CM | POA: Diagnosis present

## 2018-01-12 ENCOUNTER — Inpatient Hospital Stay: Payer: Medicaid Other | Admitting: Anesthesiology

## 2018-01-12 ENCOUNTER — Other Ambulatory Visit: Payer: Self-pay

## 2018-01-12 DIAGNOSIS — O99324 Drug use complicating childbirth: Secondary | ICD-10-CM | POA: Diagnosis present

## 2018-01-12 DIAGNOSIS — D649 Anemia, unspecified: Secondary | ICD-10-CM | POA: Diagnosis present

## 2018-01-12 DIAGNOSIS — Z3A39 39 weeks gestation of pregnancy: Secondary | ICD-10-CM | POA: Diagnosis not present

## 2018-01-12 DIAGNOSIS — O0993 Supervision of high risk pregnancy, unspecified, third trimester: Secondary | ICD-10-CM

## 2018-01-12 DIAGNOSIS — O9902 Anemia complicating childbirth: Secondary | ICD-10-CM | POA: Diagnosis present

## 2018-01-12 DIAGNOSIS — O365930 Maternal care for other known or suspected poor fetal growth, third trimester, not applicable or unspecified: Secondary | ICD-10-CM | POA: Diagnosis present

## 2018-01-12 DIAGNOSIS — Z87891 Personal history of nicotine dependence: Secondary | ICD-10-CM | POA: Diagnosis not present

## 2018-01-12 DIAGNOSIS — F1990 Other psychoactive substance use, unspecified, uncomplicated: Secondary | ICD-10-CM | POA: Diagnosis present

## 2018-01-12 LAB — URINE DRUG SCREEN, QUALITATIVE (ARMC ONLY)
AMPHETAMINES, UR SCREEN: NOT DETECTED
BENZODIAZEPINE, UR SCRN: NOT DETECTED
Barbiturates, Ur Screen: NOT DETECTED
CANNABINOID 50 NG, UR ~~LOC~~: NOT DETECTED
Cocaine Metabolite,Ur ~~LOC~~: NOT DETECTED
MDMA (Ecstasy)Ur Screen: NOT DETECTED
Methadone Scn, Ur: NOT DETECTED
Opiate, Ur Screen: NOT DETECTED
PHENCYCLIDINE (PCP) UR S: NOT DETECTED
Tricyclic, Ur Screen: NOT DETECTED

## 2018-01-12 LAB — TYPE AND SCREEN
ABO/RH(D): O POS
ANTIBODY SCREEN: NEGATIVE

## 2018-01-12 LAB — URINALYSIS, ROUTINE W REFLEX MICROSCOPIC
Bilirubin Urine: NEGATIVE
GLUCOSE, UA: NEGATIVE mg/dL
HGB URINE DIPSTICK: NEGATIVE
KETONES UR: 5 mg/dL — AB
LEUKOCYTES UA: NEGATIVE
Nitrite: NEGATIVE
PROTEIN: NEGATIVE mg/dL
Specific Gravity, Urine: 1.013 (ref 1.005–1.030)
pH: 6 (ref 5.0–8.0)

## 2018-01-12 LAB — CBC
HEMATOCRIT: 24.9 % — AB (ref 35.0–47.0)
Hemoglobin: 8.5 g/dL — ABNORMAL LOW (ref 12.0–16.0)
MCH: 29 pg (ref 26.0–34.0)
MCHC: 34.2 g/dL (ref 32.0–36.0)
MCV: 84.7 fL (ref 80.0–100.0)
Platelets: 164 10*3/uL (ref 150–440)
RBC: 2.94 MIL/uL — AB (ref 3.80–5.20)
RDW: 14.2 % (ref 11.5–14.5)
WBC: 7 10*3/uL (ref 3.6–11.0)

## 2018-01-12 MED ORDER — EPHEDRINE 5 MG/ML INJ
10.0000 mg | INTRAVENOUS | Status: AC | PRN
  Administered 2018-01-12 (×2): 10 mg via INTRAVENOUS

## 2018-01-12 MED ORDER — DIPHENHYDRAMINE HCL 50 MG/ML IJ SOLN
12.5000 mg | INTRAMUSCULAR | Status: AC | PRN
  Administered 2018-01-12 (×3): 12.5 mg via INTRAVENOUS
  Filled 2018-01-12 (×3): qty 1

## 2018-01-12 MED ORDER — FENTANYL 2.5 MCG/ML W/ROPIVACAINE 0.15% IN NS 100 ML EPIDURAL (ARMC)
EPIDURAL | Status: AC
  Filled 2018-01-12: qty 100

## 2018-01-12 MED ORDER — LIDOCAINE HCL (PF) 1 % IJ SOLN
INTRAMUSCULAR | Status: DC | PRN
  Administered 2018-01-12: 1 mL via INTRADERMAL

## 2018-01-12 MED ORDER — FERROUS SULFATE 325 (65 FE) MG PO TABS
325.0000 mg | ORAL_TABLET | Freq: Three times a day (TID) | ORAL | Status: DC
  Administered 2018-01-12 – 2018-01-13 (×3): 325 mg via ORAL
  Filled 2018-01-12 (×3): qty 1

## 2018-01-12 MED ORDER — LACTATED RINGERS IV SOLN
INTRAVENOUS | Status: DC
  Administered 2018-01-12 (×2): via INTRAVENOUS

## 2018-01-12 MED ORDER — WITCH HAZEL-GLYCERIN EX PADS: 1.0000 "application " | MEDICATED_PAD | CUTANEOUS | Status: DC | PRN

## 2018-01-12 MED ORDER — METHYLERGONOVINE MALEATE 0.2 MG/ML IJ SOLN
INTRAMUSCULAR | Status: AC
  Filled 2018-01-12: qty 1

## 2018-01-12 MED ORDER — ONDANSETRON HCL 4 MG/2ML IJ SOLN: 4.0000 mg | Freq: Four times a day (QID) | INTRAMUSCULAR | Status: DC | PRN

## 2018-01-12 MED ORDER — OXYTOCIN BOLUS FROM INFUSION
500.0000 mL | Freq: Once | INTRAVENOUS | Status: AC
  Administered 2018-01-12: 500 mL via INTRAVENOUS

## 2018-01-12 MED ORDER — DOCUSATE SODIUM 100 MG PO CAPS
100.0000 mg | ORAL_CAPSULE | Freq: Two times a day (BID) | ORAL | Status: DC
  Administered 2018-01-12 (×2): 100 mg via ORAL
  Filled 2018-01-12 (×2): qty 1

## 2018-01-12 MED ORDER — SODIUM CHLORIDE 0.9 % IV SOLN
2.0000 g | Freq: Once | INTRAVENOUS | Status: AC | PRN
  Administered 2018-01-12: 2 g via INTRAVENOUS
  Filled 2018-01-12: qty 2000

## 2018-01-12 MED ORDER — EPHEDRINE 5 MG/ML INJ
10.0000 mg | INTRAVENOUS | Status: DC | PRN
  Filled 2018-01-12: qty 4
  Filled 2018-01-12: qty 2

## 2018-01-12 MED ORDER — DIPHENHYDRAMINE HCL 25 MG PO CAPS
25.0000 mg | ORAL_CAPSULE | Freq: Four times a day (QID) | ORAL | Status: DC | PRN
  Administered 2018-01-12 – 2018-01-13 (×2): 25 mg via ORAL
  Filled 2018-01-12 (×2): qty 1

## 2018-01-12 MED ORDER — SODIUM CHLORIDE 0.9 % IV SOLN
INTRAVENOUS | Status: DC | PRN
  Administered 2018-01-12 (×2): 5 mL via EPIDURAL

## 2018-01-12 MED ORDER — LIDOCAINE HCL (PF) 1 % IJ SOLN
INTRAMUSCULAR | Status: AC
  Filled 2018-01-12: qty 30

## 2018-01-12 MED ORDER — COCONUT OIL OIL: 1.0000 "application " | TOPICAL_OIL | Status: DC | PRN

## 2018-01-12 MED ORDER — FENTANYL 2.5 MCG/ML W/ROPIVACAINE 0.15% IN NS 100 ML EPIDURAL (ARMC)
12.0000 mL/h | EPIDURAL | Status: DC
  Administered 2018-01-12: 12 mL/h via EPIDURAL

## 2018-01-12 MED ORDER — OXYCODONE HCL 5 MG PO TABS: 5.0000 mg | ORAL_TABLET | ORAL | Status: DC | PRN

## 2018-01-12 MED ORDER — SENNOSIDES-DOCUSATE SODIUM 8.6-50 MG PO TABS
2.0000 | ORAL_TABLET | ORAL | Status: DC
  Filled 2018-01-12: qty 2

## 2018-01-12 MED ORDER — ACETAMINOPHEN 325 MG PO TABS
650.0000 mg | ORAL_TABLET | ORAL | Status: DC | PRN
  Administered 2018-01-12 – 2018-01-13 (×2): 650 mg via ORAL
  Filled 2018-01-12: qty 2

## 2018-01-12 MED ORDER — ACETAMINOPHEN 325 MG PO TABS
650.0000 mg | ORAL_TABLET | ORAL | Status: DC | PRN
  Administered 2018-01-12 (×2): 650 mg via ORAL
  Filled 2018-01-12 (×5): qty 2

## 2018-01-12 MED ORDER — OXYTOCIN 40 UNITS IN LACTATED RINGERS INFUSION - SIMPLE MED
1.0000 m[IU]/min | INTRAVENOUS | Status: DC
  Administered 2018-01-12: 2 m[IU]/min via INTRAVENOUS
  Filled 2018-01-12: qty 1000

## 2018-01-12 MED ORDER — OXYTOCIN 10 UNIT/ML IJ SOLN
INTRAMUSCULAR | Status: AC
  Filled 2018-01-12: qty 2

## 2018-01-12 MED ORDER — OXYTOCIN 40 UNITS IN LACTATED RINGERS INFUSION - SIMPLE MED: 2.5000 [IU]/h | INTRAVENOUS | Status: DC

## 2018-01-12 MED ORDER — ZOLPIDEM TARTRATE 5 MG PO TABS: 5.0000 mg | ORAL_TABLET | Freq: Every evening | ORAL | Status: DC | PRN

## 2018-01-12 MED ORDER — PHENYLEPHRINE 40 MCG/ML (10ML) SYRINGE FOR IV PUSH (FOR BLOOD PRESSURE SUPPORT)
80.0000 ug | PREFILLED_SYRINGE | INTRAVENOUS | Status: DC | PRN
  Filled 2018-01-12: qty 5

## 2018-01-12 MED ORDER — BENZOCAINE-MENTHOL 20-0.5 % EX AERO: 1.0000 "application " | INHALATION_SPRAY | CUTANEOUS | Status: DC | PRN

## 2018-01-12 MED ORDER — BUTORPHANOL TARTRATE 1 MG/ML IJ SOLN: 1.0000 mg | INTRAMUSCULAR | Status: DC | PRN

## 2018-01-12 MED ORDER — SOD CITRATE-CITRIC ACID 500-334 MG/5ML PO SOLN: 30.0000 mL | ORAL | Status: DC | PRN

## 2018-01-12 MED ORDER — AMMONIA AROMATIC IN INHA
RESPIRATORY_TRACT | Status: AC
  Filled 2018-01-12: qty 10

## 2018-01-12 MED ORDER — SIMETHICONE 80 MG PO CHEW: 80.0000 mg | CHEWABLE_TABLET | ORAL | Status: DC | PRN

## 2018-01-12 MED ORDER — LIDOCAINE-EPINEPHRINE (PF) 1.5 %-1:200000 IJ SOLN
INTRAMUSCULAR | Status: DC | PRN
  Administered 2018-01-12: 3 mL via EPIDURAL

## 2018-01-12 MED ORDER — MISOPROSTOL 200 MCG PO TABS
ORAL_TABLET | ORAL | Status: AC
  Administered 2018-01-12: 800 ug
  Filled 2018-01-12: qty 4

## 2018-01-12 MED ORDER — LIDOCAINE HCL (PF) 1 % IJ SOLN: 30.0000 mL | INTRAMUSCULAR | Status: DC | PRN

## 2018-01-12 MED ORDER — DIBUCAINE 1 % RE OINT: 1.0000 "application " | TOPICAL_OINTMENT | RECTAL | Status: DC | PRN

## 2018-01-12 MED ORDER — OXYCODONE HCL 5 MG PO TABS
10.0000 mg | ORAL_TABLET | ORAL | Status: DC | PRN
  Administered 2018-01-12 – 2018-01-13 (×6): 10 mg via ORAL
  Filled 2018-01-12 (×6): qty 2

## 2018-01-12 MED ORDER — LACTATED RINGERS IV SOLN: 500.0000 mL | INTRAVENOUS | Status: DC | PRN

## 2018-01-12 MED ORDER — TERBUTALINE SULFATE 1 MG/ML IJ SOLN: 0.2500 mg | Freq: Once | INTRAMUSCULAR | Status: DC | PRN

## 2018-01-12 MED ORDER — FERROUS SULFATE 325 (65 FE) MG PO TABS: 325.0000 mg | ORAL_TABLET | Freq: Three times a day (TID) | ORAL | Status: DC

## 2018-01-12 MED ORDER — LACTATED RINGERS IV SOLN: 500.0000 mL | Freq: Once | INTRAVENOUS | Status: DC

## 2018-01-12 MED ORDER — PRENATAL MULTIVITAMIN CH
1.0000 | ORAL_TABLET | Freq: Every day | ORAL | Status: DC
  Administered 2018-01-12: 1 via ORAL
  Filled 2018-01-12: qty 1

## 2018-01-12 MED ORDER — CARBOPROST TROMETHAMINE 250 MCG/ML IM SOLN
INTRAMUSCULAR | Status: AC
  Filled 2018-01-12: qty 1

## 2018-01-12 MED ORDER — MISOPROSTOL 25 MCG QUARTER TABLET: 25.0000 ug | ORAL_TABLET | ORAL | Status: DC | PRN

## 2018-01-12 MED ORDER — ONDANSETRON HCL 4 MG PO TABS: 4.0000 mg | ORAL_TABLET | ORAL | Status: DC | PRN

## 2018-01-12 MED ORDER — SODIUM CHLORIDE 0.9 % IV SOLN
1.0000 g | INTRAVENOUS | Status: DC
  Administered 2018-01-12: 1 g via INTRAVENOUS
  Filled 2018-01-12 (×5): qty 1000

## 2018-01-12 MED ORDER — IBUPROFEN 600 MG PO TABS
600.0000 mg | ORAL_TABLET | Freq: Four times a day (QID) | ORAL | Status: DC
  Administered 2018-01-12 – 2018-01-13 (×4): 600 mg via ORAL
  Filled 2018-01-12 (×4): qty 1

## 2018-01-12 MED ORDER — ONDANSETRON HCL 4 MG/2ML IJ SOLN: 4.0000 mg | INTRAMUSCULAR | Status: DC | PRN

## 2018-01-12 NOTE — Anesthesia Preprocedure Evaluation (Signed)
Anesthesia Evaluation  Patient identified by MRN, date of birth, ID band Patient awake    Reviewed: Allergy & Precautions, H&P , NPO status , Patient's Chart, lab work & pertinent test results  History of Anesthesia Complications Negative for: history of anesthetic complications  Airway Mallampati: III  TM Distance: >3 FB Neck ROM: full    Dental  (+) Chipped   Pulmonary former smoker,           Cardiovascular Exercise Tolerance: Good (-) hypertension     Neuro/Psych PSYCHIATRIC DISORDERS Anxiety Depression Bipolar Disorder    GI/Hepatic negative GI ROS,   Endo/Other    Renal/GU   negative genitourinary   Musculoskeletal   Abdominal   Peds  Hematology  (+) Blood dyscrasia, anemia ,   Anesthesia Other Findings Past Medical History: No date: Abnormal Pap smear of cervix No date: Anemia No date: Anxiety 2012: Auditory hallucination No date: Pica in adults 2012: Severe major depression with psychotic features (HCC) No date: UTI (urinary tract infection)  Past Surgical History: No date: ABDOMINAL SURGERY 2007: SALPINGECTOMY     Comment:  partial  BMI    Body Mass Index:  26.52 kg/m      Reproductive/Obstetrics (+) Pregnancy                             Anesthesia Physical Anesthesia Plan  ASA: III  Anesthesia Plan: Epidural   Post-op Pain Management:    Induction:   PONV Risk Score and Plan:   Airway Management Planned:   Additional Equipment:   Intra-op Plan:   Post-operative Plan:   Informed Consent: I have reviewed the patients History and Physical, chart, labs and discussed the procedure including the risks, benefits and alternatives for the proposed anesthesia with the patient or authorized representative who has indicated his/her understanding and acceptance.     Plan Discussed with: Anesthesiologist  Anesthesia Plan Comments:         Anesthesia  Quick Evaluation

## 2018-01-12 NOTE — Anesthesia Procedure Notes (Signed)
Epidural Patient location during procedure: OB Start time: 01/12/2018 2:46 AM End time: 01/12/2018 2:54 AM  Staffing Anesthesiologist: Kye Hedden, Cleda Mccreedy, MD Performed: anesthesiologist   Preanesthetic Checklist Completed: patient identified, site marked, surgical consent, pre-op evaluation, timeout performed, IV checked, risks and benefits discussed and monitors and equipment checked  Epidural Patient position: sitting Prep: ChloraPrep Patient monitoring: heart rate, continuous pulse ox and blood pressure Approach: midline Location: L3-L4 Injection technique: LOR saline  Needle:  Needle type: Tuohy  Needle gauge: 17 G Needle length: 9 cm and 9 Needle insertion depth: 6 cm Catheter type: closed end flexible Catheter size: 19 Gauge Catheter at skin depth: 11 cm Test dose: negative and 1.5% lidocaine with Epi 1:200 K  Assessment Sensory level: T10 Events: blood not aspirated, injection not painful, no injection resistance, negative IV test and no paresthesia  Additional Notes 1 attempt.  Transient right sided leg paresthesia with catheter placement that resolved with catheter retraction to 11 cm at the skin Pt. Evaluated and documentation done after procedure finished. Patient identified. Risks/Benefits/Options discussed with patient including but not limited to bleeding, infection, nerve damage, paralysis, failed block, incomplete pain control, headache, blood pressure changes, nausea, vomiting, reactions to medication both or allergic, itching and postpartum back pain. Confirmed with bedside nurse the patient's most recent platelet count. Confirmed with patient that they are not currently taking any anticoagulation, have any bleeding history or any family history of bleeding disorders. Patient expressed understanding and wished to proceed. All questions were answered. Sterile technique was used throughout the entire procedure. Please see nursing notes for vital signs. Test dose  was given through epidural catheter and negative prior to continuing to dose epidural or start infusion. Warning signs of high block given to the patient including shortness of breath, tingling/numbness in hands, complete motor block, or any concerning symptoms with instructions to call for help. Patient was given instructions on fall risk and not to get out of bed. All questions and concerns addressed with instructions to call with any issues or inadequate analgesia.   Patient tolerated the insertion well without immediate complications.Reason for block:procedure for pain

## 2018-01-12 NOTE — Progress Notes (Signed)
Pt. Fundus 1/U and to the left. Patient encouraged to void. RN previously explained importance of voiding, and that patient would need to void soon. Pt verbalizes understanding. Pt. agrees to get up to void, but then refuses when NT tries to get her up, stating "I'm not getting up. I said don't have to pee. What's the point. I'm not going to pee." RN to continue to educate and encourage patient.
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
## 2018-01-12 NOTE — Progress Notes (Signed)
Labor Progress Note  [name] Young is a 32 y.o. [id number] at [redacted]w[redacted]d by LMP and c/w [place] at 18+5wks.  She presents to L&D for IOL secondary to limited prenatal care, hx cocaine use and suspected IUGR at term.    Subjective: comfortable s/p epidural.   Objective: BP (!) 94/51   Pulse 80   Temp 98.2 F (36.8 C) (Oral)   Resp 16   Ht 5\' 2"  (1.575 m)   Wt 65.8 kg   LMP 04/14/2017 (Approximate)   SpO2 100%   BMI 26.52 kg/m  Notable VS details: reviewed  Fetal Assessment: FHT:  FHR: 140 bpm, variability: moderate,  accelerations:  Present,  decelerations:  Absent Category/reactivity:  Category I UC:   regular, every 2-4 minutes; Pitocin at 45mu/min SVE:   Dilation: 5 Effacement (%): 80 Cervical Position: Middle Station: -1 Presentation: Vertex Exam by:: R. Mcvey CNM  Membrane status: slightly BBOW noted, AROM clear fluid   Labs: Lab Results  Component Value Date   WBC 7.0 01/12/2018   HGB 8.5 (L) 01/12/2018   HCT 24.9 (L) 01/12/2018   MCV 84.7 01/12/2018   PLT 164 01/12/2018    Assessment / Plan: IOL at 39+0wks for late/limited PNC, polysusbstance use and suspected IUGR   Labor: s/p epidural, Pitocin and now AROM.  Preeclampsia:  No e/o Pre-e Fetal Wellbeing:  Category I Pain Control:  Epidural I/D:  Ampicillin 1 gm IVPB due at 0630 Anticipated MOD:  NSVD  McVey, REBECCA A, CNM 01/12/2018, 5:38 AM

## 2018-01-13 LAB — CBC
HCT: 25.3 % — ABNORMAL LOW (ref 35.0–47.0)
HEMOGLOBIN: 8.8 g/dL — AB (ref 12.0–16.0)
MCH: 29.2 pg (ref 26.0–34.0)
MCHC: 34.6 g/dL (ref 32.0–36.0)
MCV: 84.2 fL (ref 80.0–100.0)
Platelets: 142 10*3/uL — ABNORMAL LOW (ref 150–440)
RBC: 3 MIL/uL — ABNORMAL LOW (ref 3.80–5.20)
RDW: 13.8 % (ref 11.5–14.5)
WBC: 7.7 10*3/uL (ref 3.6–11.0)

## 2018-01-13 LAB — RPR: RPR Ser Ql: NONREACTIVE

## 2018-01-13 MED ORDER — OXYCODONE HCL 5 MG PO TABS: 5.0000 mg | ORAL_TABLET | ORAL | 0 refills | Status: DC | PRN

## 2018-01-13 MED ORDER — IBUPROFEN 600 MG PO TABS: 600.0000 mg | ORAL_TABLET | Freq: Four times a day (QID) | ORAL | 0 refills | Status: DC

## 2018-01-13 MED ORDER — MEDROXYPROGESTERONE ACETATE 150 MG/ML IM SUSP
150.0000 mg | Freq: Once | INTRAMUSCULAR | Status: AC
  Administered 2018-01-13: 150 mg via INTRAMUSCULAR
  Filled 2018-01-13: qty 1

## 2018-01-13 MED ORDER — BENZOCAINE-MENTHOL 20-0.5 % EX AERO: 1.0000 "application " | INHALATION_SPRAY | CUTANEOUS | 1 refills | Status: DC | PRN

## 2018-01-13 NOTE — Progress Notes (Signed)
Called by nursing to discuss tubal with patient.   She delivered at 07:30 this morning and is demanding her tubal be done today so she can be discharged home tomorrow.  Explained to patient that her hemoglobin was low, and elective surgery is risky.  A better option for her is to be discharged and return in 4 weeks for an interval laparoscopic tubal.  She states she has no help or child care for this purpose.  She is adamant it must be done today.  I offered to give the patient a blood transfusion to hopefully elevate her blood count to ~10 or above, and the do her tubal tomorrow AM.  She could still potentially be discharged later tomorrow.  At first she agreed and then changed her mind, and asked for depo instead.  She admits that she would probably not be compliant with this regimen.  She is a great candidate for a tubal, and I would hate to deny her that opportunity.   After speaking to her mother, she decided that she would be able to come to the hosptial for a same-day interval tubal, and her mother would help her out with childcare.    I would like to check into the LARC program grant, to see if she would qualify for a free Nexplanon or IUD.  If so we could insert in hospital tomorrow.   Will discuss tomorrow AM.  ----- Ranae Plumber, MD Attending Obstetrician and Gynecologist [facility], Department of OB/GYN [facility]  35 mins spent with patient face to face.  15 spent writing this note, and researching LARC options.

## 2018-01-13 NOTE — Anesthesia Postprocedure Evaluation (Signed)
Anesthesia Post Note  Patient: [name] Young  Procedure(s) Performed: AN AD HOC LABOR EPIDURAL  Patient location during evaluation: Mother Baby Anesthesia Type: Epidural Level of consciousness: awake and alert Pain management: pain level controlled Vital Signs Assessment: post-procedure vital signs reviewed and stable Respiratory status: spontaneous breathing, nonlabored ventilation and respiratory function stable Cardiovascular status: stable Postop Assessment: no headache, no backache, patient able to bend at knees and able to ambulate Anesthetic complications: no     Last Vitals:  Vitals:   01/13/18 0518 01/13/18 0725  BP: 101/61 117/78  Pulse: (!) 53 (!) 50  Resp: 20 20  Temp: 36.6 C 36.9 C  SpO2: 100% 100%    Last Pain:  Vitals:   01/13/18 0725  TempSrc: Oral  PainSc:                  Cleda Mccreedy [place]

## 2018-01-13 NOTE — Progress Notes (Signed)
Social worker here to see pt and trying to find her a car seat
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
## 2018-01-13 NOTE — Discharge Instructions (Signed)
Make appointment to see Dr. Elesa Massed in 2 wks at her office.  At this appointment, plans will be made to schedule PP BTL.

## 2018-01-13 NOTE — Progress Notes (Signed)
Dc instr given pt verb u/o of f/u appt in 2 wks with Dr Elesa Massed for BTL prep

## 2018-01-13 NOTE — Progress Notes (Signed)
Car seat given for home use

## 2018-02-12 ENCOUNTER — Encounter: Payer: Self-pay | Admitting: Emergency Medicine

## 2018-02-12 ENCOUNTER — Emergency Department
Admission: EM | Admit: 2018-02-12 | Discharge: 2018-02-12 | Disposition: A | Discharge status: Home / Self Care | Payer: Medicaid Other | Attending: Emergency Medicine | Admitting: Emergency Medicine

## 2018-02-12 ENCOUNTER — Other Ambulatory Visit: Payer: Self-pay

## 2018-02-12 DIAGNOSIS — N941 Unspecified dyspareunia: Secondary | ICD-10-CM

## 2018-02-12 DIAGNOSIS — M549 Dorsalgia, unspecified: Secondary | ICD-10-CM | POA: Diagnosis not present

## 2018-02-12 DIAGNOSIS — Z87891 Personal history of nicotine dependence: Secondary | ICD-10-CM | POA: Diagnosis not present

## 2018-02-12 DIAGNOSIS — R102 Pelvic and perineal pain: Secondary | ICD-10-CM | POA: Diagnosis not present

## 2018-02-12 LAB — URINALYSIS, COMPLETE (UACMP) WITH MICROSCOPIC
BACTERIA UA: NONE SEEN
Bilirubin Urine: NEGATIVE
Glucose, UA: NEGATIVE mg/dL
Hgb urine dipstick: NEGATIVE
Ketones, ur: NEGATIVE mg/dL
Leukocytes, UA: NEGATIVE
NITRITE: NEGATIVE
PROTEIN: NEGATIVE mg/dL
SPECIFIC GRAVITY, URINE: 1.019 (ref 1.005–1.030)
pH: 8 (ref 5.0–8.0)

## 2018-02-12 LAB — COMPREHENSIVE METABOLIC PANEL
ALBUMIN: 4.7 g/dL (ref 3.5–5.0)
ALT: 12 U/L (ref 0–44)
AST: 17 U/L (ref 15–41)
Alkaline Phosphatase: 98 U/L (ref 38–126)
Anion gap: 8 (ref 5–15)
BILIRUBIN TOTAL: 0.3 mg/dL (ref 0.3–1.2)
BUN: 20 mg/dL (ref 6–20)
CO2: 26 mmol/L (ref 22–32)
Calcium: 9.4 mg/dL (ref 8.9–10.3)
Chloride: 105 mmol/L (ref 98–111)
Creatinine, Ser: 1.05 mg/dL — ABNORMAL HIGH (ref 0.44–1.00)
GFR calc Af Amer: >60 mL/min (ref >60)
GFR calc non Af Amer: >60 mL/min (ref >60)
GLUCOSE: 88 mg/dL (ref 70–99)
POTASSIUM: 4 mmol/L (ref 3.5–5.1)
Sodium: 139 mmol/L (ref 135–145)
TOTAL PROTEIN: 8 g/dL (ref 6.5–8.1)

## 2018-02-12 LAB — CBC
HCT: 36.3 % (ref 36.0–46.0)
HEMOGLOBIN: 11.8 g/dL — AB (ref 12.0–15.0)
MCH: 27.4 pg (ref 26.0–34.0)
MCHC: 32.5 g/dL (ref 30.0–36.0)
MCV: 84.4 fL (ref 80.0–100.0)
Platelets: 255 10*3/uL (ref 150–400)
RBC: 4.3 MIL/uL (ref 3.87–5.11)
RDW: 14 % (ref 11.5–15.5)
WBC: 10.6 10*3/uL — AB (ref 4.0–10.5)
nRBC: 0 % (ref 0.0–0.2)

## 2018-02-12 LAB — WET PREP, GENITAL
Clue Cells Wet Prep HPF POC: NONE SEEN
Trich, Wet Prep: NONE SEEN
Yeast Wet Prep HPF POC: NONE SEEN

## 2018-02-12 LAB — CHLAMYDIA/NGC RT PCR (ARMC ONLY)
Chlamydia Tr: NOT DETECTED
N gonorrhoeae: NOT DETECTED

## 2018-02-12 LAB — LIPASE, BLOOD: LIPASE: 45 U/L (ref 11–51)

## 2018-02-12 MED ORDER — HYDROMORPHONE HCL 1 MG/ML IJ SOLN
INTRAMUSCULAR | Status: AC
  Administered 2018-02-12: 0.5 mg via INTRAVENOUS
  Filled 2018-02-12: qty 1

## 2018-02-12 MED ORDER — HYDROMORPHONE HCL 1 MG/ML IJ SOLN
0.5000 mg | Freq: Once | INTRAMUSCULAR | Status: AC
  Administered 2018-02-12: 0.5 mg via INTRAVENOUS

## 2018-02-12 NOTE — Discharge Instructions (Addendum)
Please avoid sexual intercourse until you are cleared by your OB/GYN to resume having sex.  You may take Tylenol or Motrin if you develop pain.  Return to the emergency department if you develop severe pain, lightheadedness or fainting, fever, inability to keep down fluids, or any other symptoms concerning to you.
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
## 2018-02-12 NOTE — ED Triage Notes (Signed)
Pt presents to ED with severe back and abd pain during intercourse around 1700 this evening. Pt is 4 weeks Postpartum. Denies vaginal bleeding. Pt reports her pain is so severe she can hardly talk. Pt restless in triage. Appears very uncomfortable.

## 2018-02-12 NOTE — ED Notes (Signed)
FIRST NURSE NOTE: Pt arrived to ED via EMS reporting upper centralized abd pain and SOB after having sex with partner this evening. Pt is able to ambulate in lobby and talk in complete sentences. Pt is post partum x 4 weeks. No complications since birth per EMS.

## 2018-03-07 ENCOUNTER — Emergency Department
Admission: EM | Admit: 2018-03-07 | Discharge: 2018-03-08 | Disposition: A | Discharge status: Home / Self Care | Payer: Medicaid Other | Attending: Emergency Medicine | Admitting: Emergency Medicine

## 2018-03-07 ENCOUNTER — Other Ambulatory Visit: Payer: Self-pay

## 2018-03-07 DIAGNOSIS — F1410 Cocaine abuse, uncomplicated: Secondary | ICD-10-CM | POA: Insufficient documentation

## 2018-03-07 DIAGNOSIS — R110 Nausea: Secondary | ICD-10-CM | POA: Diagnosis not present

## 2018-03-07 DIAGNOSIS — Z87891 Personal history of nicotine dependence: Secondary | ICD-10-CM | POA: Diagnosis not present

## 2018-03-07 DIAGNOSIS — K8020 Calculus of gallbladder without cholecystitis without obstruction: Secondary | ICD-10-CM

## 2018-03-07 DIAGNOSIS — F1210 Cannabis abuse, uncomplicated: Secondary | ICD-10-CM | POA: Insufficient documentation

## 2018-03-07 DIAGNOSIS — R1011 Right upper quadrant pain: Secondary | ICD-10-CM | POA: Diagnosis present

## 2018-03-07 LAB — CBC
HCT: 35.3 % — ABNORMAL LOW (ref 36.0–46.0)
HEMOGLOBIN: 11.2 g/dL — AB (ref 12.0–15.0)
MCH: 27 pg (ref 26.0–34.0)
MCHC: 31.7 g/dL (ref 30.0–36.0)
MCV: 85.1 fL (ref 80.0–100.0)
Platelets: 269 10*3/uL (ref 150–400)
RBC: 4.15 MIL/uL (ref 3.87–5.11)
RDW: 14.4 % (ref 11.5–15.5)
WBC: 8.5 10*3/uL (ref 4.0–10.5)
nRBC: 0 % (ref 0.0–0.2)

## 2018-03-07 LAB — URINALYSIS, COMPLETE (UACMP) WITH MICROSCOPIC
BILIRUBIN URINE: NEGATIVE
Bacteria, UA: NONE SEEN
Glucose, UA: NEGATIVE mg/dL
KETONES UR: NEGATIVE mg/dL
Leukocytes, UA: NEGATIVE
Nitrite: NEGATIVE
Protein, ur: NEGATIVE mg/dL
Specific Gravity, Urine: 1.017 (ref 1.005–1.030)
WBC UA: NONE SEEN WBC/hpf (ref 0–5)
pH: 7 (ref 5.0–8.0)

## 2018-03-07 LAB — LIPASE, BLOOD: Lipase: 44 U/L (ref 11–51)

## 2018-03-07 LAB — COMPREHENSIVE METABOLIC PANEL
ALBUMIN: 4.2 g/dL (ref 3.5–5.0)
ALT: 11 U/L (ref 0–44)
ANION GAP: 7 (ref 5–15)
AST: 14 U/L — ABNORMAL LOW (ref 15–41)
Alkaline Phosphatase: 71 U/L (ref 38–126)
BILIRUBIN TOTAL: 0.3 mg/dL (ref 0.3–1.2)
BUN: 23 mg/dL — ABNORMAL HIGH (ref 6–20)
CALCIUM: 9.5 mg/dL (ref 8.9–10.3)
CO2: 26 mmol/L (ref 22–32)
Chloride: 106 mmol/L (ref 98–111)
Creatinine, Ser: 0.93 mg/dL (ref 0.44–1.00)
GLUCOSE: 96 mg/dL (ref 70–99)
POTASSIUM: 4.5 mmol/L (ref 3.5–5.1)
Sodium: 139 mmol/L (ref 135–145)
TOTAL PROTEIN: 7.5 g/dL (ref 6.5–8.1)

## 2018-03-07 LAB — POCT PREGNANCY, URINE: Preg Test, Ur: NEGATIVE

## 2018-03-07 MED ORDER — OXYCODONE-ACETAMINOPHEN 5-325 MG PO TABS
1.0000 | ORAL_TABLET | ORAL | Status: DC | PRN
  Administered 2018-03-07: 1 via ORAL
  Filled 2018-03-07: qty 1

## 2018-03-07 NOTE — ED Notes (Signed)
Patient to stat desk, states her pain is worse and is requesting more pain medications

## 2018-03-07 NOTE — ED Notes (Signed)
Spoke with Dr. Mayford Knife regarding patient, no further medications until seen by provider.

## 2018-03-07 NOTE — ED Triage Notes (Addendum)
Pt arrives [facility] from home for abd pain x 1 hour. No N&V. Hx of same a month ago. Unsure of dx. 130/76, HR 65, 100% RA with EMS.   Pt asking for pain medicine.   Started period today. Denies diarrhea or fever.   A&O, no distress noted. Ambulatory but wants to be in wheelchair.

## 2018-03-07 NOTE — ED Notes (Signed)
Pt keeps asking for pain medicine through out triage process. Pt ambulatory to toilet. Given pain medicine once urine sample was provided. Informed what pain medicine was and how long it would take affect. Pt given warm blanket.

## 2018-03-08 ENCOUNTER — Emergency Department: Payer: Medicaid Other

## 2018-03-08 MED ORDER — ONDANSETRON HCL 4 MG/2ML IJ SOLN
4.0000 mg | Freq: Once | INTRAMUSCULAR | Status: AC
  Administered 2018-03-08: 4 mg via INTRAVENOUS
  Filled 2018-03-08: qty 2

## 2018-03-08 MED ORDER — KETOROLAC TROMETHAMINE 10 MG PO TABS: 10.0000 mg | ORAL_TABLET | Freq: Four times a day (QID) | ORAL | 0 refills | Status: DC | PRN

## 2018-03-08 MED ORDER — MORPHINE SULFATE (PF) 4 MG/ML IV SOLN
4.0000 mg | Freq: Once | INTRAVENOUS | Status: AC
  Administered 2018-03-08: 4 mg via INTRAVENOUS
  Filled 2018-03-08: qty 1
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
## 2018-03-08 NOTE — ED Notes (Signed)
This RN back into room to start IV and give ordered meds. Charge RN Elon Jester in with this Char[employer] fundraiser. Pt apologized to this RN when nurse asked her if she was over being upset. Pt states she is and also states "I'm very sorry, I should not have yelled at you".

## 2018-03-08 NOTE — ED Notes (Signed)
Dr. Brown in to see pt.

## 2018-03-08 NOTE — ED Notes (Signed)
Pt to US.

## 2018-03-08 NOTE — ED Notes (Addendum)
This RN in room to assess pt. Pt immediately asking for pain medication. When this RN started asking questions about her symptoms pt became irate yelling at this RN that she needed "f*cking pain medication right now" and that I needed to go get her something. Explained the process to the patient and she started yelling that she did not wish for me to be her nurse. Explained to the patient that I was sorry that she was upset and that she was hurting but she needed to calm down and stop yelling so I could get the information the doctor needs to be able to treat her. Asked the patient what she had had in the past that had helped her pain and pt states "that shot they gave me the last time". When asked if she remembers what it was she states "I don't know but it starts with a D". Dr Manson Passey informed.

## 2018-12-20 ENCOUNTER — Ambulatory Visit: Payer: Self-pay

## 2019-01-04 ENCOUNTER — Encounter: Payer: Self-pay | Admitting: Emergency Medicine

## 2019-01-04 ENCOUNTER — Emergency Department
Admission: EM | Admit: 2019-01-04 | Discharge: 2019-01-04 | Disposition: A | Discharge status: Home / Self Care | Payer: Medicaid Other | Attending: Emergency Medicine | Admitting: Emergency Medicine

## 2019-01-04 ENCOUNTER — Other Ambulatory Visit: Payer: Self-pay

## 2019-01-04 DIAGNOSIS — Z5321 Procedure and treatment not carried out due to patient leaving prior to being seen by health care provider: Secondary | ICD-10-CM | POA: Insufficient documentation

## 2019-01-04 DIAGNOSIS — R1010 Upper abdominal pain, unspecified: Secondary | ICD-10-CM | POA: Insufficient documentation

## 2019-01-04 LAB — URINALYSIS, COMPLETE (UACMP) WITH MICROSCOPIC
Bacteria, UA: NONE SEEN
Bilirubin Urine: NEGATIVE
Glucose, UA: NEGATIVE mg/dL
Hgb urine dipstick: NEGATIVE
Ketones, ur: NEGATIVE mg/dL
Leukocytes,Ua: NEGATIVE
Nitrite: NEGATIVE
Protein, ur: NEGATIVE mg/dL
Specific Gravity, Urine: 1.015 (ref 1.005–1.030)
pH: 7 (ref 5.0–8.0)

## 2019-01-04 LAB — POCT PREGNANCY, URINE: Preg Test, Ur: NEGATIVE

## 2019-01-04 NOTE — ED Notes (Signed)
While performing protocols, pt st "I'm gonna walk him outside for a little bit"; informed pt that as soon as I complete her blood work and EKG she can go outside to wait but pt reports "she needs to go outside" to walk her son who is now crying wanting to see his dad; 1st nurse notified

## 2019-01-04 NOTE — ED Triage Notes (Signed)
Patient ambulatory to triage with steady gait, without difficulty or distress noted, mask in place; pt with son and st "we have been exposed to [place] and our stomachs hurt"; pt reports mid upper abd pain, denies any accomp symptoms

## 2019-01-06 ENCOUNTER — Ambulatory Visit: Payer: Self-pay

## 2019-04-12 ENCOUNTER — Ambulatory Visit: Payer: Self-pay

## 2019-04-12 ENCOUNTER — Other Ambulatory Visit: Payer: Self-pay | Admitting: Family Medicine

## 2019-04-12 DIAGNOSIS — Z3480 Encounter for supervision of other normal pregnancy, unspecified trimester: Secondary | ICD-10-CM

## 2019-04-13 ENCOUNTER — Emergency Department (HOSPITAL_COMMUNITY)
Admission: EM | Admit: 2019-04-13 | Discharge: 2019-04-13 | Disposition: A | Discharge status: Home / Self Care | Payer: Medicaid Other | Attending: Emergency Medicine | Admitting: Emergency Medicine

## 2019-04-13 ENCOUNTER — Other Ambulatory Visit: Payer: Self-pay

## 2019-04-13 ENCOUNTER — Encounter (HOSPITAL_COMMUNITY): Payer: Self-pay | Admitting: *Deleted

## 2019-04-13 DIAGNOSIS — Z3A00 Weeks of gestation of pregnancy not specified: Secondary | ICD-10-CM | POA: Insufficient documentation

## 2019-04-13 DIAGNOSIS — Z5321 Procedure and treatment not carried out due to patient leaving prior to being seen by health care provider: Secondary | ICD-10-CM | POA: Diagnosis not present

## 2019-04-13 DIAGNOSIS — O209 Hemorrhage in early pregnancy, unspecified: Secondary | ICD-10-CM | POA: Diagnosis not present

## 2019-04-13 NOTE — ED Triage Notes (Addendum)
Pt c/o dark red vaginal bleeding x 4 days. Pt reports she took a pregnancy test Sunday and found out she was pregnant unexpectantly. Pt reports using about 3 pads daily. Pt also c/o fatigue.

## 2019-04-14 ENCOUNTER — Encounter (HOSPITAL_COMMUNITY): Payer: Self-pay | Admitting: *Deleted

## 2019-04-14 ENCOUNTER — Other Ambulatory Visit: Payer: Self-pay

## 2019-04-14 ENCOUNTER — Emergency Department (HOSPITAL_COMMUNITY)
Admission: EM | Admit: 2019-04-14 | Discharge: 2019-04-14 | Disposition: A | Discharge status: Home / Self Care | Payer: Medicaid Other | Attending: Emergency Medicine | Admitting: Emergency Medicine

## 2019-04-14 ENCOUNTER — Emergency Department (HOSPITAL_COMMUNITY): Payer: Medicaid Other

## 2019-04-14 DIAGNOSIS — R5383 Other fatigue: Secondary | ICD-10-CM | POA: Insufficient documentation

## 2019-04-14 DIAGNOSIS — O209 Hemorrhage in early pregnancy, unspecified: Secondary | ICD-10-CM

## 2019-04-14 DIAGNOSIS — N939 Abnormal uterine and vaginal bleeding, unspecified: Secondary | ICD-10-CM | POA: Diagnosis not present

## 2019-04-14 DIAGNOSIS — R891 Abnormal level of hormones in specimens from other organs, systems and tissues: Secondary | ICD-10-CM | POA: Diagnosis not present

## 2019-04-14 DIAGNOSIS — Z20828 Contact with and (suspected) exposure to other viral communicable diseases: Secondary | ICD-10-CM | POA: Diagnosis not present

## 2019-04-14 DIAGNOSIS — F17210 Nicotine dependence, cigarettes, uncomplicated: Secondary | ICD-10-CM | POA: Diagnosis not present

## 2019-04-14 DIAGNOSIS — E349 Endocrine disorder, unspecified: Secondary | ICD-10-CM

## 2019-04-14 DIAGNOSIS — O039 Complete or unspecified spontaneous abortion without complication: Secondary | ICD-10-CM

## 2019-04-14 LAB — URINALYSIS, ROUTINE W REFLEX MICROSCOPIC
Bilirubin Urine: NEGATIVE
Glucose, UA: NEGATIVE mg/dL
Hgb urine dipstick: NEGATIVE
Ketones, ur: NEGATIVE mg/dL
Nitrite: NEGATIVE
Protein, ur: NEGATIVE mg/dL
Specific Gravity, Urine: 1.021 (ref 1.005–1.030)
pH: 6 (ref 5.0–8.0)

## 2019-04-14 LAB — WET PREP, GENITAL
Trich, Wet Prep: NONE SEEN
Yeast Wet Prep HPF POC: NONE SEEN

## 2019-04-14 LAB — CBC
HCT: 41.3 % (ref 36.0–46.0)
Hemoglobin: 12.8 g/dL (ref 12.0–15.0)
MCH: 26.6 pg (ref 26.0–34.0)
MCHC: 31 g/dL (ref 30.0–36.0)
MCV: 85.9 fL (ref 80.0–100.0)
Platelets: 220 10*3/uL (ref 150–400)
RBC: 4.81 MIL/uL (ref 3.87–5.11)
RDW: 14.7 % (ref 11.5–15.5)
WBC: 10 10*3/uL (ref 4.0–10.5)
nRBC: 0 % (ref 0.0–0.2)

## 2019-04-14 LAB — SAMPLE TO BLOOD BANK

## 2019-04-14 LAB — POC URINE PREG, ED: Preg Test, Ur: NEGATIVE

## 2019-04-14 LAB — HCG, QUANTITATIVE, PREGNANCY: hCG, Beta Chain, Quant, S: 380 m[IU]/mL — ABNORMAL HIGH (ref <5)

## 2019-04-14 LAB — SARS CORONAVIRUS 2 (TAT 6-24 HRS): SARS Coronavirus 2: NEGATIVE
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
## 2019-04-14 NOTE — ED Triage Notes (Signed)
Patient presents to the ED with complaints of vaginal bleeding for 5 days.  Patient has had a confirmed pregnancy at her PCP.  Patient does not know how far along she is.  Patient also states she feels she has covid symptoms, (weakness, cough, fever). Patient last temp 100.00 last night.

## 2019-04-14 NOTE — Discharge Instructions (Addendum)
Today you were evaluated in the ED for abdominal pain. Please be sure to follow up with Dr. Elonda Husky with OBGYN for further evaluation. Please return to the ED if you develop severe abdominal pain, nausea, vomiting, worsening bleeding, dizziness/lightheadedness. Thank you.  Your transvaginal ultrasound did not show any signs of live pregnancy. Your urine pregnancy test was negative but you did have an elevated hCG level. This indicates that you were likely pregnancy and had a miscarriage. It will be very important that you follow-up with Gynecology to make sure this level continues to decrease as expected.

## 2019-04-15 LAB — GC/CHLAMYDIA PROBE AMP (~~LOC~~) NOT AT ARMC
Chlamydia: NEGATIVE
Neisseria Gonorrhea: NEGATIVE

## 2019-04-18 ENCOUNTER — Ambulatory Visit (HOSPITAL_COMMUNITY): Admission: RE | Admit: 2019-04-18 | Payer: Medicaid Other | Source: Ambulatory Visit

## 2019-04-18 ENCOUNTER — Encounter (HOSPITAL_COMMUNITY): Payer: Self-pay

## 2019-04-25 ENCOUNTER — Emergency Department (HOSPITAL_COMMUNITY)
Admission: EM | Admit: 2019-04-25 | Discharge: 2019-04-25 | Disposition: A | Discharge status: Home / Self Care | Payer: Medicaid Other | Attending: Emergency Medicine | Admitting: Emergency Medicine

## 2019-04-25 ENCOUNTER — Encounter (HOSPITAL_COMMUNITY): Payer: Self-pay | Admitting: *Deleted

## 2019-04-25 ENCOUNTER — Other Ambulatory Visit: Payer: Self-pay

## 2019-04-25 DIAGNOSIS — Z3A00 Weeks of gestation of pregnancy not specified: Secondary | ICD-10-CM | POA: Insufficient documentation

## 2019-04-25 DIAGNOSIS — O4690 Antepartum hemorrhage, unspecified, unspecified trimester: Secondary | ICD-10-CM | POA: Diagnosis not present

## 2019-04-25 DIAGNOSIS — Z5320 Procedure and treatment not carried out because of patient's decision for unspecified reasons: Secondary | ICD-10-CM | POA: Insufficient documentation

## 2019-04-25 LAB — POC URINE PREG, ED: Preg Test, Ur: POSITIVE — AB

## 2019-04-25 NOTE — ED Triage Notes (Signed)
Patient states she is pregnant and does not know how far along she is.

## 2019-04-25 NOTE — ED Notes (Signed)
Pt not in waiting room when called at 2135

## 2019-04-25 NOTE — ED Notes (Signed)
Pt was called x 2, with no answer; did not inform registration she was leaving

## 2019-04-26 ENCOUNTER — Emergency Department (HOSPITAL_COMMUNITY): Payer: Medicaid Other

## 2019-04-26 ENCOUNTER — Other Ambulatory Visit: Payer: Self-pay

## 2019-04-26 ENCOUNTER — Emergency Department (HOSPITAL_COMMUNITY)
Admission: EM | Admit: 2019-04-26 | Discharge: 2019-04-26 | Disposition: A | Discharge status: Home / Self Care | Payer: Medicaid Other | Attending: Emergency Medicine | Admitting: Emergency Medicine

## 2019-04-26 ENCOUNTER — Encounter (HOSPITAL_COMMUNITY): Payer: Self-pay

## 2019-04-26 DIAGNOSIS — R102 Pelvic and perineal pain: Secondary | ICD-10-CM | POA: Insufficient documentation

## 2019-04-26 DIAGNOSIS — F17210 Nicotine dependence, cigarettes, uncomplicated: Secondary | ICD-10-CM | POA: Insufficient documentation

## 2019-04-26 DIAGNOSIS — Z3A01 Less than 8 weeks gestation of pregnancy: Secondary | ICD-10-CM | POA: Diagnosis not present

## 2019-04-26 DIAGNOSIS — O208 Other hemorrhage in early pregnancy: Secondary | ICD-10-CM | POA: Diagnosis not present

## 2019-04-26 DIAGNOSIS — O4690 Antepartum hemorrhage, unspecified, unspecified trimester: Secondary | ICD-10-CM

## 2019-04-26 DIAGNOSIS — Q899 Congenital malformation, unspecified: Secondary | ICD-10-CM

## 2019-04-26 LAB — COMPREHENSIVE METABOLIC PANEL
ALT: 11 U/L (ref 0–44)
AST: 13 U/L — ABNORMAL LOW (ref 15–41)
Albumin: 3.5 g/dL (ref 3.5–5.0)
Alkaline Phosphatase: 55 U/L (ref 38–126)
Anion gap: 9 (ref 5–15)
BUN: 13 mg/dL (ref 6–20)
CO2: 25 mmol/L (ref 22–32)
Calcium: 9 mg/dL (ref 8.9–10.3)
Chloride: 105 mmol/L (ref 98–111)
Creatinine, Ser: 0.93 mg/dL (ref 0.44–1.00)
GFR calc Af Amer: >60 mL/min (ref >60)
GFR calc non Af Amer: >60 mL/min (ref >60)
Glucose, Bld: 84 mg/dL (ref 70–99)
Potassium: 3.6 mmol/L (ref 3.5–5.1)
Sodium: 139 mmol/L (ref 135–145)
Total Bilirubin: 0.4 mg/dL (ref 0.3–1.2)
Total Protein: 6.2 g/dL — ABNORMAL LOW (ref 6.5–8.1)

## 2019-04-26 LAB — URINALYSIS, ROUTINE W REFLEX MICROSCOPIC
Bilirubin Urine: NEGATIVE
Glucose, UA: NEGATIVE mg/dL
Hgb urine dipstick: NEGATIVE
Ketones, ur: NEGATIVE mg/dL
Leukocytes,Ua: NEGATIVE
Nitrite: NEGATIVE
Protein, ur: NEGATIVE mg/dL
Specific Gravity, Urine: 1.017 (ref 1.005–1.030)
pH: 7 (ref 5.0–8.0)

## 2019-04-26 LAB — CBC
HCT: 36.1 % (ref 36.0–46.0)
Hemoglobin: 11.3 g/dL — ABNORMAL LOW (ref 12.0–15.0)
MCH: 27.1 pg (ref 26.0–34.0)
MCHC: 31.3 g/dL (ref 30.0–36.0)
MCV: 86.6 fL (ref 80.0–100.0)
Platelets: 277 10*3/uL (ref 150–400)
RBC: 4.17 MIL/uL (ref 3.87–5.11)
RDW: 15 % (ref 11.5–15.5)
WBC: 8.6 10*3/uL (ref 4.0–10.5)
nRBC: 0 % (ref 0.0–0.2)

## 2019-04-26 LAB — HCG, QUANTITATIVE, PREGNANCY: hCG, Beta Chain, Quant, S: 21003 m[IU]/mL — ABNORMAL HIGH (ref <5)

## 2019-04-26 LAB — WET PREP, GENITAL
Clue Cells Wet Prep HPF POC: NONE SEEN
Sperm: NONE SEEN
Trich, Wet Prep: NONE SEEN
Yeast Wet Prep HPF POC: NONE SEEN

## 2019-04-26 NOTE — Discharge Instructions (Signed)
You were seen in the emergency department today for vaginal bleeding and pelvic pain in pregnancy.  Your labs are reassuring.  Your hCG blood work is increasing.  Your ultrasound showed a gestational sac within your uterus, however there was no fetal pole, this means that she will need to have a repeat ultrasound in 14 days to further evaluate the pregnancy.  Please call your OB/GYN to schedule this and to follow-up.  Return to the emergency department for new or worsening symptoms including but not limited to increased pain, increased bleeding, fever, vomiting, or any other concerns.

## 2019-04-26 NOTE — ED Triage Notes (Signed)
Pt states she is pregnant and has vaginal bleeding. Has been seen here for same recently. Has had HCG levels drawn, but no ultrasound.
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
## 2019-04-27 LAB — GC/CHLAMYDIA PROBE AMP (~~LOC~~) NOT AT ARMC
Chlamydia: NEGATIVE
Neisseria Gonorrhea: NEGATIVE

## 2019-05-02 ENCOUNTER — Telehealth: Payer: Self-pay | Admitting: *Deleted

## 2019-05-02 NOTE — Telephone Encounter (Signed)
Pt would like to start her OB care here. Had pregnancy confirmed at [facility]. Please advise.

## 2019-05-06 NOTE — L&D Delivery Note (Signed)
Delivery Note  [name] Young is a [id number] at [redacted]w[redacted]d with an LMP of 03/20/2019, inconsistent with [place].   First Stage: Labor onset: 0600 Induction : Misoprostol and AROM Analgesia /Anesthesia intrapartum: Epidural AROM at 0906  Second Stage: Complete dilation at 0910 Onset of pushing at 0917 FHR second stage 140 bpm, moderate variability with variables and prolong decel immediately before birth   [name] Young presented to L&D for scheduled IOL d/t IUGR.  Her labor progressed well after 2 doses of misoprostol.  She received an epidural for pain management.  AROM was performed and changed to C/C/+3 precipitously.  [name] Young pushed effectively for delivery of viable baby girl.   Delivery of a viable girl 12/08/2019 at 0924 by CNM Delivery of fetal head in OA position with restitution to ROT. No nuchal cord;  Anterior then posterior shoulders delivered easily with gentle downward traction. Baby placed on mom's chest, and attended to by baby nurse. Cord double clamped after cessation of pulsation, cut by father of baby.   Cord blood sample collected - O pos   Third Stage: Oxytocin bolus started after delivery of infant for hemorrhage prophylaxis  Methergine 0.2mg  given IM after cord clamp for hemorrhage prophylaxis Placenta delivered intact with 3 VC @ 0928 Placenta disposition: to lab Uterine tone firm / bleeding moderate  No laceration identified  Anesthesia for repair: N/A Repair: N/A Est. Blood Loss (mL): 100 ml   Complications: None  Mom to postpartum.  Baby to Couplet care / Skin to Skin.  Newborn: Birth Weight: 5lbs 7.5oz [phone])  Apgar Scores: 8/9 Feeding planned: formula   ---------- [name] Young, CNM Certified Nurse Midwife [facility]  Clinic OB[facility]N [facility]

## 2019-05-11 ENCOUNTER — Other Ambulatory Visit: Payer: Self-pay | Admitting: Obstetrics & Gynecology

## 2019-05-11 DIAGNOSIS — O3680X0 Pregnancy with inconclusive fetal viability, not applicable or unspecified: Secondary | ICD-10-CM

## 2019-05-12 ENCOUNTER — Other Ambulatory Visit: Payer: Self-pay

## 2019-05-13 LAB — OB RESULTS CONSOLE HIV ANTIBODY (ROUTINE TESTING): HIV: NONREACTIVE

## 2019-05-13 LAB — OB RESULTS CONSOLE HEPATITIS B SURFACE ANTIGEN: Hepatitis B Surface Ag: NEGATIVE

## 2019-05-13 LAB — OB RESULTS CONSOLE VARICELLA ZOSTER ANTIBODY, IGG: Varicella: IMMUNE

## 2019-05-13 LAB — OB RESULTS CONSOLE RUBELLA ANTIBODY, IGM: Rubella: IMMUNE

## 2019-05-18 ENCOUNTER — Other Ambulatory Visit: Payer: Self-pay | Admitting: Family Medicine

## 2019-05-18 DIAGNOSIS — Z3481 Encounter for supervision of other normal pregnancy, first trimester: Secondary | ICD-10-CM

## 2019-05-20 ENCOUNTER — Other Ambulatory Visit: Payer: Self-pay

## 2019-05-20 DIAGNOSIS — Z5321 Procedure and treatment not carried out due to patient leaving prior to being seen by health care provider: Secondary | ICD-10-CM | POA: Insufficient documentation

## 2019-05-20 DIAGNOSIS — R0789 Other chest pain: Secondary | ICD-10-CM | POA: Insufficient documentation

## 2019-05-20 DIAGNOSIS — F1410 Cocaine abuse, uncomplicated: Secondary | ICD-10-CM | POA: Diagnosis not present

## 2019-05-20 LAB — CBC
HCT: 31.8 % — ABNORMAL LOW (ref 36.0–46.0)
Hemoglobin: 10.5 g/dL — ABNORMAL LOW (ref 12.0–15.0)
MCH: 27.3 pg (ref 26.0–34.0)
MCHC: 33 g/dL (ref 30.0–36.0)
MCV: 82.8 fL (ref 80.0–100.0)
Platelets: 236 10*3/uL (ref 150–400)
RBC: 3.84 MIL/uL — ABNORMAL LOW (ref 3.87–5.11)
RDW: 13.9 % (ref 11.5–15.5)
WBC: 9.4 10*3/uL (ref 4.0–10.5)
nRBC: 0 % (ref 0.0–0.2)

## 2019-05-20 LAB — BASIC METABOLIC PANEL
Anion gap: 10 (ref 5–15)
BUN: 10 mg/dL (ref 6–20)
CO2: 22 mmol/L (ref 22–32)
Calcium: 9.2 mg/dL (ref 8.9–10.3)
Chloride: 105 mmol/L (ref 98–111)
Creatinine, Ser: 0.77 mg/dL (ref 0.44–1.00)
GFR calc Af Amer: >60 mL/min (ref >60)
GFR calc non Af Amer: >60 mL/min (ref >60)
Glucose, Bld: 93 mg/dL (ref 70–99)
Potassium: 3.8 mmol/L (ref 3.5–5.1)
Sodium: 137 mmol/L (ref 135–145)

## 2019-05-20 LAB — TROPONIN I (HIGH SENSITIVITY): Troponin I (High Sensitivity): <2 ng/L (ref <18)

## 2019-05-20 NOTE — ED Notes (Signed)
Patient to stat desk in no acute distress asking about wait time. Patient given update on wait time. Patient verbalizes understanding.  

## 2019-05-20 NOTE — ED Triage Notes (Signed)
Pt to the er for chest pain r/t cocaine use last night. Pt states she did not start having chest pain till last night until after she used cocaine that she snorted. Pt is also [redacted] weeks pregnant. Pt says the pain has gotten worse through the day.Pt reports pain to the middle upper chest. Pt reports cold sweats as well.

## 2019-05-21 ENCOUNTER — Emergency Department
Admission: EM | Admit: 2019-05-21 | Discharge: 2019-05-21 | Disposition: A | Discharge status: Home / Self Care | Payer: Medicaid Other | Attending: Emergency Medicine | Admitting: Emergency Medicine

## 2019-05-21 HISTORY — DX: Other psychoactive substance abuse, uncomplicated: F19.10

## 2019-05-21 NOTE — ED Notes (Signed)
Patient called for a repeat blood draw with no answer.

## 2019-05-23 ENCOUNTER — Telehealth: Payer: Self-pay | Admitting: Emergency Medicine

## 2019-05-23 NOTE — Telephone Encounter (Signed)
Called patient due to lwot to inquire about condition and follow up plans. She says she feels better.  Advised her to let her doctor know about the chest pain.

## 2019-06-01 ENCOUNTER — Other Ambulatory Visit: Payer: Self-pay | Admitting: Family Medicine

## 2019-06-01 ENCOUNTER — Other Ambulatory Visit: Payer: Self-pay

## 2019-06-01 ENCOUNTER — Ambulatory Visit
Admission: RE | Admit: 2019-06-01 | Discharge: 2019-06-01 | Disposition: A | Discharge status: Home / Self Care | Payer: Medicaid Other | Source: Ambulatory Visit | Attending: Family Medicine | Admitting: Family Medicine

## 2019-06-01 DIAGNOSIS — Z3481 Encounter for supervision of other normal pregnancy, first trimester: Secondary | ICD-10-CM | POA: Insufficient documentation

## 2019-06-16 ENCOUNTER — Other Ambulatory Visit: Payer: Self-pay

## 2019-06-16 ENCOUNTER — Ambulatory Visit
Admission: RE | Admit: 2019-06-16 | Discharge: 2019-06-16 | Disposition: A | Discharge status: Home / Self Care | Payer: Medicaid Other | Source: Ambulatory Visit | Attending: Obstetrics and Gynecology | Admitting: Obstetrics and Gynecology

## 2019-06-16 ENCOUNTER — Other Ambulatory Visit: Payer: Medicaid Other

## 2019-06-16 DIAGNOSIS — Z36 Encounter for antenatal screening for chromosomal anomalies: Secondary | ICD-10-CM | POA: Diagnosis not present

## 2019-06-16 NOTE — Progress Notes (Signed)
Virtual Visit via Telephone Note  I connected with [name] Young on @TODAY @ at 10:00 AM EST by telephone and verified that I am speaking with the correct person using two identifiers.  Referring physician:  Length of consultation:  30 minutes  Ms. Goodwyn was referred to [facility] [place] for genetic counseling to review prenatal screening and testing options.  This note summarizes the information we discussed.    We offered the following routine screening tests for this pregnancy:  The most accurate screening option for chromosome conditions is cell free fetal DNA testing.  Though this is typically reserved for pregnancies at increased risk for aneuploidy, it is currently being made available and many insurance companies are adding coverage for this testing in low risk patients during COVID.  This test utilizes a maternal blood sample and DNA sequencing technology to isolate circulating cell free fetal DNA from maternal plasma.  The fetal DNA can then be analyzed for DNA sequences that are derived from the three most common chromosomes involved in aneuploidy, chromosomes 13, 18, and 21.  If the overall amount of DNA is greater than the expected level for any of these chromosomes, aneuploidy is suspected.  The detection rates are >99% for Down syndrome, >98% for trisomy 18 and >91% for Trisomy 13.  While we do not consider it a replacement for invasive testing and karyotype analysis, a negative result from this testing would be reassuring, though not a guarantee of a normal chromosome complement for the baby.  An abnormal result may be suggestive of an abnormal chromosome complement, though we would still recommend CVS or amniocentesis to confirm any findings from this testing.  First trimester screening, which includes nuchal translucency ultrasound screen and first trimester maternal serum marker screening, is the test that has most recently been  available for low risk patients.  The nuchal translucency has approximately an 80% detection rate for Down syndrome and can be positive for other chromosome abnormalities as well as congenital heart defects.  When combined with a maternal serum marker screening, the detection rate is up to 90% for Down syndrome and up to 97% for trisomy 18.   Given current recommendations during COVID, we are offering only the biochemical testing portion of this testing (without the ultrasound and NT portion), which has a much lower detection rate.  Maternal serum marker screening, or "quad" screen, is a blood test that measures pregnancy proteins, can provide risk assessments for Down syndrome, trisomy 18, and open neural tube defects (spina bifida, anencephaly). Because it does not directly examine the fetus, it cannot positively diagnose or rule out these problems. This is a second trimester option which could be offered along with the anatomy ultrasound. It can detect approximately 75% of babies with Down syndrome, 80% of babies with open spina bifida and 70% of babies with trisomy 36.  Targeted ultrasound uses high frequency sound waves to create an image of the developing fetus.  An ultrasound is often recommended as a routine means of evaluating the pregnancy.  It is also used to screen for fetal anatomy problems (for example, a heart defect) that might be suggestive of a chromosomal or other abnormality. We are currently not recommending a first trimester ultrasound other than that which would be ordered for dating and viability.  Should these screening tests indicate an increased concern, then the following additional testing options would be offered:  The chorionic villus sampling procedure is available for first trimester  chromosome analysis.  This involves the withdrawal of a small amount of chorionic villi (tissue from the developing placenta).  Risk of pregnancy loss is estimated to be approximately 1 in 200 to  1 in 100 (0.5 to 1%).  There is approximately a 1% (1 in 100) chance that the CVS chromosome results will be unclear.  Chorionic villi cannot be tested for neural tube defects.     Amniocentesis involves the removal of a small amount of amniotic fluid from the sac surrounding the fetus with the use of a thin needle inserted through the maternal abdomen and uterus.  Ultrasound guidance is used throughout the procedure.  Fetal cells from amniotic fluid are directly evaluated and > 99.5% of chromosome problems and > 98% of open neural tube defects can be detected. This procedure is generally performed after the 15th week of pregnancy.  The main risks to this procedure include complications leading to miscarriage in less than 1 in 200 cases (0.5%).  Cystic Fibrosis and Spinal Muscular Atrophy (SMA) screening were also discussed with the patient. Both conditions are recessive, which means that both parents must be carriers in order to have a child with the disease.  Cystic fibrosis (CF) is one of the most common genetic conditions in persons of Caucasian ancestry.  This condition occurs in approximately 1 in 2,500 Caucasian persons and results in thickened secretions in the lungs, digestive, and reproductive systems.  For a baby to be at risk for having CF, both of the parents must be carriers for this condition.  Approximately 1 in 57 Caucasian persons is a carrier for CF.  Current carrier testing looks for the most common mutations in the gene for CF and can detect approximately 90% of carriers in the Caucasian population.  This means that the carrier screening can greatly reduce, but cannot eliminate, the chance for an individual to have a child with CF.  If an individual is found to be a carrier for CF, then carrier testing would be available for the partner. As part of [place] newborn screening profile, all babies born in the state of [place] will have a two-tier screening process.  Specimens are  first tested to determine the concentration of immunoreactive trypsinogen (IRT).  The top 5% of specimens with the highest IRT values then undergo DNA testing using a panel of over 40 common CF mutations. SMA is a neurodegenerative disorder that leads to atrophy of skeletal muscle and overall weakness.  This condition is also more prevalent in the Caucasian population, with 1 in 40-1 in 60 persons being a carrier and 1 in 6,000-1 in 10,000 children being affected.  There are multiple forms of the disease, with some causing death in infancy to other forms with survival into adulthood.  The genetics of SMA is complex, but carrier screening can detect up to 95% of carriers in the Caucasian population.  Similar to CF, a negative result can greatly reduce, but cannot eliminate, the chance to have a child with SMA. The patient declined carrier screening for CF and SMA.  We talked about the option of signing up for Early Check to have the baby tested for SMA after delivery as part of a new study in [place].  This registration can be done online prior to delivery if desired. We also offered hemoglobinopathy screening, as prior testing used only a sickle cell "screen" and did not utilize a full hemoglobinopathy profile which is more complete for sickle cell as well as other  hemoglobin variants.  The patient and her partner are both of African American ancestry.  We obtained a detailed family history and pregnancy history.  The family history was reported to be unremarkable for birth defects, intellectual delays, recurrent pregnancy loss or known chromosome abnormalities.  This is the eighth pregnancy for Ms. Vanover, the fourth with her current partner, Chanetta Marshall.  They have three healthy sons together, ages 72, 2 and 1.  Chanetta Marshall has a 20 year old son who is in good health.  Ms. Habermann has a 71 year old son and two daughters ages 17 and 8 years from prior relationships.  All are in good health.  She reported no complications or  exposures in this pregnancy to medications, alcohol or recreational drugs.  She reported that she has stopped smoking cigarettes.  As we discussed, smoking during pregnancy has been associated with low birth weight, premature delivery and pregnancy loss.  For this reason, we suggest that she continue to avoid smoking for the remainder of the pregnancy.  After consideration of the options, Ms.Augustus elected to have MaterniT21 PLUS with [facility] as well as carrier screening for CF, SMA and hemoglobinopaties drawn at a [facility].  This will minimize the number of visits that she needs to attend in person.    The patient was encouraged to call with questions or concerns.  We can be contacted at [phone].  Labs ordered: MaterniT21 PLUS with [facility], Inheritest CF/SMA, hemoglobinopathy profile  Cherly Anderson, MS, CGC  I provided 30 minutes of non-face-to-face time during this encounter.   Katrina Stack

## 2019-06-23 ENCOUNTER — Telehealth: Payer: Self-pay | Admitting: Obstetrics and Gynecology

## 2019-06-23 NOTE — Telephone Encounter (Signed)
The patient was informed of the results of her recent MaterniT21 testing which yielded NEGATIVE results.  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  The sensitivity for trisomy 53, trisomy 28 and trisomy 49 using this testing are reported as 99.1%, 99.9% and 91.7% respectively.  Thus, while the results of this testing are highly accurate, they are not considered diagnostic at this time.  Should more definitive information be desired, the patient may still consider amniocentesis.   As requested to know by the patient, sex chromosome analysis was included for this sample.  Results are consistent with a female fetus. This is predicted with >99% accuracy.  A maternal serum AFP only should be considered if screening for neural tube defects is desired.  [name] Young also elected to have carrier screening for hemoglobinopathies, CF and SMA.  Hemoglobinopathy screening results showed normal adult hemoglobin (AA), indicating that she is not a carrier for sickle cell or other hemoglobin variant.  CF and SMA are pending.  We may be reached at [phone] with any questions or concerns.   Cherly Anderson, MS, CGC

## 2019-06-30 ENCOUNTER — Telehealth: Payer: Self-pay | Admitting: Obstetrics and Gynecology

## 2019-06-30 NOTE — Telephone Encounter (Signed)
[name] Young was informed of the results of her Inheritest Cystic fibrosis (CF) and Spinal Muscular Atrophy (SMA) carrier screening, which were negative.  To review, CF is a genetic condition that occurs most often in Caucasian persons, but may occur in persons of any ethnic background.  It primarily affects the lungs, digestive, and reproductive systems.  For someone to be at risk for having CF, both of their parents must be carriers for CF.  The testing can detect many persons who are carriers for CF and therefore determine if the pregnancy is at an increased risk for this condition.  The blood test results were negative when examined for the 97 most common mutations (or changes) in the gene for CF.  This means that this patient does not carry any of the most common changes in this gene.  Testing for these detects approximately 81% of carriers who are African American.  Therefore, the chance that she is a carrier based on this negative result has been reduced from 1 in 61 to approximately 1 in 316.  Because this testing cannot detect all changes that may cause CF, we cannot eliminate the chance that this individual is a carrier completely.  The results of the SMA carrier screening are also available.  SMA is also a recessive genetic condition with variable age of onset and severity caused by mutations in the SMN1 gene.  This carrier testing assesses the number of copies of this gene.  Persons with one copy of the SMN1 gene are carriers, and those with no copies are affected with the condition.  Individuals with two or more copies have a reduced chance to be a carrier.  Not all mutations can be detected with this testing, though it can detect 90.3% of carriers in the African [place] population.  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 *3+80T>G SNP, thus reducing her chance to be a carrier from 1 in 72 to 1 in 375.  Again, this testing cannot  eliminate the chance to have a child with SMA, but dramatically reduces the chance.    We encouraged the patient to call with any questions or concerns as they arise.  We may be reached at [phone].  Cherly Anderson, MS, CGC

## 2019-07-11 ENCOUNTER — Other Ambulatory Visit: Payer: Self-pay

## 2019-07-18 ENCOUNTER — Other Ambulatory Visit: Payer: Self-pay | Admitting: Maternal & Fetal Medicine

## 2019-07-18 DIAGNOSIS — O0992 Supervision of high risk pregnancy, unspecified, second trimester: Secondary | ICD-10-CM

## 2019-07-21 ENCOUNTER — Inpatient Hospital Stay: Admission: RE | Admit: 2019-07-21 | Payer: Medicaid Other | Source: Ambulatory Visit

## 2019-07-27 ENCOUNTER — Emergency Department
Admission: EM | Admit: 2019-07-27 | Discharge: 2019-07-27 | Disposition: A | Discharge status: Home / Self Care | Payer: Medicaid Other | Attending: Emergency Medicine | Admitting: Emergency Medicine

## 2019-07-27 ENCOUNTER — Other Ambulatory Visit: Payer: Self-pay

## 2019-07-27 ENCOUNTER — Encounter: Payer: Self-pay | Admitting: Emergency Medicine

## 2019-07-27 ENCOUNTER — Emergency Department: Payer: Medicaid Other

## 2019-07-27 DIAGNOSIS — Z3A19 19 weeks gestation of pregnancy: Secondary | ICD-10-CM | POA: Insufficient documentation

## 2019-07-27 DIAGNOSIS — O368121 Decreased fetal movements, second trimester, fetus 1: Secondary | ICD-10-CM | POA: Insufficient documentation

## 2019-07-27 DIAGNOSIS — Z5321 Procedure and treatment not carried out due to patient leaving prior to being seen by health care provider: Secondary | ICD-10-CM | POA: Insufficient documentation

## 2019-07-27 LAB — CBC
HCT: 30.4 % — ABNORMAL LOW (ref 36.0–46.0)
Hemoglobin: 10 g/dL — ABNORMAL LOW (ref 12.0–15.0)
MCH: 27.9 pg (ref 26.0–34.0)
MCHC: 32.9 g/dL (ref 30.0–36.0)
MCV: 84.7 fL (ref 80.0–100.0)
Platelets: 198 10*3/uL (ref 150–400)
RBC: 3.59 MIL/uL — ABNORMAL LOW (ref 3.87–5.11)
RDW: 14 % (ref 11.5–15.5)
WBC: 8.9 10*3/uL (ref 4.0–10.5)
nRBC: 0 % (ref 0.0–0.2)

## 2019-07-27 LAB — COMPREHENSIVE METABOLIC PANEL
ALT: 20 U/L (ref 0–44)
AST: 14 U/L — ABNORMAL LOW (ref 15–41)
Albumin: 3.6 g/dL (ref 3.5–5.0)
Alkaline Phosphatase: 54 U/L (ref 38–126)
Anion gap: 7 (ref 5–15)
BUN: 10 mg/dL (ref 6–20)
CO2: 23 mmol/L (ref 22–32)
Calcium: 8.8 mg/dL — ABNORMAL LOW (ref 8.9–10.3)
Chloride: 108 mmol/L (ref 98–111)
Creatinine, Ser: 0.58 mg/dL (ref 0.44–1.00)
GFR calc Af Amer: >60 mL/min (ref >60)
GFR calc non Af Amer: >60 mL/min (ref >60)
Glucose, Bld: 84 mg/dL (ref 70–99)
Potassium: 3.7 mmol/L (ref 3.5–5.1)
Sodium: 138 mmol/L (ref 135–145)
Total Bilirubin: 0.3 mg/dL (ref 0.3–1.2)
Total Protein: 7.2 g/dL (ref 6.5–8.1)

## 2019-07-27 LAB — POCT PREGNANCY, URINE: Preg Test, Ur: POSITIVE — AB

## 2019-07-27 LAB — HCG, QUANTITATIVE, PREGNANCY: hCG, Beta Chain, Quant, S: 8159 m[IU]/mL — ABNORMAL HIGH (ref <5)

## 2019-07-27 NOTE — ED Notes (Signed)
POCT UA PREG results = POS

## 2019-07-27 NOTE — ED Notes (Signed)
Patient was brought back from [place] and registration staff visualized patient walking out of the ED and going to her car.

## 2019-07-27 NOTE — ED Triage Notes (Signed)
Patient is [redacted] weeks pregnant and states she has not yet felt the baby move.  Patient's provider at [facility] instructed patient to come to the hospital to get "checked out".  Patient is in no obvious distress at this time.  Patient states this is her 6th pregnancy.

## 2019-07-28 ENCOUNTER — Telehealth: Payer: Self-pay | Admitting: Emergency Medicine

## 2019-07-28 NOTE — Telephone Encounter (Signed)
Called patient due to lwot to inquire about condition and follow up plans. Left message.   

## 2019-08-17 ENCOUNTER — Other Ambulatory Visit: Payer: Self-pay

## 2019-08-17 ENCOUNTER — Emergency Department
Admission: EM | Admit: 2019-08-17 | Discharge: 2019-08-17 | Discharge status: Against Medical Advice | Payer: Medicaid Other | Attending: Emergency Medicine | Admitting: Emergency Medicine

## 2019-08-17 DIAGNOSIS — R079 Chest pain, unspecified: Secondary | ICD-10-CM | POA: Insufficient documentation

## 2019-08-17 DIAGNOSIS — Z5321 Procedure and treatment not carried out due to patient leaving prior to being seen by health care provider: Secondary | ICD-10-CM | POA: Insufficient documentation

## 2019-08-17 NOTE — ED Notes (Signed)
Called in the waiting room with no answer. °

## 2019-08-17 NOTE — ED Triage Notes (Signed)
Pt comes POV with anxiety, CP, SOB for a week. Pt is [redacted] weeks pregnant. Pt reports have panic attacks at work, not being able to finish a shift.

## 2019-08-17 NOTE — ED Notes (Signed)
Pt does not want blood work. Pt agrees to an EKG. Pt wants to be seen only for anxiety.

## 2019-09-12 ENCOUNTER — Other Ambulatory Visit: Payer: Self-pay

## 2019-09-12 ENCOUNTER — Ambulatory Visit
Admission: RE | Admit: 2019-09-12 | Discharge: 2019-09-12 | Disposition: A | Discharge status: Home / Self Care | Payer: Medicaid Other | Source: Ambulatory Visit | Attending: Maternal & Fetal Medicine | Admitting: Maternal & Fetal Medicine

## 2019-09-12 DIAGNOSIS — F17210 Nicotine dependence, cigarettes, uncomplicated: Secondary | ICD-10-CM | POA: Insufficient documentation

## 2019-09-12 DIAGNOSIS — O0992 Supervision of high risk pregnancy, unspecified, second trimester: Secondary | ICD-10-CM | POA: Diagnosis not present

## 2019-09-12 DIAGNOSIS — Z3A25 25 weeks gestation of pregnancy: Secondary | ICD-10-CM | POA: Diagnosis not present

## 2019-09-12 DIAGNOSIS — O99332 Smoking (tobacco) complicating pregnancy, second trimester: Secondary | ICD-10-CM | POA: Insufficient documentation

## 2019-09-12 DIAGNOSIS — Z3689 Encounter for other specified antenatal screening: Secondary | ICD-10-CM | POA: Insufficient documentation

## 2019-09-12 DIAGNOSIS — O321XX0 Maternal care for breech presentation, not applicable or unspecified: Secondary | ICD-10-CM | POA: Diagnosis not present

## 2019-10-06 ENCOUNTER — Other Ambulatory Visit: Payer: Self-pay | Admitting: Maternal & Fetal Medicine

## 2019-10-06 DIAGNOSIS — O99333 Smoking (tobacco) complicating pregnancy, third trimester: Secondary | ICD-10-CM

## 2019-10-10 ENCOUNTER — Ambulatory Visit
Admission: RE | Admit: 2019-10-10 | Discharge: 2019-10-10 | Disposition: A | Discharge status: Home / Self Care | Payer: Medicaid Other | Source: Ambulatory Visit | Attending: Maternal & Fetal Medicine | Admitting: Maternal & Fetal Medicine

## 2019-10-10 ENCOUNTER — Other Ambulatory Visit: Payer: Self-pay

## 2019-10-10 DIAGNOSIS — O321XX0 Maternal care for breech presentation, not applicable or unspecified: Secondary | ICD-10-CM | POA: Insufficient documentation

## 2019-10-10 DIAGNOSIS — O99333 Smoking (tobacco) complicating pregnancy, third trimester: Secondary | ICD-10-CM | POA: Diagnosis not present

## 2019-10-10 DIAGNOSIS — Z3A29 29 weeks gestation of pregnancy: Secondary | ICD-10-CM | POA: Insufficient documentation

## 2019-10-10 DIAGNOSIS — F17210 Nicotine dependence, cigarettes, uncomplicated: Secondary | ICD-10-CM | POA: Diagnosis not present

## 2019-11-03 ENCOUNTER — Other Ambulatory Visit: Payer: Self-pay | Admitting: Family Medicine

## 2019-11-03 DIAGNOSIS — O99333 Smoking (tobacco) complicating pregnancy, third trimester: Secondary | ICD-10-CM

## 2019-11-10 ENCOUNTER — Inpatient Hospital Stay: Admission: RE | Admit: 2019-11-10 | Payer: Medicaid Other | Source: Ambulatory Visit

## 2019-11-24 ENCOUNTER — Ambulatory Visit: Payer: Medicaid Other

## 2019-11-24 NOTE — Progress Notes (Deleted)
Pt was a "No Show" for her appt today at [facility] @ [facility].

## 2019-11-30 LAB — OB RESULTS CONSOLE GBS: GBS: NEGATIVE

## 2019-11-30 LAB — OB RESULTS CONSOLE GC/CHLAMYDIA
Chlamydia: NEGATIVE
Gonorrhea: NEGATIVE

## 2019-12-01 ENCOUNTER — Other Ambulatory Visit: Payer: Self-pay

## 2019-12-01 ENCOUNTER — Ambulatory Visit: Payer: Medicaid Other | Attending: Maternal & Fetal Medicine

## 2019-12-01 ENCOUNTER — Other Ambulatory Visit: Payer: Self-pay | Admitting: Family Medicine

## 2019-12-01 DIAGNOSIS — O365930 Maternal care for other known or suspected poor fetal growth, third trimester, not applicable or unspecified: Secondary | ICD-10-CM | POA: Diagnosis not present

## 2019-12-01 DIAGNOSIS — Z3A37 37 weeks gestation of pregnancy: Secondary | ICD-10-CM

## 2019-12-01 DIAGNOSIS — Z720 Tobacco use: Secondary | ICD-10-CM

## 2019-12-01 DIAGNOSIS — F17200 Nicotine dependence, unspecified, uncomplicated: Secondary | ICD-10-CM | POA: Diagnosis not present

## 2019-12-01 DIAGNOSIS — O99333 Smoking (tobacco) complicating pregnancy, third trimester: Secondary | ICD-10-CM | POA: Diagnosis not present

## 2019-12-01 DIAGNOSIS — O3680X0 Pregnancy with inconclusive fetal viability, not applicable or unspecified: Secondary | ICD-10-CM

## 2019-12-02 ENCOUNTER — Observation Stay
Admission: RE | Admit: 2019-12-02 | Discharge: 2019-12-02 | Disposition: A | Discharge status: Home / Self Care | Payer: Medicaid Other | Attending: Obstetrics and Gynecology | Admitting: Obstetrics and Gynecology

## 2019-12-02 DIAGNOSIS — O365931 Maternal care for other known or suspected poor fetal growth, third trimester, fetus 1: Secondary | ICD-10-CM | POA: Diagnosis present

## 2019-12-02 DIAGNOSIS — O26893 Other specified pregnancy related conditions, third trimester: Secondary | ICD-10-CM | POA: Diagnosis not present

## 2019-12-02 DIAGNOSIS — Z3A38 38 weeks gestation of pregnancy: Secondary | ICD-10-CM | POA: Insufficient documentation

## 2019-12-02 NOTE — OB Triage Note (Signed)
Provider reviewed Fetal heart tracing and verbal order given to D/C monitoring and discharge patient home. Pt is scheduled to return 12/08/19 at 0001 for IOL. Pt verbalized understanding of instructions and instructed when to return to the hospital for evaluation. Pt given fetal kick count sheet and verbalized understanding of education. Pt has no further questions at this time and discharged home in stable condition.

## 2019-12-02 NOTE — OB Triage Note (Signed)
Patient sent for NST, plan of care is to induce at 38-39 weeks.

## 2019-12-02 NOTE — Discharge Summary (Signed)
Patient ID: [name] Young MRN: [id number] DOB/AGE: [birth year] 34 y.o.  Admit date: 12/02/2019 Discharge date: 12/02/19  Admission Diagnoses: Sent from CD for NST  Discharge Diagnoses: Reactive NST  Prenatal Procedures: NST  Consults: None  Significant Diagnostic Studies:  No results found for this or any previous visit (from the past 168 hour(s)).  Treatments: none  Hospital Course:  This is a 34 y.o. [id number] with IUP at [redacted]w[redacted]d seen for NST. NST: FHR baseline: 140 bpm Variability: moderate Accelerations: yes Decelerations: none Time: 70 minutes Category/reactivity: reactive  TOCO: irregular   Discharge Physical Exam:  BP 93/70   Pulse 80   Temp 98.7 F (37.1 C) (Oral)   Resp 18   LMP 03/20/2019   General: NAD CV: RRR Pulm: CTABL, nl effort ABD: s/nd/nt, gravid DVT Evaluation: LE non-ttp, no evidence of DVT on exam.   Discharge Condition: Stable  Disposition: Discharge disposition: 01-Home or Self Care        Allergies as of 12/02/2019   No Known Allergies     Medication List    TAKE these medications   prenatal multivitamin Tabs tablet Take 1 tablet by mouth daily at 12 noon.       Follow-up Information    [facility]. Go to.   Specialty: Obstetrics and Gynecology Why: Arrive in through the ED. Check in after midnight on 12/08/19 for scheduled IOL Contact information: [address] [id number] [place] [address] [phone]              Signed:  Quillian Quince 12/06/2019 11:53 PM

## 2019-12-05 ENCOUNTER — Other Ambulatory Visit: Payer: Self-pay | Admitting: Emergency Medicine

## 2019-12-05 ENCOUNTER — Ambulatory Visit: Payer: Medicaid Other

## 2019-12-05 DIAGNOSIS — O365930 Maternal care for other known or suspected poor fetal growth, third trimester, not applicable or unspecified: Secondary | ICD-10-CM

## 2019-12-05 DIAGNOSIS — O99333 Smoking (tobacco) complicating pregnancy, third trimester: Secondary | ICD-10-CM

## 2019-12-06 ENCOUNTER — Other Ambulatory Visit: Payer: Self-pay | Admitting: Obstetrics and Gynecology

## 2019-12-06 NOTE — Progress Notes (Signed)
History of Present Illness:  Chief Complaint:   HPI:  [name] Young is a 34 y.o. [phone] female at [redacted]w[redacted]d dated by 5 week u/s.  She will present to L&D for  induction of labor for fetal growth restriction at 4.7%.  Pregnancy Issues: 1. Fetal Growth Restriction 2. Anemia   Prenatal care site: [facility]     Pertinent Results:  Prenatal Labs: Blood type/Rh O pos  Antibody screen neg  Rubella Immune  Varicella Immune  RPR NR  HBsAg Neg  HIV NR  GC neg  Chlamydia neg  Genetic screening   1 hour GTT 71  3 hour GTT   GBS Neg    [place] MFM FETAL BPP WO NON STRESS  Result Date: 12/02/2019 ----------------------------------------------------------------------  OBSTETRICS REPORT                        (Signed Final 12/02/2019 12:20 am) ---------------------------------------------------------------------- Patient Info  ID #:       [id number]                          D.O.B.:  June 17, 1985 (34 yrs)  Name:       [name] Young [facility]              Visit Date: 12/01/2019 03:07 pm ---------------------------------------------------------------------- Performed By  Attending:        Lin Landsman      Referred By:       Jeannett Senior                    MD                                        RANCOUR  Performed By:     Francene Boyers           [facility]          [facility]                                                              at [place]                                                              [place] ---------------------------------------------------------------------- Orders  #  Description                           Code        Ordered By  1  [place] MFM UA CORD DOPPLER  06237.62    NGWE AYCOCK  2  [place] MFM OB FOLLOW UP                   [id number]    NGWE AYCOCK  3  [place] MFM FETAL BPP WO NON               76819.01    NGWE AYCOCK     STRESS  ----------------------------------------------------------------------  #  Order #                     Accession #                Episode #  1  [id number]                   [id number]                 [id number]  2  [id number]                   [id number]                 [id number]  3  [id number]                   [id number]                 [id number] ---------------------------------------------------------------------- Indications  [redacted] weeks gestation of pregnancy                 Z3A.37  Tobacco use complicating pregnancy              O99.330  Maternal care for known or suspected poor       O36.5930  fetal growth, third trimester, not applicable or  unspecified IUGR ---------------------------------------------------------------------- Fetal Evaluation  Num Of Fetuses:          1  Fetal Heart Rate(bpm):   144  Cardiac Activity:        Observed  Fetal Lie:               Variable  Presentation:            Cephalic  Placenta:                Fundal  AFI Sum(cm)     %Tile       Largest Pocket(cm)  11.3            34          5.4  RUQ(cm)       RLQ(cm)       LUQ(cm)        LLQ(cm)  0             2.6           3.3            5.4 ---------------------------------------------------------------------- Biophysical Evaluation  Amniotic F.V:   Within normal limits       F. Tone:         Observed  F. Movement:    Observed                   Score:           8/8  F. Breathing:   Observed ---------------------------------------------------------------------- Biometry  BPD:      81.3  mm     G. Age:  32w 5d        < 1  %  CI:        70.26   %    70 - 86                                                          FL/HC:       20.6  %    20.8 - 22.6  HC:      309.3  mm     G. Age:  34w 4d        < 1  %    HC/AC:       0.98       0.92 - 1.05  AC:      315.9  mm     G. Age:  35w 4d         17  %    FL/BPD:      78.5  %    71 - 87  FL:       63.8  mm     G. Age:  33w 0d        < 1  %    FL/AC:       20.2  %    20 - 24  HUM:      59.5  mm     G.  Age:  34w 4d         19  %  Est. FW:    2431   gm     5 lb 6 oz    4.7  % ---------------------------------------------------------------------- OB History  Gravidity:    7         Prem:   6 ---------------------------------------------------------------------- Gestational Age  U/S Today:     34w 0d                                        EDD:   01/12/20  Best:          37w 2d     Det. By:  Marcella Dubs         EDD:   12/20/19                                      (06/01/19) ---------------------------------------------------------------------- Anatomy  Cranium:               Previously seen        Aortic Arch:            Previously seen  Cavum:                 Previously seen        Ductal Arch:            Previously seen  Ventricles:            Previously seen        Diaphragm:              Previously seen  Choroid Plexus:        Previously seen        Stomach:                Appears  normal, left                                                                        sided  Cerebellum:            Previously seen        Abdomen:                Previously seen  Posterior Fossa:       Previously seen        Abdominal Wall:         Previously seen  Nuchal Fold:           Previously seen        Cord Vessels:           Appears normal (3                                                                        vessel cord)  Face:                  Orbits previously      Kidneys:                Appear normal                         seen  Lips:                  Previously seen        Bladder:                Appears normal  Heart:                 Appears normal         Spine:                  Appears normal                         (4CH, axis, and                         situs)  RVOT:                  Previously seen        Upper Extremities:      Previously seen  LVOT:                  Appears normal         Lower Extremities:      Previously seen ---------------------------------------------------------------------- Doppler -  Fetal Vessels  Umbilical Artery    S/D    %tile      RI    %tile   2.51       62     0.6       68 ---------------------------------------------------------------------- Impression  Follow up growth with  measurements less than dates  There is good fetal movement and amniotic flud volume  There is normal UA Dopplers.  Biophysical profile 8/8  I discussed today's visit with a diagnosis of IUGR. I explained  that the etiology includes placental insufficiency, chronic  disease, infection, aneuploidy and other genetic syndromes  Her prior risk factor is tobacco use. She has no additional risk  factors for chronic disease. At this time I explained the  diagnosis, evaluation and management to include on going  fetal growth and weekly antenatal testing to include UA  Dopplers. If the EFW < 3rd% or abnormal testing, I  recommend delivery at 37 weeks otherwise if all is normal  consider delivery at 39 weeks.  Ms. Vandermeer has transportation challenges. She expressed  that she cannot come on a weekly basis for both childcare  and transportation issues. She expressed that she will likely  not be able to return in 1 week. Therefore, I recommend  consider delivery at 38 weeks give EFW at 4.7 %. We  discussed daily kick counts in detail. ---------------------------------------------------------------------- Recommendations  Follow up in 1 week for testing.  Consider delivery by 38 weeks. ----------------------------------------------------------------------               Lin Landsman, MD Electronically Signed Final Report   12/02/2019 12:20 am ----------------------------------------------------------------------  [place] MFM OB FOLLOW UP  Result Date: 12/02/2019 ----------------------------------------------------------------------  OBSTETRICS REPORT                        (Signed Final 12/02/2019 12:20 am) ---------------------------------------------------------------------- Patient Info  ID #:       [id number]                           D.O.B.:  Oct 31, 1985 (34 yrs)  Name:       [name] Young [facility]              Visit Date: 12/01/2019 03:07 pm ---------------------------------------------------------------------- Performed By  Attending:        Lin Landsman      Referred By:       Jeannett Senior                    MD                                        RANCOUR  Performed By:     Francene Boyers           [facility]          [facility]                                                              at [facility]  [place] ---------------------------------------------------------------------- Orders  #  Description                           Code        Ordered By  1  [place] MFM UA CORD DOPPLER                76820.02    NGWE AYCOCK  2  [place] MFM OB FOLLOW UP                   [id number]    NGWE AYCOCK  3  [place] MFM FETAL BPP WO NON               76819.01    NGWE AYCOCK     STRESS ----------------------------------------------------------------------  #  Order #                     Accession #                Episode #  1  [id number]                   [id number]                 [id number]  2  [id number]                   [id number]                 [id number]  3  [id number]                   [id number]                 [id number] ---------------------------------------------------------------------- Indications  [redacted] weeks gestation of pregnancy                 Z3A.37  Tobacco use complicating pregnancy              O99.330  Maternal care for known or suspected poor       O36.5930  fetal growth, third trimester, not applicable or  unspecified IUGR ---------------------------------------------------------------------- Fetal Evaluation  Num Of Fetuses:          1  Fetal Heart Rate(bpm):   144  Cardiac Activity:        Observed  Fetal Lie:               Variable  Presentation:            Cephalic  Placenta:                Fundal  AFI  Sum(cm)     %Tile       Largest Pocket(cm)  11.3            34          5.4  RUQ(cm)       RLQ(cm)       LUQ(cm)        LLQ(cm)  0             2.6           3.3            5.4 ---------------------------------------------------------------------- Biophysical Evaluation  Amniotic F.V:   Within normal limits       F. Tone:         Observed  F. Movement:    Observed  Score:           8/8  F. Breathing:   Observed ---------------------------------------------------------------------- Biometry  BPD:      81.3  mm     G. Age:  32w 5d        < 1  %    CI:        70.26   %    70 - 86                                                          FL/HC:       20.6  %    20.8 - 22.6  HC:      309.3  mm     G. Age:  34w 4d        < 1  %    HC/AC:       0.98       0.92 - 1.05  AC:      315.9  mm     G. Age:  35w 4d         17  %    FL/BPD:      78.5  %    71 - 87  FL:       63.8  mm     G. Age:  33w 0d        < 1  %    FL/AC:       20.2  %    20 - 24  HUM:      59.5  mm     G. Age:  34w 4d         19  %  Est. FW:    2431   gm     5 lb 6 oz    4.7  % ---------------------------------------------------------------------- OB History  Gravidity:    7         Prem:   6 ---------------------------------------------------------------------- Gestational Age  U/S Today:     34w 0d                                        EDD:   01/12/20  Best:          37w 2d     Det. By:  Marcella Dubs         EDD:   12/20/19                                      (06/01/19) ---------------------------------------------------------------------- Anatomy  Cranium:               Previously seen        Aortic Arch:            Previously seen  Cavum:                 Previously seen        Ductal Arch:            Previously seen  Ventricles:            Previously seen  Diaphragm:              Previously seen  Choroid Plexus:        Previously seen        Stomach:                Appears normal, left                                                                         sided  Cerebellum:            Previously seen        Abdomen:                Previously seen  Posterior Fossa:       Previously seen        Abdominal Wall:         Previously seen  Nuchal Fold:           Previously seen        Cord Vessels:           Appears normal (3                                                                        vessel cord)  Face:                  Orbits previously      Kidneys:                Appear normal                         seen  Lips:                  Previously seen        Bladder:                Appears normal  Heart:                 Appears normal         Spine:                  Appears normal                         (4CH, axis, and                         situs)  RVOT:                  Previously seen        Upper Extremities:      Previously seen  LVOT:                  Appears normal         Lower Extremities:      Previously seen ---------------------------------------------------------------------- Doppler -  Fetal Vessels  Umbilical Artery    S/D    %tile      RI    %tile   2.51       62     0.6       68 ---------------------------------------------------------------------- Impression  Follow up growth with measurements less than dates  There is good fetal movement and amniotic flud volume  There is normal UA Dopplers.  Biophysical profile 8/8  I discussed today's visit with a diagnosis of IUGR. I explained  that the etiology includes placental insufficiency, chronic  disease, infection, aneuploidy and other genetic syndromes  Her prior risk factor is tobacco use. She has no additional risk  factors for chronic disease. At this time I explained the  diagnosis, evaluation and management to include on going  fetal growth and weekly antenatal testing to include UA  Dopplers. If the EFW < 3rd% or abnormal testing, I  recommend delivery at 37 weeks otherwise if all is normal  consider delivery at 39 weeks.  Ms. Bobrowski has transportation challenges. She  expressed  that she cannot come on a weekly basis for both childcare  and transportation issues. She expressed that she will likely  not be able to return in 1 week. Therefore, I recommend  consider delivery at 38 weeks give EFW at 4.7 %. We  discussed daily kick counts in detail. ---------------------------------------------------------------------- Recommendations  Follow up in 1 week for testing.  Consider delivery by 38 weeks. ----------------------------------------------------------------------               Lin Landsman, MD Electronically Signed Final Report   12/02/2019 12:20 am ----------------------------------------------------------------------  [place] MFM UA CORD DOPPLER  Result Date: 12/02/2019 ----------------------------------------------------------------------  OBSTETRICS REPORT                        (Signed Final 12/02/2019 12:20 am) ---------------------------------------------------------------------- Patient Info  ID #:       [id number]                          D.O.B.:  Sep 08, 1985 (34 yrs)  Name:       [name] Young [facility]              Visit Date: 12/01/2019 03:07 pm ---------------------------------------------------------------------- Performed By  Attending:        Lin Landsman      Referred By:       Jeannett Senior                    MD                                        RANCOUR  Performed By:     Francene Boyers           [facility]          [facility]  [facility]                                                              [place] ---------------------------------------------------------------------- Orders  #  Description                           Code        Ordered By  1  US MFM UA CORD DOPPLER                76820.02    NGWE AYCOCK  2  US MFM OB FOLLOW UP                   [id number]    NGWE AYCOCK  3  US MFM FETAL BPP WO NON                76819.01    NGWE AYCOCK     STRESS ----------------------------------------------------------------------  #  Order #                     Accession #                Episode #  1  [id number]                   [id number]  2  [id number]                   [id number]  3  [id number]                   [id number]                 [id number] ---------------------------------------------------------------------- Indications  [redacted] weeks gestation of pregnancy                 Z3A.37  Tobacco use complicating pregnancy              O99.330  Maternal care for known or suspected poor       O36.5930  fetal growth, third trimester, not applicable or  unspecified IUGR ---------------------------------------------------------------------- Fetal Evaluation  Num Of Fetuses:          1  Fetal Heart Rate(bpm):   144  Cardiac Activity:        Observed  Fetal Lie:               Variable  Presentation:            Cephalic  Placenta:                Fundal  AFI Sum(cm)     %Tile       Largest Pocket(cm)  11.3            34          5.4  RUQ(cm)       RLQ(cm)       LUQ(cm)        LLQ(cm)  0  2.6           3.3            5.4 ---------------------------------------------------------------------- Biophysical Evaluation  Amniotic F.V:   Within normal limits       F. Tone:         Observed  F. Movement:    Observed                   Score:           8/8  F. Breathing:   Observed ---------------------------------------------------------------------- Biometry  BPD:      81.3  mm     G. Age:  32w 5d        < 1  %    CI:        70.26   %    70 - 86                                                          FL/HC:       20.6  %    20.8 - 22.6  HC:      309.3  mm     G. Age:  34w 4d        < 1  %    HC/AC:       0.98       0.92 - 1.05  AC:      315.9  mm     G. Age:  35w 4d         17  %    FL/BPD:      78.5  %    71 - 87  FL:       63.8  mm     G. Age:  33w 0d        < 1  %    FL/AC:       20.2  %     20 - 24  HUM:      59.5  mm     G. Age:  34w 4d         19  %  Est. FW:    2431   gm     5 lb 6 oz    4.7  % ---------------------------------------------------------------------- OB History  Gravidity:    7         Prem:   6 ---------------------------------------------------------------------- Gestational Age  U/S Today:     34w 0d                                        EDD:   01/12/20  Best:          37w 2d     Det. ByMarcella Dubs         EDD:   12/20/19                                      (06/01/19) ---------------------------------------------------------------------- Anatomy  Cranium:               Previously seen        Aortic  Arch:            Previously seen  Cavum:                 Previously seen        Ductal Arch:            Previously seen  Ventricles:            Previously seen        Diaphragm:              Previously seen  Choroid Plexus:        Previously seen        Stomach:                Appears normal, left                                                                        sided  Cerebellum:            Previously seen        Abdomen:                Previously seen  Posterior Fossa:       Previously seen        Abdominal Wall:         Previously seen  Nuchal Fold:           Previously seen        Cord Vessels:           Appears normal (3                                                                        vessel cord)  Face:                  Orbits previously      Kidneys:                Appear normal                         seen  Lips:                  Previously seen        Bladder:                Appears normal  Heart:                 Appears normal         Spine:                  Appears normal                         (4CH, axis, and                         situs)  RVOT:                  Previously seen        Upper Extremities:      Previously seen  LVOT:                  Appears normal         Lower Extremities:      Previously seen  ---------------------------------------------------------------------- Doppler - Fetal Vessels  Umbilical Artery    S/D    %tile      RI    %tile   2.51       62     0.6       68 ---------------------------------------------------------------------- Impression  Follow up growth with measurements less than dates  There is good fetal movement and amniotic flud volume  There is normal UA Dopplers.  Biophysical profile 8/8  I discussed today's visit with a diagnosis of IUGR. I explained  that the etiology includes placental insufficiency, chronic  disease, infection, aneuploidy and other genetic syndromes  Her prior risk factor is tobacco use. She has no additional risk  factors for chronic disease. At this time I explained the  diagnosis, evaluation and management to include on going  fetal growth and weekly antenatal testing to include UA  Dopplers. If the EFW < 3rd% or abnormal testing, I  recommend delivery at 37 weeks otherwise if all is normal  consider delivery at 39 weeks.  Ms. Roedel has transportation challenges. She expressed  that she cannot come on a weekly basis for both childcare  and transportation issues. She expressed that she will likely  not be able to return in 1 week. Therefore, I recommend  consider delivery at 38 weeks give EFW at 4.7 %. We  discussed daily kick counts in detail. ---------------------------------------------------------------------- Recommendations  Follow up in 1 week for testing.  Consider delivery by 38 weeks. ----------------------------------------------------------------------               Lin Landsman, MD Electronically Signed Final Report   12/02/2019 12:20 am ----------------------------------------------------------------------    Assessment:  [name] Young is a 34 y.o. [id number] female at [redacted]w[redacted]d with fetal growth restriction at 4.7%.   Plan:   5. Post Partum Planning: - Infant feeding: Bottle feeding - Contraception: BTL consent signed at 26 weeks  according to PN records - Tdap given 11/15/19 - Flu not given  Haroldine Laws, CNM 12/06/2019 10:55 PM

## 2019-12-08 ENCOUNTER — Other Ambulatory Visit: Payer: Self-pay

## 2019-12-08 ENCOUNTER — Inpatient Hospital Stay
Admission: EM | Admit: 2019-12-08 | Discharge: 2019-12-09 | DRG: 798 | Disposition: A | Discharge status: Home / Self Care | Payer: Medicaid Other | Attending: Certified Nurse Midwife | Admitting: Certified Nurse Midwife

## 2019-12-08 ENCOUNTER — Encounter: Payer: Self-pay | Admitting: Obstetrics and Gynecology

## 2019-12-08 ENCOUNTER — Inpatient Hospital Stay: Payer: Medicaid Other | Admitting: Certified Registered Nurse Anesthetist

## 2019-12-08 ENCOUNTER — Other Ambulatory Visit: Payer: Medicaid Other

## 2019-12-08 DIAGNOSIS — Z3A38 38 weeks gestation of pregnancy: Secondary | ICD-10-CM | POA: Diagnosis not present

## 2019-12-08 DIAGNOSIS — D509 Iron deficiency anemia, unspecified: Secondary | ICD-10-CM | POA: Diagnosis present

## 2019-12-08 DIAGNOSIS — Z302 Encounter for sterilization: Secondary | ICD-10-CM | POA: Diagnosis not present

## 2019-12-08 DIAGNOSIS — O365930 Maternal care for other known or suspected poor fetal growth, third trimester, not applicable or unspecified: Principal | ICD-10-CM | POA: Diagnosis present

## 2019-12-08 DIAGNOSIS — F17210 Nicotine dependence, cigarettes, uncomplicated: Secondary | ICD-10-CM | POA: Diagnosis present

## 2019-12-08 DIAGNOSIS — O9902 Anemia complicating childbirth: Secondary | ICD-10-CM | POA: Diagnosis present

## 2019-12-08 DIAGNOSIS — Z3490 Encounter for supervision of normal pregnancy, unspecified, unspecified trimester: Secondary | ICD-10-CM | POA: Diagnosis present

## 2019-12-08 DIAGNOSIS — Z20822 Contact with and (suspected) exposure to covid-19: Secondary | ICD-10-CM | POA: Diagnosis present

## 2019-12-08 DIAGNOSIS — O99334 Smoking (tobacco) complicating childbirth: Secondary | ICD-10-CM | POA: Diagnosis present

## 2019-12-08 LAB — CBC
HCT: 27.2 % — ABNORMAL LOW (ref 36.0–46.0)
Hemoglobin: 9 g/dL — ABNORMAL LOW (ref 12.0–15.0)
MCH: 28.2 pg (ref 26.0–34.0)
MCHC: 33.1 g/dL (ref 30.0–36.0)
MCV: 85.3 fL (ref 80.0–100.0)
Platelets: 183 10*3/uL (ref 150–400)
RBC: 3.19 MIL/uL — ABNORMAL LOW (ref 3.87–5.11)
RDW: 13.4 % (ref 11.5–15.5)
WBC: 10.3 10*3/uL (ref 4.0–10.5)
nRBC: 0 % (ref 0.0–0.2)

## 2019-12-08 LAB — URINE DRUG SCREEN, QUALITATIVE (ARMC ONLY)
Amphetamines, Ur Screen: NOT DETECTED
Barbiturates, Ur Screen: NOT DETECTED
Benzodiazepine, Ur Scrn: NOT DETECTED
Cannabinoid 50 Ng, Ur ~~LOC~~: NOT DETECTED
Cocaine Metabolite,Ur ~~LOC~~: POSITIVE — AB
MDMA (Ecstasy)Ur Screen: NOT DETECTED
Methadone Scn, Ur: NOT DETECTED
Opiate, Ur Screen: NOT DETECTED
Phencyclidine (PCP) Ur S: NOT DETECTED
Tricyclic, Ur Screen: NOT DETECTED

## 2019-12-08 LAB — TYPE AND SCREEN
ABO/RH(D): O POS
Antibody Screen: NEGATIVE

## 2019-12-08 LAB — SARS CORONAVIRUS 2 BY RT PCR (HOSPITAL ORDER, PERFORMED IN ~~LOC~~ HOSPITAL LAB): SARS Coronavirus 2: NEGATIVE

## 2019-12-08 LAB — RPR: RPR Ser Ql: NONREACTIVE

## 2019-12-08 MED ORDER — OXYTOCIN-SODIUM CHLORIDE 30-0.9 UT/500ML-% IV SOLN
2.5000 [IU]/h | INTRAVENOUS | Status: DC
  Administered 2019-12-08 (×2): 2.5 [IU]/h via INTRAVENOUS
  Filled 2019-12-08: qty 1000

## 2019-12-08 MED ORDER — SOD CITRATE-CITRIC ACID 500-334 MG/5ML PO SOLN: 30.0000 mL | ORAL | Status: DC | PRN

## 2019-12-08 MED ORDER — ACETAMINOPHEN 325 MG PO TABS: 650.0000 mg | ORAL_TABLET | ORAL | Status: DC | PRN

## 2019-12-08 MED ORDER — DIBUCAINE (PERIANAL) 1 % EX OINT: 1.0000 "application " | TOPICAL_OINTMENT | CUTANEOUS | Status: DC | PRN

## 2019-12-08 MED ORDER — OXYCODONE-ACETAMINOPHEN 5-325 MG PO TABS: 1.0000 | ORAL_TABLET | ORAL | Status: DC | PRN

## 2019-12-08 MED ORDER — FENTANYL 2.5 MCG/ML W/ROPIVACAINE 0.15% IN NS 100 ML EPIDURAL (ARMC)
EPIDURAL | Status: AC
  Filled 2019-12-08: qty 100

## 2019-12-08 MED ORDER — FENTANYL 2.5 MCG/ML W/ROPIVACAINE 0.15% IN NS 100 ML EPIDURAL (ARMC)
EPIDURAL | Status: DC | PRN
  Administered 2019-12-08: 12 mL/h via EPIDURAL

## 2019-12-08 MED ORDER — PROMETHAZINE HCL 25 MG/ML IJ SOLN: 12.5000 mg | Freq: Four times a day (QID) | INTRAMUSCULAR | Status: DC | PRN

## 2019-12-08 MED ORDER — LIDOCAINE HCL (PF) 1 % IJ SOLN: 30.0000 mL | INTRAMUSCULAR | Status: DC | PRN

## 2019-12-08 MED ORDER — ONDANSETRON HCL 4 MG/2ML IJ SOLN
4.0000 mg | Freq: Four times a day (QID) | INTRAMUSCULAR | Status: DC | PRN
  Administered 2019-12-08: 4 mg via INTRAVENOUS
  Filled 2019-12-08: qty 2

## 2019-12-08 MED ORDER — LACTATED RINGERS IV SOLN
500.0000 mL | Freq: Once | INTRAVENOUS | Status: AC
  Administered 2019-12-08: 500 mL via INTRAVENOUS

## 2019-12-08 MED ORDER — ONDANSETRON HCL 4 MG PO TABS: 4.0000 mg | ORAL_TABLET | ORAL | Status: DC | PRN

## 2019-12-08 MED ORDER — PHENYLEPHRINE 40 MCG/ML (10ML) SYRINGE FOR IV PUSH (FOR BLOOD PRESSURE SUPPORT)
80.0000 ug | PREFILLED_SYRINGE | INTRAVENOUS | Status: DC | PRN
  Filled 2019-12-08: qty 10

## 2019-12-08 MED ORDER — WITCH HAZEL-GLYCERIN EX PADS
1.0000 "application " | MEDICATED_PAD | CUTANEOUS | Status: DC
  Administered 2019-12-08: 1 via TOPICAL
  Filled 2019-12-08: qty 100

## 2019-12-08 MED ORDER — BUTORPHANOL TARTRATE 1 MG/ML IJ SOLN
1.0000 mg | INTRAMUSCULAR | Status: DC | PRN
  Administered 2019-12-08: 1 mg via INTRAVENOUS
  Filled 2019-12-08: qty 1

## 2019-12-08 MED ORDER — BENZOCAINE-MENTHOL 20-0.5 % EX AERO
1.0000 "application " | INHALATION_SPRAY | CUTANEOUS | Status: DC | PRN
  Administered 2019-12-08: 1 via TOPICAL
  Filled 2019-12-08: qty 56

## 2019-12-08 MED ORDER — LIDOCAINE HCL (PF) 1 % IJ SOLN
INTRAMUSCULAR | Status: AC
  Filled 2019-12-08: qty 30

## 2019-12-08 MED ORDER — FERROUS SULFATE 325 (65 FE) MG PO TABS
325.0000 mg | ORAL_TABLET | Freq: Two times a day (BID) | ORAL | Status: DC
  Administered 2019-12-08: 325 mg via ORAL
  Filled 2019-12-08: qty 1

## 2019-12-08 MED ORDER — OXYCODONE-ACETAMINOPHEN 5-325 MG PO TABS: 2.0000 | ORAL_TABLET | ORAL | Status: DC | PRN

## 2019-12-08 MED ORDER — OXYTOCIN BOLUS FROM INFUSION
333.0000 mL | Freq: Once | INTRAVENOUS | Status: AC
  Administered 2019-12-08: 333 mL via INTRAVENOUS

## 2019-12-08 MED ORDER — LACTATED RINGERS IV SOLN: 500.0000 mL | INTRAVENOUS | Status: DC | PRN

## 2019-12-08 MED ORDER — FENTANYL 2.5 MCG/ML W/ROPIVACAINE 0.15% IN NS 100 ML EPIDURAL (ARMC): 12.0000 mL/h | EPIDURAL | Status: DC

## 2019-12-08 MED ORDER — BUPIVACAINE HCL (PF) 0.25 % IJ SOLN
INTRAMUSCULAR | Status: DC | PRN
  Administered 2019-12-08 (×2): 3 mL via EPIDURAL

## 2019-12-08 MED ORDER — ACETAMINOPHEN 500 MG PO TABS
1000.0000 mg | ORAL_TABLET | Freq: Four times a day (QID) | ORAL | Status: DC | PRN
  Administered 2019-12-08 – 2019-12-09 (×3): 1000 mg via ORAL
  Filled 2019-12-08 (×3): qty 2

## 2019-12-08 MED ORDER — MISOPROSTOL 200 MCG PO TABS
ORAL_TABLET | ORAL | Status: AC
  Administered 2019-12-08: 25 ug via VAGINAL
  Filled 2019-12-08: qty 4

## 2019-12-08 MED ORDER — METHYLERGONOVINE MALEATE 0.2 MG/ML IJ SOLN
INTRAMUSCULAR | Status: AC
  Filled 2019-12-08: qty 1

## 2019-12-08 MED ORDER — BUTORPHANOL TARTRATE 1 MG/ML IJ SOLN
INTRAMUSCULAR | Status: AC
  Filled 2019-12-08: qty 1

## 2019-12-08 MED ORDER — MISOPROSTOL 100 MCG PO TABS
25.0000 ug | ORAL_TABLET | ORAL | Status: DC | PRN
  Administered 2019-12-08: 25 ug via VAGINAL
  Filled 2019-12-08 (×3): qty 1

## 2019-12-08 MED ORDER — DOCUSATE SODIUM 100 MG PO CAPS
100.0000 mg | ORAL_CAPSULE | Freq: Two times a day (BID) | ORAL | Status: DC
  Administered 2019-12-09: 100 mg via ORAL
  Filled 2019-12-08: qty 1

## 2019-12-08 MED ORDER — IBUPROFEN 600 MG PO TABS
600.0000 mg | ORAL_TABLET | Freq: Four times a day (QID) | ORAL | Status: DC
  Administered 2019-12-08 – 2019-12-09 (×3): 600 mg via ORAL
  Filled 2019-12-08 (×3): qty 1

## 2019-12-08 MED ORDER — OXYCODONE HCL 5 MG PO TABS
5.0000 mg | ORAL_TABLET | Freq: Four times a day (QID) | ORAL | Status: DC | PRN
  Administered 2019-12-08 – 2019-12-09 (×3): 5 mg via ORAL
  Filled 2019-12-08 (×4): qty 1

## 2019-12-08 MED ORDER — NALBUPHINE HCL 10 MG/ML IJ SOLN
2.5000 mg | Freq: Four times a day (QID) | INTRAMUSCULAR | Status: DC | PRN
  Administered 2019-12-08 – 2019-12-09 (×3): 2.5 mg via INTRAVENOUS
  Filled 2019-12-08 (×2): qty 1

## 2019-12-08 MED ORDER — COCONUT OIL OIL: 1.0000 "application " | TOPICAL_OIL | Status: DC | PRN

## 2019-12-08 MED ORDER — OXYTOCIN 10 UNIT/ML IJ SOLN
INTRAMUSCULAR | Status: AC
  Filled 2019-12-08: qty 2

## 2019-12-08 MED ORDER — DIPHENHYDRAMINE HCL 25 MG PO CAPS
25.0000 mg | ORAL_CAPSULE | Freq: Four times a day (QID) | ORAL | Status: DC | PRN
  Administered 2019-12-08 – 2019-12-09 (×2): 25 mg via ORAL
  Filled 2019-12-08 (×2): qty 1

## 2019-12-08 MED ORDER — EPHEDRINE 5 MG/ML INJ
10.0000 mg | INTRAVENOUS | Status: DC | PRN
  Filled 2019-12-08: qty 2

## 2019-12-08 MED ORDER — LIDOCAINE HCL (PF) 1 % IJ SOLN
INTRAMUSCULAR | Status: DC | PRN
  Administered 2019-12-08: 3 mL

## 2019-12-08 MED ORDER — LACTATED RINGERS IV SOLN: INTRAVENOUS | Status: DC

## 2019-12-08 MED ORDER — MISOPROSTOL 100 MCG PO TABS
25.0000 ug | ORAL_TABLET | ORAL | Status: DC | PRN
  Administered 2019-12-08: 25 ug via BUCCAL
  Filled 2019-12-08 (×2): qty 1

## 2019-12-08 MED ORDER — SIMETHICONE 80 MG PO CHEW: 80.0000 mg | CHEWABLE_TABLET | ORAL | Status: DC | PRN

## 2019-12-08 MED ORDER — LIDOCAINE-EPINEPHRINE (PF) 1.5 %-1:200000 IJ SOLN
INTRAMUSCULAR | Status: DC | PRN
  Administered 2019-12-08: 3 mL via PERINEURAL

## 2019-12-08 MED ORDER — PRENATAL MULTIVITAMIN CH
1.0000 | ORAL_TABLET | Freq: Every day | ORAL | Status: DC
  Administered 2019-12-08: 1 via ORAL
  Filled 2019-12-08: qty 1

## 2019-12-08 MED ORDER — ONDANSETRON HCL 4 MG/2ML IJ SOLN: 4.0000 mg | INTRAMUSCULAR | Status: DC | PRN

## 2019-12-08 MED ORDER — AMMONIA AROMATIC IN INHA
RESPIRATORY_TRACT | Status: AC
  Filled 2019-12-08: qty 10

## 2019-12-08 MED ORDER — TERBUTALINE SULFATE 1 MG/ML IJ SOLN: 0.2500 mg | Freq: Once | INTRAMUSCULAR | Status: DC | PRN

## 2019-12-08 MED ORDER — DIPHENHYDRAMINE HCL 50 MG/ML IJ SOLN: 12.5000 mg | INTRAMUSCULAR | Status: DC | PRN

## 2019-12-08 NOTE — Anesthesia Preprocedure Evaluation (Addendum)
Anesthesia Evaluation   Patient awake and Patient confused    Reviewed: Allergy & Precautions, NPO status , Patient's Chart, lab work & pertinent test results  Airway Mallampati: II  TM Distance: >3 FB Neck ROM: full    Dental no notable dental hx.    Pulmonary Current Smoker and Patient abstained from smoking.,    Pulmonary exam normal        Cardiovascular negative cardio ROS Normal cardiovascular exam     Neuro/Psych PSYCHIATRIC DISORDERS Anxiety Depression Bipolar Disorder negative neurological ROS     GI/Hepatic negative GI ROS, Neg liver ROS,   Endo/Other  negative endocrine ROS  Renal/GU negative Renal ROS  negative genitourinary   Musculoskeletal negative musculoskeletal ROS (+)   Abdominal   Peds negative pediatric ROS (+)  Hematology  (+) Blood dyscrasia, anemia ,   Anesthesia Other Findings   Reproductive/Obstetrics (+) Pregnancy                            Anesthesia Physical Anesthesia Plan  ASA: II  Anesthesia Plan: Epidural   Post-op Pain Management:    Induction:   PONV Risk Score and Plan:   Airway Management Planned:   Additional Equipment:   Intra-op Plan:   Post-operative Plan:   Informed Consent: I have reviewed the patients History and Physical, chart, labs and discussed the procedure including the risks, benefits and alternatives for the proposed anesthesia with the patient or authorized representative who has indicated his/her understanding and acceptance.     Dental Advisory Given  Plan Discussed with: Anesthesiologist and CRNA  Anesthesia Plan Comments:         Anesthesia Quick Evaluation

## 2019-12-08 NOTE — Progress Notes (Signed)
Labor Progress Note  [name] Young is a 34 y.o. [id number] at [redacted]w[redacted]d by ultrasound admitted for induction of labor due to IUGR.  Subjective: feeling better after epidural  Objective: BP 91/61   Pulse 76   Temp 97.9 F (36.6 C) (Oral)   Resp 18   Ht 5\' 3"  (1.6 m)   Wt 67.1 kg   LMP 03/20/2019   BMI 26.22 kg/m  Notable VS details: reviewed   Fetal Assessment: FHT:  FHR: 135 bpm, variability: moderate,  accelerations:  Present,  decelerations:  Present intermittent variables Category/reactivity:  Category II UC:   regular, every 3-5 minutes SVE:   4-5/80/0/soft/ant Membrane status: AROM for clear fluid at 0906 Amniotic color: Clear   Labs: Lab Results  Component Value Date   WBC 10.3 12/08/2019   HGB 9.0 (L) 12/08/2019   HCT 27.2 (L) 12/08/2019   MCV 85.3 12/08/2019   PLT 183 12/08/2019    Assessment / Plan: Induction of labor d/t IUGR, progressing well after 2 doses of misoprostol   Labor: Progressing normally Preeclampsia:  no s/s Fetal Wellbeing:  Category II - overall reassuring with moderate variability  Pain Control:  Epidural I/D:  n/a Anticipated MOD:  NSVD  02/07/2020, CNM 12/08/2019, 9:11 AM

## 2019-12-08 NOTE — Discharge Instructions (Signed)
Vaginal Delivery, Care After Refer to this sheet in the next few weeks. These discharge instructions provide you with information on caring for yourself after delivery. Your caregiver may also give you specific instructions. Your treatment has been planned according to the most current medical practices available, but problems sometimes occur. Call your caregiver if you have any problems or questions after you go home. HOME CARE INSTRUCTIONS 1. Take over-the-counter or prescription medicines only as directed by your caregiver or pharmacist. 2. Do not drink alcohol, especially if you are breastfeeding or taking medicine to relieve pain. 3. Do not smoke tobacco. 4. Continue to use good perineal care. Good perineal care includes: 1. Wiping your perineum from back to front 2. Keeping your perineum clean. 3. You can do sitz baths twice a day, to help keep this area clean 5. Do not use tampons, douche or have sex until your caregiver says it is okay. 6. Shower only and avoid sitting in submerged water, aside from sitz baths 7. Wear a well-fitting bra that provides breast support. 8. Eat healthy foods. 9. Drink enough fluids to keep your urine clear or pale yellow. 10. Eat high-fiber foods such as whole grain cereals and breads, brown rice, beans, and fresh fruits and vegetables every day. These foods may help prevent or relieve constipation. 11. Avoid constipation with high fiber foods or medications, such as miralax or metamucil 12. Follow your caregiver's recommendations regarding resumption of activities such as climbing stairs, driving, lifting, exercising, or traveling. 13. Talk to your caregiver about resuming sexual activities. Resumption of sexual activities is dependent upon your risk of infection, your rate of healing, and your comfort and desire to resume sexual activity. 14. Try to have someone help you with your household activities and your newborn for at least a few days after you leave  the hospital. 15. Rest as much as possible. Try to rest or take a nap when your newborn is sleeping. 16. Increase your activities gradually. 17. Keep all of your scheduled postpartum appointments. It is very important to keep your scheduled follow-up appointments. At these appointments, your caregiver will be checking to make sure that you are healing physically and emotionally. SEEK MEDICAL CARE IF:   You are passing large clots from your vagina. Save any clots to show your caregiver.  You have a foul smelling discharge from your vagina.  You have trouble urinating.  You are urinating frequently.  You have pain when you urinate.  You have a change in your bowel movements.  You have increasing redness, pain, or swelling near your vaginal incision (episiotomy) or vaginal tear.  You have pus draining from your episiotomy or vaginal tear.  Your episiotomy or vaginal tear is separating.  You have painful, hard, or reddened breasts.  You have a severe headache.  You have blurred vision or see spots.  You feel sad or depressed.  You have thoughts of hurting yourself or your newborn.  You have questions about your care, the care of your newborn, or medicines.  You are dizzy or light-headed.  You have a rash.  You have nausea or vomiting.  You were breastfeeding and have not had a menstrual period within 12 weeks after you stopped breastfeeding.  You are not breastfeeding and have not had a menstrual period by the 12th week after delivery.  You have a fever. SEEK IMMEDIATE MEDICAL CARE IF:   You have persistent pain.  You have chest pain.  You have shortness of breath.    You faint.  You have leg pain.  You have stomach pain.  Your vaginal bleeding saturates two or more sanitary pads in 1 hour. MAKE SURE YOU:   Understand these instructions.  Will watch your condition.  Will get help right away if you are not doing well or get worse. Document Released:  04/18/2000 Document Revised: 09/05/2013 Document Reviewed: 12/17/2011 Exit[employer] Patient Information 2015 [employer]. This information is not intended to replace advice given to you by your health care provider. Make sure you discuss any questions you have with your health care provider.  Sitz Bath A sitz bath is a warm water bath taken in the sitting position. The water covers only the hips and butt (buttocks). We recommend using one that fits in the toilet, to help with ease of use and cleanliness. It may be used for either healing or cleaning purposes. Sitz baths are also used to relieve pain, itching, or muscle tightening (spasms). The water may contain medicine. Moist heat will help you heal and relax.  HOME CARE  Take 3 to 4 sitz baths a day. 18. Fill the bathtub half-full with warm water. 19. Sit in the water and open the drain a little. 20. Turn on the warm water to keep the tub half-full. Keep the water running constantly. 21. Soak in the water for 15 to 20 minutes. 22. After the sitz bath, pat the affected area dry. GET HELP RIGHT AWAY IF: You get worse instead of better. Stop the sitz baths if you get worse. MAKE SURE YOU:  Understand these instructions.  Will watch your condition.  Will get help right away if you are not doing well or get worse. Document Released: 05/29/2004 Document Revised: 01/14/2012 Document Reviewed: 08/19/2010 [employer] Patient Information 2015 [employer]. This information is not intended to replace advice given to you by your health care provider. Make sure you discuss any questions you have with your health care provider.    

## 2019-12-08 NOTE — Lactation Note (Addendum)
This note was copied from a baby's chart. Lactation Consultation Note  Patient Name: [name] Young [id number] Date: 12/08/2019   Mom's feeding choice is only to bottlefeed formula just like she did with her other children.  Hand out on Infant Formula Preparation given and reviewed storage, sanitizing of feeding and preparation equipment, mixing powdered formula with water and protection against cronobacter.  Mom reports that she already knows how to dry up her milk, but reviewed getting on well fitting supportive bra, cold, cabbage leaves and not to stimulate, take milk out or use warmth while milk coming in.  Discussed differences and treatment of full breasts, engorgement, plugged ducts and mastitis and when to seek further assistance or call OB doctor for antibiotics.  Encouraged to call with further questions or concerns.  Maternal Data    Feeding Feeding Type: Bottle Fed - Formula Nipple Type: Slow - flow  LATCH Score                   Interventions    Lactation Tools Discussed/Used     Consult Status      [name] Young 12/08/2019, 3:44 PM

## 2019-12-08 NOTE — Progress Notes (Signed)
Patient ID: [name] Young, female   DOB: Jan 11, 1986, 34 y.o.   MRN: [id number] Pt is DOD svd + cocaine .s/p prior L/S surgery for ectopic pregnancy in past . She has been scheduled for PP BTL for sterilization in am . I have consented her for the procedure and the risks of the procedure . Organ injury , failure for BTL 1:200 / yr , blood transfusion . All questions HAve been answered and Medicaid consent is on the chart  NPO at 2400

## 2019-12-08 NOTE — Anesthesia Procedure Notes (Signed)
Epidural Patient location during procedure: OB Start time: 12/08/2019 7:58 AM End time: 12/08/2019 8:12 AM  Staffing Anesthesiologist: Yves Dill, MD Resident/CRNA: Rosanne Gutting, CRNA Performed: resident/CRNA   Preanesthetic Checklist Completed: patient identified, IV checked, site marked, risks and benefits discussed, surgical consent, monitors and equipment checked, pre-op evaluation and timeout performed  Epidural Patient position: sitting Prep: ChloraPrep Patient monitoring: heart rate, continuous pulse ox and blood pressure Approach: midline Location: L3-L4 Injection technique: LOR saline  Needle:  Needle type: Tuohy  Needle gauge: 17 G Needle length: 9 cm and 9 Needle insertion depth: 6 cm Catheter type: closed end flexible Catheter size: 19 Gauge Catheter at skin depth: 12 cm Test dose: negative and 1.5% lidocaine with Epi 1:200 K  Assessment Sensory level: T10 Events: blood not aspirated, injection not painful, no injection resistance, no paresthesia and negative IV test  Additional Notes 1 attempt Pt. Evaluated and documentation done after procedure finished. Patient identified. Risks/Benefits/Options discussed with patient including but not limited to bleeding, infection, nerve damage, paralysis, failed block, incomplete pain control, headache, blood pressure changes, nausea, vomiting, reactions to medication both or allergic, itching and postpartum back pain. Confirmed with bedside nurse the patient's most recent platelet count. Confirmed with patient that they are not currently taking any anticoagulation, have any bleeding history or any family history of bleeding disorders. Patient expressed understanding and wished to proceed. All questions were answered. Sterile technique was used throughout the entire procedure. Please see nursing notes for vital signs. Test dose was given through epidural catheter and negative prior to continuing to dose epidural or start  infusion. Warning signs of high block given to the patient including shortness of breath, tingling/numbness in hands, complete motor block, or any concerning symptoms with instructions to call for help. Patient was given instructions on fall risk and not to get out of bed. All questions and concerns addressed with instructions to call with any issues or inadequate analgesia.   Patient tolerated the insertion well without immediate complications.Reason for block:procedure for pain

## 2019-12-08 NOTE — H&P (Signed)
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 Signed Final Report   12/02/2019 12:20 am ----------------------------------------------------------------------    Assessment:  BRITANI BEATTIE is a 34 y.o. [id number] female at [redacted]w[redacted]d with IOL for IUGR.   Plan:  1. Admit to Labor & Delivery; consents reviewed and obtained  2. Fetal Well being  - Fetal Tracing: category II for variable decels, overall reassuring - GBS negative - Presentation: cephalic confirmed by Leopold's   3. Routine OB: - Prenatal labs reviewed, as above - Rh positive - CBC & T&S on admit - Clear fluids, IVF  4. Induction of Labor: -  Contractions monitored with external toco in place -  Pelvis proven to 3289g -  Plan for induction with misoprostol -  Plan for continuous fetal monitoring  -  Maternal pain control as  desired: IVPM, nitrous, regional anesthesia - Anticipate vaginal delivery  5. Post Partum Planning: - Infant feeding: formula - Contraception: BTL, per prenatal records consents signed at 26w visit  Genia Del, CNM 12/08/2019 7:06 AM ----- Genia Del Certified Nurse Midwife [facility], Department of OB/GYN [facility]

## 2019-12-08 NOTE — Progress Notes (Signed)
I spoke with CNM about lack of UDS during hospital admission. CNM discussed that UDS during pregnancy was negative and that she did not need her to have a UDS at this time. The patient requested an epidural. After the epidural, I spoke with the oncoming midwife about the patient's not having a current UDS. She ordered one and it was sent after the patient's precipitous delivery.  CNM and ANMD aware of positive UDS.

## 2019-12-09 ENCOUNTER — Inpatient Hospital Stay: Payer: Medicaid Other | Admitting: Anesthesiology

## 2019-12-09 ENCOUNTER — Encounter: Payer: Self-pay | Admitting: Obstetrics and Gynecology

## 2019-12-09 ENCOUNTER — Encounter
Admission: EM | Disposition: A | Discharge status: Home / Self Care | Payer: Self-pay | Source: Home / Self Care | Attending: Certified Nurse Midwife

## 2019-12-09 HISTORY — PX: TUBAL LIGATION: SHX77

## 2019-12-09 LAB — CBC
HCT: 26.6 % — ABNORMAL LOW (ref 36.0–46.0)
Hemoglobin: 8.7 g/dL — ABNORMAL LOW (ref 12.0–15.0)
MCH: 28.1 pg (ref 26.0–34.0)
MCHC: 32.7 g/dL (ref 30.0–36.0)
MCV: 85.8 fL (ref 80.0–100.0)
Platelets: 163 10*3/uL (ref 150–400)
RBC: 3.1 MIL/uL — ABNORMAL LOW (ref 3.87–5.11)
RDW: 13.2 % (ref 11.5–15.5)
WBC: 9.9 10*3/uL (ref 4.0–10.5)
nRBC: 0 % (ref 0.0–0.2)

## 2019-12-09 SURGERY — LIGATION, FALLOPIAN TUBE, POSTPARTUM
Anesthesia: Spinal | Laterality: Right

## 2019-12-09 MED ORDER — FERROUS SULFATE 325 (65 FE) MG PO TABS: 325.0000 mg | ORAL_TABLET | Freq: Two times a day (BID) | ORAL | Status: DC

## 2019-12-09 MED ORDER — LACTATED RINGERS IV SOLN: INTRAVENOUS | Status: DC

## 2019-12-09 MED ORDER — MIDAZOLAM HCL 2 MG/2ML IJ SOLN
INTRAMUSCULAR | Status: AC
  Filled 2019-12-09: qty 2

## 2019-12-09 MED ORDER — KETOROLAC TROMETHAMINE 30 MG/ML IJ SOLN
INTRAMUSCULAR | Status: DC | PRN
  Administered 2019-12-09: 30 mg via INTRAVENOUS

## 2019-12-09 MED ORDER — BUPIVACAINE HCL (PF) 0.5 % IJ SOLN
INTRAMUSCULAR | Status: AC
  Filled 2019-12-09: qty 30

## 2019-12-09 MED ORDER — BUPIVACAINE HCL 0.5 % IJ SOLN
INTRAMUSCULAR | Status: DC | PRN
  Administered 2019-12-09: 5 mL

## 2019-12-09 MED ORDER — FENTANYL CITRATE (PF) 100 MCG/2ML IJ SOLN
INTRAMUSCULAR | Status: DC | PRN
  Administered 2019-12-09 (×2): 50 ug via INTRAVENOUS

## 2019-12-09 MED ORDER — FENTANYL CITRATE (PF) 100 MCG/2ML IJ SOLN
INTRAMUSCULAR | Status: AC
  Filled 2019-12-09: qty 2

## 2019-12-09 MED ORDER — MIDAZOLAM HCL 2 MG/2ML IJ SOLN
INTRAMUSCULAR | Status: DC | PRN
  Administered 2019-12-09 (×3): 2 mg via INTRAVENOUS

## 2019-12-09 MED ORDER — BUPIVACAINE IN DEXTROSE 0.75-8.25 % IT SOLN
INTRATHECAL | Status: DC | PRN
  Administered 2019-12-09: 2 mL via INTRATHECAL

## 2019-12-09 SURGICAL SUPPLY — 34 items
ADH SKN CLS APL DERMABOND .7 (GAUZE/BANDAGES/DRESSINGS) ×1
APL PRP STRL LF DISP 70% ISPRP (MISCELLANEOUS) ×1
APPLICATOR COTTON TIP 6IN STRL (MISCELLANEOUS) ×2 IMPLANT
BLADE SURG SZ11 CARB STEEL (BLADE) ×2 IMPLANT
CANISTER SUCT 1200ML W/VALVE (MISCELLANEOUS) ×2 IMPLANT
CHLORAPREP W/TINT 26 (MISCELLANEOUS) ×2 IMPLANT
COVER WAND RF STERILE (DRAPES) ×2 IMPLANT
DERMABOND ADVANCED (GAUZE/BANDAGES/DRESSINGS) ×1
DERMABOND ADVANCED .7 DNX12 (GAUZE/BANDAGES/DRESSINGS) ×1 IMPLANT
DRAPE LAPAROTOMY 77X122 PED (DRAPES) ×2 IMPLANT
DRSG TEGADERM 2-3/8X2-3/4 SM (GAUZE/BANDAGES/DRESSINGS) IMPLANT
DRSG TEGADERM 4X4.75 (GAUZE/BANDAGES/DRESSINGS) IMPLANT
ELECT CAUTERY BLADE 6.4 (BLADE) ×2 IMPLANT
ELECT REM PT RETURN 9FT ADLT (ELECTROSURGICAL) ×2
ELECTRODE REM PT RTRN 9FT ADLT (ELECTROSURGICAL) ×1 IMPLANT
GLOVE SURG SYN 8.0 (GLOVE) ×2 IMPLANT
GLOVE SURG SYN 8.0 PF PI (GLOVE) ×1 IMPLANT
GOWN STRL REUS W/ TWL LRG LVL3 (GOWN DISPOSABLE) ×1 IMPLANT
GOWN STRL REUS W/ TWL XL LVL3 (GOWN DISPOSABLE) ×1 IMPLANT
GOWN STRL REUS W/TWL LRG LVL3 (GOWN DISPOSABLE) ×2
GOWN STRL REUS W/TWL XL LVL3 (GOWN DISPOSABLE) ×2
KIT TURNOVER KIT A (KITS) ×2 IMPLANT
LABEL OR SOLS (LABEL) ×2 IMPLANT
NEEDLE HYPO 22GX1.5 SAFETY (NEEDLE) ×2 IMPLANT
NS IRRIG 500ML POUR BTL (IV SOLUTION) ×2 IMPLANT
PACK BASIN MINOR (MISCELLANEOUS) ×2 IMPLANT
SPONGE GAUZE 2X2 8PLY STRL LF (GAUZE/BANDAGES/DRESSINGS) ×2 IMPLANT
STRIP CLOSURE SKIN 1/4X4 (GAUZE/BANDAGES/DRESSINGS) IMPLANT
SUT PLAIN GUT 0 (SUTURE) ×4 IMPLANT
SUT VIC AB 2-0 UR6 27 (SUTURE) ×2 IMPLANT
SUT VIC AB 4-0 SH 27 (SUTURE) ×2
SUT VIC AB 4-0 SH 27XANBCTRL (SUTURE) ×1 IMPLANT
SWABSTK COMLB BENZOIN TINCTURE (MISCELLANEOUS) IMPLANT
SYR 10ML LL (SYRINGE) ×2 IMPLANT

## 2019-12-09 NOTE — Progress Notes (Signed)
Pt ready for surgery . Labs reviewed . NPO . All questions answered . Proceed
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
## 2019-12-09 NOTE — Op Note (Signed)
NAME: AMIYAH, SHRYOCK MEDICAL RECORD [id number] ACCOUNT [id number] DATE OF BIRTH:Jul 15, 1985 FACILITY: [facility] LOCATION: [facility] PHYSICIAN:Billal Rollo Cloyde Reams, MD  OPERATIVE REPORT  DATE OF PROCEDURE:  12/09/2019  PREOPERATIVE DIAGNOSIS:  Elective sterilization.  POSTOPERATIVE DIAGNOSIS:  Elective sterilization.  PROCEDURE:  Right partial salpingectomy.  ANESTHESIA:  Spinal.  SURGEON:  Jennell Corner, MD  INDICATIONS:  A 34 year old gravida 8, now para 7 patient, status post spontaneous vaginal delivery the day prior.  The patient is status post laparoscopic ectopic surgery, presumed to be left salpingectomy.  DESCRIPTION OF PROCEDURE:  After adequate spinal anesthesia, the patient was placed in dorsal supine position.  The patient's abdomen was prepped and draped in normal sterile fashion.  Foley catheter was placed.  A timeout was performed.  A 15 mm  infraumbilical incision was made after injecting with 0.5% Marcaine.  Fascia was then opened sharply without difficulty, as well as the peritoneal cavity.  Attention was directed to the patient's right uterine side.  The patient was placed in sideway  tilt with Trendelenburg.  The right fallopian tube was identified and grasped with a Babcock clamp and the fimbriated end was visualized.  Two separate 0 plain gut sutures were placed in the midportion of the fallopian tube and a 1.5 cm portion of  fallopian tube was removed and will be sent to pathology for identification.  Good hemostasis was noted.  Attention was directed to the patient's left fallopian tube, which could not be identified.  After several attempts, the left ovary and the round  ligaments were identified, but no fimbriated end of the fallopian tube nor proximal portion of fallopian tube.  The patient is status post an ectopic pregnancy and presumed that she had a left salpingectomy.  Fascia was then closed with a 2-0 Vicryl  suture and the skin was closed with  a subcuticular 4-0 Vicryl suture.  COMPLICATIONS:  There were no complications.  ESTIMATED BLOOD LOSS:  5 mL.  INTRAOPERATIVE FLUIDS:  500 mL.  DISPOSITION:  The patient tolerated the procedure well and was taken to recovery room in good condition.  VN/NUANCE  D:12/09/2019 T:12/09/2019 JOB:[id number]
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
## 2019-12-09 NOTE — Anesthesia Procedure Notes (Signed)
Spinal  Patient location during procedure: OR Start time: 12/09/2019 9:05 AM End time: 12/09/2019 9:12 AM Staffing Performed: anesthesiologist  Anesthesiologist: Emmie Niemann, MD Preanesthetic Checklist Completed: patient identified, IV checked, site marked, risks and benefits discussed, surgical consent, monitors and equipment checked, pre-op evaluation and timeout performed Spinal Block Patient position: sitting Prep: ChloraPrep Patient monitoring: heart rate, continuous pulse ox, blood pressure and cardiac monitor Approach: midline Location: L4-5 Injection technique: single-shot Needle Needle type: Introducer and Pencil-Tip  Needle gauge: 24 G Needle length: 9 cm Additional Notes Negative paresthesia. Negative blood return. Positive free-flowing CSF. Expiration date of kit checked and confirmed. Patient tolerated procedure well, without complications.

## 2019-12-09 NOTE — Transfer of Care (Signed)
Immediate Anesthesia Transfer of Care Note  Patient: [name] Young  Procedure(s) Performed: Procedure(s): POST PARTUM TUBAL LIGATION (Right)  Patient Location: PACU  Anesthesia Type:Spinal  Level of Consciousness: awake, alert  and oriented  Airway & Oxygen Therapy: Patient Spontanous Breathing and Patient connected to face mask oxygen  Post-op Assessment: Report given to RN and Post -op Vital signs reviewed and stable  Post vital signs: Reviewed and stable  Last Vitals:  Vitals:   12/09/19 0831 12/09/19 1021  BP: (!) 122/98 (!) 79/62  Pulse: (!) 55   Resp: 18 10  Temp: 36.7 C (!) 36.1 C  SpO2: 100% 100%    Complications: No apparent anesthesia complications

## 2019-12-09 NOTE — Anesthesia Postprocedure Evaluation (Signed)
Anesthesia Post Note  Patient: [name] Young  Procedure(s) Performed: POST PARTUM TUBAL LIGATION (Right )  Patient location during evaluation: PACU Anesthesia Type: Spinal Level of consciousness: oriented and awake and alert Pain management: pain level controlled Vital Signs Assessment: post-procedure vital signs reviewed and stable Respiratory status: spontaneous breathing, respiratory function stable and nonlabored ventilation Cardiovascular status: blood pressure returned to baseline and stable Postop Assessment: no headache, no backache and spinal receding Anesthetic complications: no   No complications documented.   Last Vitals:  Vitals:   12/09/19 1204 12/09/19 1233  BP: 130/79 (!) 130/102  Pulse: (!) 54 (!) 43  Resp: 16 16  Temp: (!) 36.2 C   SpO2: 100%     Last Pain:  Vitals:   12/09/19 1204  TempSrc:   PainSc: 0-No pain                 Ronne Savoia

## 2019-12-09 NOTE — Progress Notes (Signed)
Pre-procedure checklist complete. Patient transported to OR for postpartum tubal ligation at this time.     Inge Rise, RN

## 2019-12-09 NOTE — Anesthesia Postprocedure Evaluation (Signed)
Anesthesia Post Note  Patient: Finesse S Wolfson  Procedure(s) Performed: AN AD HOC LABOR EPIDURAL  Patient location during evaluation: Mother Baby Anesthesia Type: Epidural Level of consciousness: oriented and awake and alert Pain management: pain level controlled Vital Signs Assessment: post-procedure vital signs reviewed and stable Respiratory status: spontaneous breathing and respiratory function stable Cardiovascular status: blood pressure returned to baseline and stable Postop Assessment: no headache, no backache, no apparent nausea or vomiting and able to ambulate Anesthetic complications: no   No complications documented.   Last Vitals:  Vitals:   12/09/19 0010 12/09/19 0426  BP: 99/63 100/71  Pulse: (!) 58 (!) 46  Resp: 20 20  Temp: 36.8 C 36.9 C  SpO2: 97% 95%    Last Pain:  Vitals:   12/09/19 0228  TempSrc:   PainSc: Asleep                 Starling Manns

## 2019-12-09 NOTE — Progress Notes (Signed)
Patient back from PACU at this time. RN and nurse tech entered room to assess patient and patients vitals however patient irritable and non complaint. Patient would not sit still for vitals. Pt also refused an assessment and stated "im tired of people bothering me will you just get out of my room". Stated to patient we would leave room and to call me when she was ready to let me assess her.    Inge Rise, RN

## 2019-12-09 NOTE — Brief Op Note (Signed)
12/08/2019 - 12/09/2019  10:07 AM  PATIENT:  [name] Young  34 y.o. female  PRE-OPERATIVE DIAGNOSIS:  desired sterilization  POST-OPERATIVE DIAGNOSIS:  desired sterilization  PROCEDURE:  Procedure(s): POST PARTUM TUBAL LIGATION (Right)  SURGEON:  Surgeon(s) and Role:    * Elishua Radford, Ihor Austin, MD - Primary  PHYSICIAN ASSISTANT: cst  ASSISTANTS: none   ANESTHESIA:   general  EBL: ebl 5 cc . IOF 500 cc BLOOD ADMINISTERED:none  DRAINS: none   LOCAL MEDICATIONS USED:  MARCAINE     SPECIMEN:  Source of Specimen:  portion right fallopian tube   DISPOSITION OF SPECIMEN:  PATHOLOGY  COUNTS:  YES  TOURNIQUET:  * No tourniquets in log *  DICTATION: .Other Dictation: Dictation Number verbal  PLAN OF CARE: return to Postaprtum floor   PATIENT DISPOSITION:  PACU - hemodynamically stable.   Delay start of Pharmacological VTE agent (>24hrs) due to surgical blood loss or risk of bleeding: not applicable
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
## 2019-12-10 ENCOUNTER — Encounter: Payer: Self-pay | Admitting: Obstetrics and Gynecology

## 2019-12-12 LAB — SURGICAL PATHOLOGY

## 2019-12-15 ENCOUNTER — Other Ambulatory Visit: Payer: Medicaid Other

## 2019-12-20 ENCOUNTER — Inpatient Hospital Stay: Admit: 2019-12-20 | Payer: Self-pay

## 2019-12-22 ENCOUNTER — Other Ambulatory Visit: Payer: Medicaid Other

## 2020-10-04 ENCOUNTER — Emergency Department: Payer: Medicaid Other

## 2020-10-04 ENCOUNTER — Emergency Department
Admission: EM | Admit: 2020-10-04 | Discharge: 2020-10-04 | Disposition: A | Discharge status: Home / Self Care | Payer: Medicaid Other | Attending: Emergency Medicine | Admitting: Emergency Medicine

## 2020-10-04 ENCOUNTER — Other Ambulatory Visit: Payer: Self-pay

## 2020-10-04 ENCOUNTER — Encounter: Payer: Self-pay | Admitting: Emergency Medicine

## 2020-10-04 DIAGNOSIS — S62623A Displaced fracture of medial phalanx of left middle finger, initial encounter for closed fracture: Secondary | ICD-10-CM | POA: Diagnosis not present

## 2020-10-04 DIAGNOSIS — W19XXXA Unspecified fall, initial encounter: Secondary | ICD-10-CM | POA: Diagnosis not present

## 2020-10-04 DIAGNOSIS — Y9289 Other specified places as the place of occurrence of the external cause: Secondary | ICD-10-CM | POA: Diagnosis not present

## 2020-10-04 DIAGNOSIS — S60943A Unspecified superficial injury of left middle finger, initial encounter: Secondary | ICD-10-CM | POA: Diagnosis present

## 2020-10-04 DIAGNOSIS — F17210 Nicotine dependence, cigarettes, uncomplicated: Secondary | ICD-10-CM | POA: Insufficient documentation

## 2020-10-04 MED ORDER — IBUPROFEN 800 MG PO TABS
800.0000 mg | ORAL_TABLET | Freq: Three times a day (TID) | ORAL | 0 refills | Status: DC | PRN
Start: 2020-10-04 — End: 2020-12-02

## 2020-10-04 MED ORDER — HYDROCODONE-ACETAMINOPHEN 5-325 MG PO TABS
1.0000 | ORAL_TABLET | Freq: Once | ORAL | Status: AC
  Administered 2020-10-04: 1 via ORAL
  Filled 2020-10-04: qty 1

## 2020-10-04 MED ORDER — TRAMADOL HCL 50 MG PO TABS: 50.0000 mg | ORAL_TABLET | Freq: Four times a day (QID) | ORAL | 0 refills | Status: DC | PRN

## 2020-10-04 NOTE — Discharge Instructions (Addendum)
Follow-up with orthopedics for your finger.  Keep the area elevated and iced is much as possible.  Take the medication as prescribed.  Return if worsening

## 2020-10-04 NOTE — ED Triage Notes (Signed)
Pt comes into the Ed via POV c/o left middle finger pain s/p fall last night.  Pt has swelling noted to the finer.  Pt ambulatory to triage and in NAD.
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
## 2020-12-02 ENCOUNTER — Emergency Department
Admission: EM | Admit: 2020-12-02 | Discharge: 2020-12-02 | Disposition: A | Discharge status: Home / Self Care | Payer: Medicaid Other | Attending: Emergency Medicine | Admitting: Emergency Medicine

## 2020-12-02 ENCOUNTER — Other Ambulatory Visit: Payer: Self-pay

## 2020-12-02 DIAGNOSIS — J3489 Other specified disorders of nose and nasal sinuses: Secondary | ICD-10-CM | POA: Insufficient documentation

## 2020-12-02 DIAGNOSIS — F17210 Nicotine dependence, cigarettes, uncomplicated: Secondary | ICD-10-CM | POA: Insufficient documentation

## 2020-12-02 DIAGNOSIS — R519 Headache, unspecified: Secondary | ICD-10-CM | POA: Diagnosis not present

## 2020-12-02 MED ORDER — TRAMADOL HCL 50 MG PO TABS: 50.0000 mg | ORAL_TABLET | Freq: Four times a day (QID) | ORAL | 0 refills | Status: DC | PRN

## 2020-12-02 MED ORDER — IBUPROFEN 800 MG PO TABS
800.0000 mg | ORAL_TABLET | Freq: Three times a day (TID) | ORAL | 0 refills | Status: DC | PRN
Start: 2020-12-02 — End: 2021-08-05

## 2020-12-02 MED ORDER — TRAMADOL HCL 50 MG PO TABS
50.0000 mg | ORAL_TABLET | Freq: Once | ORAL | Status: AC
  Administered 2020-12-02: 50 mg via ORAL
  Filled 2020-12-02: qty 1
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
## 2020-12-02 NOTE — ED Triage Notes (Signed)
Pt c/o right sided facial pain and runny nose after [place] [employer] [place].

## 2020-12-02 NOTE — Discharge Instructions (Addendum)
Call your primary care provider Monday for a follow-up appointment.  Begin taking over-the-counter Sudafed and use Flonase nasal spray as needed for nasal congestion and also for sinus pressure and pain.  At this time there is no indication that you actually have an infection.  Discontinue or decrease smoking which will help.  Take medication only as directed.

## 2021-06-11 ENCOUNTER — Emergency Department
Admission: EM | Admit: 2021-06-11 | Discharge: 2021-06-11 | Disposition: A | Discharge status: Home / Self Care | Payer: Medicaid Other | Attending: Emergency Medicine | Admitting: Emergency Medicine

## 2021-06-11 ENCOUNTER — Encounter: Payer: Self-pay | Admitting: Emergency Medicine

## 2021-06-11 ENCOUNTER — Other Ambulatory Visit: Payer: Self-pay

## 2021-06-11 ENCOUNTER — Emergency Department: Payer: Medicaid Other

## 2021-06-11 DIAGNOSIS — R109 Unspecified abdominal pain: Secondary | ICD-10-CM | POA: Diagnosis not present

## 2021-06-11 DIAGNOSIS — M5440 Lumbago with sciatica, unspecified side: Secondary | ICD-10-CM

## 2021-06-11 DIAGNOSIS — M545 Low back pain, unspecified: Secondary | ICD-10-CM | POA: Diagnosis present

## 2021-06-11 DIAGNOSIS — M5442 Lumbago with sciatica, left side: Secondary | ICD-10-CM | POA: Insufficient documentation

## 2021-06-11 LAB — URINALYSIS, ROUTINE W REFLEX MICROSCOPIC
Bilirubin Urine: NEGATIVE
Glucose, UA: NEGATIVE mg/dL
Hgb urine dipstick: NEGATIVE
Ketones, ur: NEGATIVE mg/dL
Leukocytes,Ua: NEGATIVE
Nitrite: NEGATIVE
Protein, ur: NEGATIVE mg/dL
Specific Gravity, Urine: 1.028 (ref 1.005–1.030)
pH: 5 (ref 5.0–8.0)

## 2021-06-11 LAB — POC URINE PREG, ED: Preg Test, Ur: NEGATIVE

## 2021-06-11 MED ORDER — ONDANSETRON 4 MG PO TBDP
4.0000 mg | ORAL_TABLET | Freq: Once | ORAL | Status: AC
  Administered 2021-06-11: 4 mg via ORAL
  Filled 2021-06-11: qty 1

## 2021-06-11 MED ORDER — METHOCARBAMOL 500 MG PO TABS
500.0000 mg | ORAL_TABLET | Freq: Three times a day (TID) | ORAL | 0 refills | Status: AC | PRN
Start: 2021-06-11 — End: 2021-06-16

## 2021-06-11 MED ORDER — HYDROCODONE-ACETAMINOPHEN 5-325 MG PO TABS
1.0000 | ORAL_TABLET | Freq: Once | ORAL | Status: AC
  Administered 2021-06-11: 1 via ORAL
  Filled 2021-06-11: qty 1

## 2021-06-11 MED ORDER — PREDNISONE 10 MG (21) PO TBPK: ORAL_TABLET | ORAL | 0 refills | Status: DC

## 2021-06-11 NOTE — Discharge Instructions (Signed)
Take tapered prednisone as directed. ?Take Robaxin at night before bed. ?

## 2021-06-11 NOTE — ED Notes (Signed)
Pt to CT

## 2021-06-11 NOTE — ED Notes (Signed)
ED provider at bedside. Pt complains of L lower back pain that started this morning. Pt walking to bathroom for urine sample now.

## 2021-06-11 NOTE — ED Provider Notes (Signed)
[facility] Provider Note  Patient Contact: 3:46 PM (approximate)   History   Back Pain   HPI  [name] Young is a 36 y.o. female presents to the emergency department with acute left-sided low back pain that radiates into the left buttocks that started this morning.  Patient denies experiencing similar pain in the past.  States that she finished her menstrual cycle last week.  She is concerned about a kidney stone but denies a history of nephrolithiasis or pyelonephritis in the past.  Unsure about possibility of pregnancy.  Denies chest pain, chest tightness or abdominal pain.  No falls, mechanisms of trauma or heavy lifting.      Physical Exam   Triage Vital Signs: ED Triage Vitals  Enc Vitals Group     BP 06/11/21 1421 109/87     Pulse Rate 06/11/21 1421 (!) 56     Resp 06/11/21 1421 17     Temp 06/11/21 1421 98.8 F (37.1 C)     Temp Source 06/11/21 1421 Oral     SpO2 06/11/21 1421 100 %     Weight 06/11/21 1417 165 lb 5.5 oz (75 kg)     Height 06/11/21 1417 5\' 3"  (1.6 m)     Head Circumference --      Peak Flow --      Pain Score 06/11/21 1416 10     Pain Loc --      Pain Edu? --      Excl. in GC? --     Most recent vital signs: Vitals:   06/11/21 1421  BP: 109/87  Pulse: (!) 56  Resp: 17  Temp: 98.8 F (37.1 C)  SpO2: 100%     General: Alert and in no acute distress. Eyes:  PERRL. EOMI. Head: No acute traumatic findings ENT:      Ears:       Nose: No congestion/rhinnorhea.      Mouth/Throat: Mucous membranes are moist.  Neck: No stridor. No cervical spine tenderness to palpation. Cardiovascular:  Good peripheral perfusion Respiratory: Normal respiratory effort without tachypnea or retractions. Lungs CTAB. Good air entry to the bases with no decreased or absent breath sounds. Gastrointestinal: Bowel sounds 4 quadrants. Soft and nontender to palpation. No guarding or rigidity. No palpable masses. No distention. No CVA  tenderness. Musculoskeletal: Patient has paraspinal muscle tenderness along the left low back.  Neurologic:  No gross focal neurologic deficits are appreciated.  Skin:   No rash noted Other:   ED Results / Procedures / Treatments   Labs (all labs ordered are listed, but only abnormal results are displayed) Labs Reviewed  URINALYSIS, ROUTINE W REFLEX MICROSCOPIC - Abnormal; Notable for the following components:      Result Value   Color, Urine YELLOW (*)    APPearance CLEAR (*)    All other components within normal limits  POC URINE PREG, ED         PROCEDURES:  Critical Care performed: No  Procedures   MEDICATIONS ORDERED IN ED: Medications  HYDROcodone-acetaminophen (NORCO/VICODIN) 5-325 MG per tablet 1 tablet (1 tablet Oral Given 06/11/21 1609)  ondansetron (ZOFRAN-ODT) disintegrating tablet 4 mg (4 mg Oral Given 06/11/21 1609)     IMPRESSION / MDM / ASSESSMENT AND PLAN / ED COURSE  I reviewed the triage vital signs and the nursing notes.  Differential diagnosis includes, but is not limited to, lumbar strain, nephrolithiasis, UTI, ectopic pregnancy...  Assessment and plan Low back pain 36 year old female presents to the emergency department with left-sided low back pain that started this morning.  Patient was bradycardic at triage but vital signs were otherwise reassuring.  She was alert, active and nontoxic.  She had no neurodeficits on exam.   CT renal stone study showed no evidence of nephrolithiasis.  Urinalysis was not concerning for UTI.  Urine pregnancy test was negative.  Patient was given Norco in the emergency department for pain and discharged with prednisone and Robaxin.  FINAL CLINICAL IMPRESSION(S) / ED DIAGNOSES   Final diagnoses:  Acute left-sided low back pain with sciatica, sciatica laterality unspecified     Rx / DC Orders   ED Discharge Orders          Ordered    predniSONE (STERAPRED UNI-PAK 21 TAB) 10 MG  (21) TBPK tablet        06/11/21 1630    methocarbamol (ROBAXIN) 500 MG tablet  Every 8 hours PRN        06/11/21 1635             Note:  This document was prepared using Dragon voice recognition software and may include unintentional dictation errors.   Pia Mau [place], PA-C 06/11/21 1635    Minna Antis, MD 06/14/21 2100

## 2021-06-11 NOTE — ED Triage Notes (Signed)
Pt comes into the ED via [facility] from home c/o back pain.  Pt c/o pain on the left lower side at a 10/10.  Pt sinus brady at 56.  Pt states the back pain gets worse at the end of urination.  Denies any burning or increased urination.   100%  107/86

## 2021-08-05 ENCOUNTER — Emergency Department
Admission: EM | Admit: 2021-08-05 | Discharge: 2021-08-05 | Disposition: A | Discharge status: Home / Self Care | Payer: Medicaid Other | Attending: Emergency Medicine | Admitting: Emergency Medicine

## 2021-08-05 ENCOUNTER — Emergency Department: Payer: Medicaid Other

## 2021-08-05 ENCOUNTER — Other Ambulatory Visit: Payer: Self-pay

## 2021-08-05 DIAGNOSIS — F1490 Cocaine use, unspecified, uncomplicated: Secondary | ICD-10-CM

## 2021-08-05 DIAGNOSIS — F1419 Cocaine abuse with unspecified cocaine-induced disorder: Secondary | ICD-10-CM | POA: Diagnosis not present

## 2021-08-05 DIAGNOSIS — R079 Chest pain, unspecified: Secondary | ICD-10-CM

## 2021-08-05 DIAGNOSIS — R0789 Other chest pain: Secondary | ICD-10-CM | POA: Diagnosis present

## 2021-08-05 LAB — BASIC METABOLIC PANEL
Anion gap: 8 (ref 5–15)
BUN: 10 mg/dL (ref 6–20)
CO2: 24 mmol/L (ref 22–32)
Calcium: 8.9 mg/dL (ref 8.9–10.3)
Chloride: 103 mmol/L (ref 98–111)
Creatinine, Ser: 0.82 mg/dL (ref 0.44–1.00)
GFR, Estimated: >60 mL/min (ref >60)
Glucose, Bld: 103 mg/dL — ABNORMAL HIGH (ref 70–99)
Potassium: 3.7 mmol/L (ref 3.5–5.1)
Sodium: 135 mmol/L (ref 135–145)

## 2021-08-05 LAB — CBC
HCT: 38.3 % (ref 36.0–46.0)
Hemoglobin: 11.6 g/dL — ABNORMAL LOW (ref 12.0–15.0)
MCH: 26.9 pg (ref 26.0–34.0)
MCHC: 30.3 g/dL (ref 30.0–36.0)
MCV: 88.7 fL (ref 80.0–100.0)
Platelets: 287 10*3/uL (ref 150–400)
RBC: 4.32 MIL/uL (ref 3.87–5.11)
RDW: 13.5 % (ref 11.5–15.5)
WBC: 9.4 10*3/uL (ref 4.0–10.5)
nRBC: 0 % (ref 0.0–0.2)

## 2021-08-05 LAB — URINE DRUG SCREEN, QUALITATIVE (ARMC ONLY)
Amphetamines, Ur Screen: NOT DETECTED
Barbiturates, Ur Screen: NOT DETECTED
Benzodiazepine, Ur Scrn: NOT DETECTED
Cannabinoid 50 Ng, Ur ~~LOC~~: NOT DETECTED
Cocaine Metabolite,Ur ~~LOC~~: POSITIVE — AB
MDMA (Ecstasy)Ur Screen: NOT DETECTED
Methadone Scn, Ur: NOT DETECTED
Opiate, Ur Screen: NOT DETECTED
Phencyclidine (PCP) Ur S: NOT DETECTED
Tricyclic, Ur Screen: NOT DETECTED

## 2021-08-05 LAB — URINALYSIS, ROUTINE W REFLEX MICROSCOPIC
Bilirubin Urine: NEGATIVE
Glucose, UA: NEGATIVE mg/dL
Hgb urine dipstick: NEGATIVE
Ketones, ur: NEGATIVE mg/dL
Leukocytes,Ua: NEGATIVE
Nitrite: NEGATIVE
Protein, ur: NEGATIVE mg/dL
Specific Gravity, Urine: 1.009 (ref 1.005–1.030)
pH: 9 — ABNORMAL HIGH (ref 5.0–8.0)

## 2021-08-05 LAB — TROPONIN I (HIGH SENSITIVITY): Troponin I (High Sensitivity): 3 ng/L (ref <18)

## 2021-08-05 LAB — POC URINE PREG, ED: Preg Test, Ur: NEGATIVE

## 2021-08-05 MED ORDER — NITROGLYCERIN 0.4 MG SL SUBL
0.4000 mg | SUBLINGUAL_TABLET | SUBLINGUAL | Status: DC | PRN
  Administered 2021-08-05: 0.4 mg via SUBLINGUAL
  Filled 2021-08-05: qty 1

## 2021-08-05 MED ORDER — LORAZEPAM 1 MG PO TABS
1.0000 mg | ORAL_TABLET | Freq: Once | ORAL | Status: AC
  Administered 2021-08-05: 1 mg via ORAL
  Filled 2021-08-05: qty 1

## 2021-08-05 MED ORDER — HYDROXYZINE HCL 25 MG PO TABS: 25.0000 mg | ORAL_TABLET | Freq: Three times a day (TID) | ORAL | 0 refills | Status: DC | PRN

## 2021-08-05 NOTE — ED Triage Notes (Addendum)
Pt c/o chest pain since last night with SOB. Pt is anxious in triage. Respirations WNL. Used cocaine last night ?

## 2021-08-05 NOTE — ED Notes (Signed)
States she feels better  she is ready to go   ?

## 2021-08-05 NOTE — ED Provider Notes (Signed)
? ?[facility] ?Provider Note ? ? ? Event Date/Time  ? First MD Initiated Contact with Patient 08/05/21 1003   ?  (approximate) ? ? ?History  ? ?Chest Pain ? ? ?HPI ? ?[name] Young is a 36 y.o. female with a past history of bipolar disorder and cocaine use who comes ED complaining of chest pain after cocaine use last night. She used cocaine around 3:00am, and then started having central chest tightness around 4:00am which has been constant since then. No aggravating/alleviating factors. Not exertional/pleuritic. Non radiating. No vomiting, diaphoresis, or shortness of breath. No dizziness/palpitations/syncope. ?  ? ? ?Physical Exam  ? ?Triage Vital Signs: ?ED Triage Vitals [08/05/21 0937]  ?Enc Vitals Group  ?   BP 137/90  ?   Pulse Rate 74  ?   Resp 18  ?   Temp 97.7 ?F (36.5 ?C)  ?   Temp Source Oral  ?   SpO2 100 %  ?   Weight 173 lb (78.5 kg)  ?   Height 5\' 2"  (1.575 m)  ?   Head Circumference   ?   Peak Flow   ?   Pain Score 10  ?   Pain Loc   ?   Pain Edu?   ?   Excl. in GC?   ? ? ?Most recent vital signs: ?Vitals:  ? 08/05/21 0937  ?BP: 137/90  ?Pulse: 74  ?Resp: 18  ?Temp: 97.7 ?F (36.5 ?C)  ?SpO2: 100%  ? ? ? ?General: Awake, no distress.  ?CV:  Good peripheral perfusion. rrr ?Resp:  Normal effort. ctab ?Abd:  No distention. Soft nt nd. No r/r/g ?Other:  No rash. MMM ? ? ?ED Results / Procedures / Treatments  ? ?Labs ?(all labs ordered are listed, but only abnormal results are displayed) ?Labs Reviewed  ?BASIC METABOLIC PANEL - Abnormal; Notable for the following components:  ?    Result Value  ? Glucose, Bld 103 (*)   ? All other components within normal limits  ?CBC - Abnormal; Notable for the following components:  ? Hemoglobin 11.6 (*)   ? All other components within normal limits  ?URINE DRUG SCREEN, QUALITATIVE ([facility] ONLY) - Abnormal; Notable for the following components:  ? Cocaine Metabolite,[place] POSITIVE (*)   ? All other components within normal limits  ?URINALYSIS,  ROUTINE W REFLEX MICROSCOPIC - Abnormal; Notable for the following components:  ? Color, Urine YELLOW (*)   ? APPearance CLEAR (*)   ? pH 9.0 (*)   ? All other components within normal limits  ?POC URINE PREG, ED  ?TROPONIN I (HIGH SENSITIVITY)  ?TROPONIN I (HIGH SENSITIVITY)  ? ? ? ?EKG ? ?Interpreted by me ?NSR rate 80. Nl axis and intervals. Isolated q wave in lead III with assoc. TWI. No ST changes. Compared to prior EKG in 2020, no significant change. ? ? ?RADIOLOGY ?Chest xray viewed and interpreted by me, appears normal.  Radiology report reviewed ? ? ? ?PROCEDURES: ? ?Critical Care performed: No ? ?Procedures ? ? ?MEDICATIONS ORDERED IN ED: ?Medications  ?nitroGLYCERIN (NITROSTAT) SL tablet 0.4 mg (0.4 mg Sublingual Given 08/05/21 1055)  ?LORazepam (ATIVAN) tablet 1 mg (1 mg Oral Given 08/05/21 1027)  ? ? ? ?IMPRESSION / MDM / ASSESSMENT AND PLAN / ED COURSE  ?I reviewed the triage vital signs and the nursing notes. ?             ?               ? ?  Differential diagnosis includes, but is not limited to, anxiety, cocaine-chest pain, nstemi, gerd ? ?Pt p/w nonspecific chest pain x 6 hours. Ekg, cxr, labs all unremarkable. Pt given ativan and feeling better. Pain relieved by a single SLN. Will dc with hydroxyzine, counseled on CV effects of cocaine use and recommended cessation/seeking treatment. ? ? Considering the patient's symptoms, medical history, and physical examination today, I have low suspicion for ACS, PE, TAD, pneumothorax, carditis, mediastinitis, pneumonia, CHF, or sepsis. ? ? ? ?  ? ? ?FINAL CLINICAL IMPRESSION(S) / ED DIAGNOSES  ? ?Final diagnoses:  ?Nonspecific chest pain  ?Cocaine use  ? ? ? ?Rx / DC Orders  ? ?ED Discharge Orders   ? ?      Ordered  ?  hydrOXYzine (ATARAX) 25 MG tablet  3 times daily PRN       ? 08/05/21 1137  ? ?  ?  ? ?  ? ? ? ?Note:  This document was prepared using Dragon voice recognition software and may include unintentional dictation errors. ?  ?Sharman Cheek,  MD ?08/05/21 1146 ? ?

## 2021-10-15 ENCOUNTER — Emergency Department
Admission: EM | Admit: 2021-10-15 | Discharge: 2021-10-16 | Disposition: A | Discharge status: Home / Self Care | Payer: Medicaid Other | Attending: Emergency Medicine | Admitting: Emergency Medicine

## 2021-10-15 ENCOUNTER — Encounter: Payer: Self-pay | Admitting: *Deleted

## 2021-10-15 ENCOUNTER — Other Ambulatory Visit: Payer: Self-pay

## 2021-10-15 DIAGNOSIS — Z20822 Contact with and (suspected) exposure to covid-19: Secondary | ICD-10-CM | POA: Diagnosis not present

## 2021-10-15 DIAGNOSIS — J029 Acute pharyngitis, unspecified: Secondary | ICD-10-CM | POA: Insufficient documentation

## 2021-10-15 DIAGNOSIS — H66002 Acute suppurative otitis media without spontaneous rupture of ear drum, left ear: Secondary | ICD-10-CM | POA: Diagnosis not present

## 2021-10-15 DIAGNOSIS — H9202 Otalgia, left ear: Secondary | ICD-10-CM | POA: Diagnosis present

## 2021-10-15 LAB — RESP PANEL BY RT-PCR (FLU A&B, COVID) ARPGX2
Influenza A by PCR: NEGATIVE
Influenza B by PCR: NEGATIVE
SARS Coronavirus 2 by RT PCR: NEGATIVE

## 2021-10-15 LAB — GROUP A STREP BY PCR: Group A Strep by PCR: NOT DETECTED

## 2021-10-15 MED ORDER — AMOXICILLIN-POT CLAVULANATE 875-125 MG PO TABS
1.0000 | ORAL_TABLET | Freq: Once | ORAL | Status: AC
Start: 2021-10-15 — End: 2021-10-15
  Administered 2021-10-15: 1 via ORAL
  Filled 2021-10-15: qty 1

## 2021-10-15 MED ORDER — DEXAMETHASONE SODIUM PHOSPHATE 10 MG/ML IJ SOLN
10.0000 mg | Freq: Once | INTRAMUSCULAR | Status: AC
Start: 2021-10-15 — End: 2021-10-15
  Administered 2021-10-15: 10 mg via INTRAMUSCULAR
  Filled 2021-10-15: qty 1

## 2021-10-15 MED ORDER — IBUPROFEN 600 MG PO TABS: 600.0000 mg | ORAL_TABLET | Freq: Three times a day (TID) | ORAL | 0 refills | Status: DC | PRN

## 2021-10-15 MED ORDER — HYDROCODONE-ACETAMINOPHEN 5-325 MG PO TABS: 1.0000 | ORAL_TABLET | Freq: Four times a day (QID) | ORAL | 0 refills | Status: AC | PRN

## 2021-10-15 MED ORDER — AMOXICILLIN-POT CLAVULANATE 875-125 MG PO TABS: 1.0000 | ORAL_TABLET | Freq: Two times a day (BID) | ORAL | 0 refills | Status: AC

## 2021-10-15 MED ORDER — KETOROLAC TROMETHAMINE 60 MG/2ML IM SOLN
60.0000 mg | Freq: Once | INTRAMUSCULAR | Status: AC
  Administered 2021-10-15: 60 mg via INTRAMUSCULAR
  Filled 2021-10-15: qty 2

## 2021-10-15 MED ORDER — HYDROCODONE-ACETAMINOPHEN 5-325 MG PO TABS
2.0000 | ORAL_TABLET | Freq: Once | ORAL | Status: AC
  Administered 2021-10-15: 2 via ORAL
  Filled 2021-10-15: qty 2

## 2021-10-15 NOTE — ED Notes (Signed)
ED Provider at bedside. 

## 2021-10-15 NOTE — ED Provider Notes (Signed)
[facility] Provider Note    Event Date/Time   First MD Initiated Contact with Patient 10/15/21 2147     (approximate)   History   Sore Throat   HPI  [name] Young is a 36 y.o. female  here with sore throat and L ear pain. Pt states sx started about 2 days ago with sore throat and aching, throbbing L ear pain. The ear pain has been severe, limiting her ability to sleep. She has tried OTC meds w/o relief. She's also had worsening sore throat, worse w/ swallowing. No difficulty swallowing. No SOB. No h/o prior ear infections. No h/o recurrent sinusitis. No known sick contacts.       Physical Exam   Triage Vital Signs: ED Triage Vitals  Enc Vitals Group     BP 10/15/21 2125 108/76     Pulse Rate 10/15/21 2125 63     Resp 10/15/21 2125 16     Temp 10/15/21 2125 98.7 F (37.1 C)     Temp Source 10/15/21 2125 Oral     SpO2 10/15/21 2125 100 %     Weight 10/15/21 2125 174 lb (78.9 kg)     Height 10/15/21 2125 5\' 3"  (1.6 m)     Head Circumference --      Peak Flow --      Pain Score 10/15/21 2128 9     Pain Loc --      Pain Edu? --      Excl. in GC? --     Most recent vital signs: Vitals:   10/15/21 2125  BP: 108/76  Pulse: 63  Resp: 16  Temp: 98.7 F (37.1 C)  SpO2: 100%     General: Awake, no distress.  CV:  Good peripheral perfusion. RRR. No murmurs. Resp:  Normal effort. Lungs CTAB. No stridor. Abd:  No distention.  Other:  Moderate tonsillar swelling that is symmetric, with no exudates, bilaterally. Uvula is midline. No peritonsillar swelling or asymmetry. Bilateral effusions of TM, left with mild opacity and erythema. No drainage. EAC normal b/l. TMs intact.   ED Results / Procedures / Treatments   Labs (all labs ordered are listed, but only abnormal results are displayed) Labs Reviewed  GROUP A STREP BY PCR  RESP PANEL BY RT-PCR (FLU A&B, COVID) [id number]     EKG    RADIOLOGY    I also independently reviewed and  agree with radiologist interpretations.   PROCEDURES:  Critical Care performed: No   MEDICATIONS ORDERED IN ED: Medications  dexamethasone (DECADRON) injection 10 mg (10 mg Intramuscular Given 10/15/21 2249)  ketorolac (TORADOL) injection 60 mg (60 mg Intramuscular Given 10/15/21 2248)  HYDROcodone-acetaminophen (NORCO/VICODIN) 5-325 MG per tablet 2 tablet (2 tablets Oral Given 10/15/21 2247)  amoxicillin-clavulanate (AUGMENTIN) 875-125 MG per tablet 1 tablet (1 tablet Oral Given 10/15/21 2247)     IMPRESSION / MDM / ASSESSMENT AND PLAN / ED COURSE  I reviewed the triage vital signs and the nursing notes.                             Ddx:  Differential includes the following, with pertinent life- or limb-threatening emergencies considered:  AOM, pharyngitis, post-nasal drainage, otalgia, dental/referred dental pain, sinusitis  Patient's presentation is most consistent with acute illness / injury with system symptoms.  MDM:  36 yo F here with sore throat, L ear pain. Exam is c/w acute otitis media. On  OP exam, tonsillar swelling noted but no signs of PTA, RPA, or airway compromise. EAC is normal. No HA, neck stiffness, AMS, or signs of meningitis or encephalitis. Pt is HDS, non toxic. She is not diabetic or immunosuppressed.  Will place on Augmentin, give dose of Decadron here, and d/c with outpt follow-up.   MEDICATIONS GIVEN IN ED: Medications  dexamethasone (DECADRON) injection 10 mg (10 mg Intramuscular Given 10/15/21 2249)  ketorolac (TORADOL) injection 60 mg (60 mg Intramuscular Given 10/15/21 2248)  HYDROcodone-acetaminophen (NORCO/VICODIN) 5-325 MG per tablet 2 tablet (2 tablets Oral Given 10/15/21 2247)  amoxicillin-clavulanate (AUGMENTIN) 875-125 MG per tablet 1 tablet (1 tablet Oral Given 10/15/21 2247)     Consults:     EMR reviewed       FINAL CLINICAL IMPRESSION(S) / ED DIAGNOSES   Final diagnoses:  Non-recurrent acute suppurative otitis media of left ear  without spontaneous rupture of tympanic membrane     Rx / DC Orders   ED Discharge Orders          Ordered    amoxicillin-clavulanate (AUGMENTIN) 875-125 MG tablet  2 times daily        10/15/21 2306    HYDROcodone-acetaminophen (NORCO/VICODIN) 5-325 MG tablet  Every 6 hours PRN        10/15/21 2306    ibuprofen (ADVIL) 600 MG tablet  Every 8 hours PRN        10/15/21 2306             Note:  This document was prepared using Dragon voice recognition software and may include unintentional dictation errors.   Shaune Pollack, MD 10/15/21 [phone]

## 2021-10-15 NOTE — ED Triage Notes (Signed)
Pt has sore throat and left earache since this am.   Pt took tylenol without relief.  Pt alert.

## 2021-10-16 NOTE — ED Notes (Signed)
Pt discharged by EDP isaacs

## 2021-11-27 ENCOUNTER — Emergency Department
Admission: EM | Admit: 2021-11-27 | Discharge: 2021-11-27 | Disposition: A | Discharge status: Other Acute Inpatient Hospital | Payer: Medicaid Other | Attending: Emergency Medicine | Admitting: Emergency Medicine

## 2021-11-27 ENCOUNTER — Other Ambulatory Visit: Payer: Self-pay

## 2021-11-27 DIAGNOSIS — F17210 Nicotine dependence, cigarettes, uncomplicated: Secondary | ICD-10-CM | POA: Insufficient documentation

## 2021-11-27 DIAGNOSIS — Z046 Encounter for general psychiatric examination, requested by authority: Secondary | ICD-10-CM | POA: Diagnosis present

## 2021-11-27 DIAGNOSIS — F1490 Cocaine use, unspecified, uncomplicated: Secondary | ICD-10-CM

## 2021-11-27 DIAGNOSIS — F310 Bipolar disorder, current episode hypomanic: Secondary | ICD-10-CM

## 2021-11-27 DIAGNOSIS — Z20822 Contact with and (suspected) exposure to covid-19: Secondary | ICD-10-CM | POA: Diagnosis not present

## 2021-11-27 DIAGNOSIS — F319 Bipolar disorder, unspecified: Secondary | ICD-10-CM | POA: Diagnosis not present

## 2021-11-27 DIAGNOSIS — Y900 Blood alcohol level of less than 20 mg/100 ml: Secondary | ICD-10-CM | POA: Insufficient documentation

## 2021-11-27 DIAGNOSIS — F329 Major depressive disorder, single episode, unspecified: Secondary | ICD-10-CM

## 2021-11-27 DIAGNOSIS — F1429 Cocaine dependence with unspecified cocaine-induced disorder: Secondary | ICD-10-CM | POA: Insufficient documentation

## 2021-11-27 DIAGNOSIS — F1420 Cocaine dependence, uncomplicated: Secondary | ICD-10-CM | POA: Diagnosis present

## 2021-11-27 DIAGNOSIS — G4700 Insomnia, unspecified: Secondary | ICD-10-CM | POA: Insufficient documentation

## 2021-11-27 LAB — URINE DRUG SCREEN, QUALITATIVE (ARMC ONLY)
Amphetamines, Ur Screen: NOT DETECTED
Barbiturates, Ur Screen: NOT DETECTED
Benzodiazepine, Ur Scrn: NOT DETECTED
Cannabinoid 50 Ng, Ur ~~LOC~~: NOT DETECTED
Cocaine Metabolite,Ur ~~LOC~~: POSITIVE — AB
MDMA (Ecstasy)Ur Screen: NOT DETECTED
Methadone Scn, Ur: NOT DETECTED
Opiate, Ur Screen: NOT DETECTED
Phencyclidine (PCP) Ur S: NOT DETECTED
Tricyclic, Ur Screen: NOT DETECTED

## 2021-11-27 LAB — COMPREHENSIVE METABOLIC PANEL
ALT: 13 U/L (ref 0–44)
AST: 19 U/L (ref 15–41)
Albumin: 3.9 g/dL (ref 3.5–5.0)
Alkaline Phosphatase: 74 U/L (ref 38–126)
Anion gap: 7 (ref 5–15)
BUN: 15 mg/dL (ref 6–20)
CO2: 24 mmol/L (ref 22–32)
Calcium: 8.6 mg/dL — ABNORMAL LOW (ref 8.9–10.3)
Chloride: 108 mmol/L (ref 98–111)
Creatinine, Ser: 1.06 mg/dL — ABNORMAL HIGH (ref 0.44–1.00)
GFR, Estimated: >60 mL/min (ref >60)
Glucose, Bld: 125 mg/dL — ABNORMAL HIGH (ref 70–99)
Potassium: 3.6 mmol/L (ref 3.5–5.1)
Sodium: 139 mmol/L (ref 135–145)
Total Bilirubin: 0.5 mg/dL (ref 0.3–1.2)
Total Protein: 6.8 g/dL (ref 6.5–8.1)

## 2021-11-27 LAB — CBC
HCT: 37.4 % (ref 36.0–46.0)
Hemoglobin: 11.5 g/dL — ABNORMAL LOW (ref 12.0–15.0)
MCH: 26.7 pg (ref 26.0–34.0)
MCHC: 30.7 g/dL (ref 30.0–36.0)
MCV: 86.8 fL (ref 80.0–100.0)
Platelets: 263 10*3/uL (ref 150–400)
RBC: 4.31 MIL/uL (ref 3.87–5.11)
RDW: 14.9 % (ref 11.5–15.5)
WBC: 7.7 10*3/uL (ref 4.0–10.5)
nRBC: 0 % (ref 0.0–0.2)

## 2021-11-27 LAB — POC URINE PREG, ED: Preg Test, Ur: NEGATIVE

## 2021-11-27 LAB — ETHANOL: Alcohol, Ethyl (B): <10 mg/dL (ref <10)

## 2021-11-27 LAB — SALICYLATE LEVEL: Salicylate Lvl: <7 mg/dL — ABNORMAL LOW (ref 7.0–30.0)

## 2021-11-27 LAB — ACETAMINOPHEN LEVEL: Acetaminophen (Tylenol), Serum: <10 ug/mL — ABNORMAL LOW (ref 10–30)

## 2021-11-27 LAB — SARS CORONAVIRUS 2 BY RT PCR: SARS Coronavirus 2 by RT PCR: NEGATIVE

## 2021-11-27 MED ORDER — HYDROXYZINE HCL 25 MG PO TABS
25.0000 mg | ORAL_TABLET | Freq: Once | ORAL | Status: AC
  Administered 2021-11-27: 25 mg via ORAL
  Filled 2021-11-27: qty 1

## 2021-11-27 MED ORDER — LORAZEPAM 2 MG PO TABS
2.0000 mg | ORAL_TABLET | Freq: Once | ORAL | Status: AC
  Administered 2021-11-27: 2 mg via ORAL
  Filled 2021-11-27: qty 1

## 2021-11-27 NOTE — BH Assessment (Signed)
Patient has been accepted to [facility].  Patient assigned to unit: [facility] Accepting physician is Dr. Forrestine Him.  Call report to [phone] or [phone].  Representative was [name].   ER Staff is aware of it:  Drinda Butts, ER Secretary  Dr. Scotty Court, ER MD  [name], Patient's Nurse
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
## 2021-11-27 NOTE — ED Notes (Addendum)
Black flip flops Jean shorts  Pink underwear Bra Black shirt Cell phone Charger and small purse

## 2021-11-27 NOTE — ED Provider Notes (Signed)
Procedures     ----------------------------------------- 3:17 PM on 11/27/2021 -----------------------------------------  Psychiatry plans to admit. Accepted to [facility], Aneta Mins, MD 11/27/21 [phone]
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
## 2021-11-27 NOTE — ED Notes (Signed)
Spoke with admissions at [facility] to provide additional medical Hx.  Admission stated they will c/b if accepted

## 2021-11-27 NOTE — ED Notes (Signed)
Pt continues to be verbally aggressive and demanding with staff.  Pt requested to use phone to have someone bring outside food and/or drinks.  Staff attempted to explain that outside food and drinks are not allowed in the behavioral area(s).  Pt became irate loudly speaking/cursing at staff.  [facility] PD officer redirected pt and notified her that she would have to control her tone in the ED that she was causing a disturbance for other pt.s.

## 2021-11-27 NOTE — ED Notes (Addendum)
Pt got up and started walking quickly down the hallway after the ED doctor.  Staff attempted to redirect patient back into hallway chair.  Pt became agitated and was demanding to know where her "nerve pill" was.  ED MD then went to pt to assess, she wanted to know how long she would be waiting to be seen.  Pt then told ED MD to "go find out then you come and let me know".  Pt stated to nursing staff "I don't like the way your fucking face looks. Get away from me, I came here on my own and I can do what ever the fuck I wanna do"  Staff removed self from situation.

## 2021-11-27 NOTE — ED Provider Notes (Signed)
[facility] Provider Note    Event Date/Time   First MD Initiated Contact with Patient 11/27/21 [phone]     (approximate)   History   Chief Complaint: Psychiatric Evaluation   HPI  [name] Young is a 36 y.o. female with a past history of bipolar disorder, cocaine use who comes the ED complaining of agitation, occasional suicidal thoughts without plan, poor sleep, poor appetite.  No hallucinations or HI.  She attributes this to DSS removing her children from her home 3 months ago.  Last used cocaine 3 days ago     Physical Exam   Triage Vital Signs: ED Triage Vitals  Enc Vitals Group     BP 11/27/21 0857 122/84     Pulse Rate 11/27/21 0857 64     Resp 11/27/21 0857 20     Temp 11/27/21 0857 98.8 F (37.1 C)     Temp Source 11/27/21 0857 Oral     SpO2 11/27/21 0857 98 %     Weight 11/27/21 0858 175 lb (79.4 kg)     Height 11/27/21 0858 5\' 2"  (1.575 m)     Head Circumference --      Peak Flow --      Pain Score 11/27/21 0858 0     Pain Loc --      Pain Edu? --      Excl. in GC? --     Most recent vital signs: Vitals:   11/27/21 0857  BP: 122/84  Pulse: 64  Resp: 20  Temp: 98.8 F (37.1 C)  SpO2: 98%    General: Awake, no distress.  CV:  Good peripheral perfusion.  Resp:  Normal effort.  Abd:  No distention.  Other:  Oriented x3.  Ambulatory with steady gait.  Linear and rational thought process   ED Results / Procedures / Treatments   Labs (all labs ordered are listed, but only abnormal results are displayed) Labs Reviewed  COMPREHENSIVE METABOLIC PANEL - Abnormal; Notable for the following components:      Result Value   Glucose, Bld 125 (*)    Creatinine, Ser 1.06 (*)    Calcium 8.6 (*)    All other components within normal limits  SALICYLATE LEVEL - Abnormal; Notable for the following components:   Salicylate Lvl <7.0 (*)    All other components within normal limits  ACETAMINOPHEN LEVEL - Abnormal; Notable for the  following components:   Acetaminophen (Tylenol), Serum <10 (*)    All other components within normal limits  CBC - Abnormal; Notable for the following components:   Hemoglobin 11.5 (*)    All other components within normal limits  URINE DRUG SCREEN, QUALITATIVE ([facility] ONLY) - Abnormal; Notable for the following components:   Cocaine Metabolite,Ur [place] POSITIVE (*)    All other components within normal limits  ETHANOL  POC URINE PREG, ED     EKG    RADIOLOGY    PROCEDURES:  Procedures   MEDICATIONS ORDERED IN ED: Medications  hydrOXYzine (ATARAX) tablet 25 mg (25 mg Oral Given 11/27/21 1046)     IMPRESSION / MDM / ASSESSMENT AND PLAN / ED COURSE  I reviewed the triage vital signs and the nursing notes.                               Patient presents with symptoms of depression.  Not an imminent danger to herself or others, not  committable.  She is medically stable.  We will request psychiatry evaluation due to her relatively severe symptoms and cocaine use.       FINAL CLINICAL IMPRESSION(S) / ED DIAGNOSES   Final diagnoses:  Major depressive disorder with current active episode, unspecified depression episode severity, unspecified whether recurrent  Cocaine use     Rx / DC Orders   ED Discharge Orders     None        Note:  This document was prepared using Dragon voice recognition software and may include unintentional dictation errors.   Sharman Cheek, MD 11/27/21 1245

## 2021-11-27 NOTE — ED Triage Notes (Signed)
Pt states her behavior has been changing for few months states becomes agitated easily. States no hx of the same, denies any SI or HI. States has seen psychiatry in the past but not for same.

## 2022-01-11 ENCOUNTER — Emergency Department: Payer: Medicaid Other

## 2022-01-11 ENCOUNTER — Emergency Department
Admission: EM | Admit: 2022-01-11 | Discharge: 2022-01-11 | Disposition: A | Discharge status: Home / Self Care | Payer: Medicaid Other | Attending: Emergency Medicine | Admitting: Emergency Medicine

## 2022-01-11 DIAGNOSIS — Z5321 Procedure and treatment not carried out due to patient leaving prior to being seen by health care provider: Secondary | ICD-10-CM | POA: Insufficient documentation

## 2022-01-11 DIAGNOSIS — S0990XA Unspecified injury of head, initial encounter: Secondary | ICD-10-CM | POA: Diagnosis present

## 2022-01-11 DIAGNOSIS — W228XXA Striking against or struck by other objects, initial encounter: Secondary | ICD-10-CM | POA: Insufficient documentation

## 2022-01-11 DIAGNOSIS — S0181XA Laceration without foreign body of other part of head, initial encounter: Secondary | ICD-10-CM | POA: Insufficient documentation
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
## 2022-12-06 ENCOUNTER — Other Ambulatory Visit: Payer: Self-pay

## 2022-12-06 ENCOUNTER — Emergency Department
Admission: EM | Admit: 2022-12-06 | Discharge: 2022-12-06 | Disposition: A | Discharge status: Home / Self Care | Payer: MEDICAID | Attending: Student in an Organized Health Care Education/Training Program | Admitting: Student in an Organized Health Care Education/Training Program

## 2022-12-06 DIAGNOSIS — F17200 Nicotine dependence, unspecified, uncomplicated: Secondary | ICD-10-CM | POA: Diagnosis not present

## 2022-12-06 DIAGNOSIS — R110 Nausea: Secondary | ICD-10-CM | POA: Diagnosis not present

## 2022-12-06 LAB — URINALYSIS, ROUTINE W REFLEX MICROSCOPIC
Bilirubin Urine: NEGATIVE
Glucose, UA: NEGATIVE mg/dL
Hgb urine dipstick: NEGATIVE
Ketones, ur: NEGATIVE mg/dL
Leukocytes,Ua: NEGATIVE
Nitrite: NEGATIVE
Protein, ur: NEGATIVE mg/dL
Specific Gravity, Urine: 1.02 (ref 1.005–1.030)
pH: 6 (ref 5.0–8.0)

## 2022-12-06 LAB — POC URINE PREG, ED: Preg Test, Ur: NEGATIVE

## 2022-12-06 NOTE — Discharge Instructions (Signed)
You did not want any further workup today.  Your urinalysis is normal and your urine pregnancy is negative.  Please return for any new, worsening, or change in symptoms other concerns or if you do want to do more testing.  It was a pleasure caring for you today.

## 2022-12-06 NOTE — ED Provider Notes (Signed)
[facility] Provider Note    Event Date/Time   First MD Initiated Contact with Patient 12/06/22 1216     (approximate)   History   Nausea   HPI  [name] Young is a 37 y.o. female with a past medical history of bipolar disorder, substance use who presents today for nausea that she had yesterday, but it has resolved today.  She reports feeling back to normal today.  She did not have any vomiting.  She has not had any urinary symptoms.  She does not believe that she is pregnant.  She has not had any fevers or chills.  She does not want any further workup today.  She is requesting a work note.  Patient Active Problem List   Diagnosis Date Noted   NSVD (normal spontaneous vaginal delivery) 12/09/2019   Supervision of high risk pregnancy in third trimester 01/12/2018   Anemia affecting pregnancy 10/05/2017   Bipolar disorder (HCC) 07/21/2016   Domestic problems 02/06/2016   Smoker 02/06/2016   Cocaine use disorder, moderate, dependence (HCC) 01/16/2016   Bipolar disorder, curr episode mixed, severe, with psychotic features (HCC) 01/15/2016          Physical Exam   Triage Vital Signs: ED Triage Vitals [12/06/22 1213]  Encounter Vitals Group     BP 114/85     Systolic BP Percentile      Diastolic BP Percentile      Pulse Rate 85     Resp 18     Temp 99 F (37.2 C)     Temp src      SpO2 97 %     Weight 158 lb (71.7 kg)     Height 5\' 2"  (1.575 m)     Head Circumference      Peak Flow      Pain Score 0     Pain Loc      Pain Education      Exclude from Growth Chart     Most recent vital signs: Vitals:   12/06/22 1213  BP: 114/85  Pulse: 85  Resp: 18  Temp: 99 F (37.2 C)  SpO2: 97%    Physical Exam Vitals and nursing note reviewed.  Constitutional:      General: Awake and alert. No acute distress.  Speaking on her telephone    Appearance: Normal appearance. The patient is overweight.  HENT:     Head: Normocephalic and  atraumatic.     Mouth: Mucous membranes are moist.  Eyes:     General: PERRL. Normal EOMs        Right eye: No discharge.        Left eye: No discharge.     Conjunctiva/sclera: Conjunctivae normal.  Cardiovascular:     Rate and Rhythm: Normal rate and regular rhythm.     Pulses: Normal pulses.  Pulmonary:     Effort: Pulmonary effort is normal. No respiratory distress.     Breath sounds: Normal breath sounds.  Abdominal:     Abdomen is soft. There is no abdominal tenderness. No rebound or guarding. No distention. Musculoskeletal:        General: No swelling. Normal range of motion.     Cervical back: Normal range of motion and neck supple.  Skin:    General: Skin is warm and dry.     Capillary Refill: Capillary refill takes less than 2 seconds.     Findings: No rash.  Neurological:  Mental Status: The patient is awake and alert.      ED Results / Procedures / Treatments   Labs (all labs ordered are listed, but only abnormal results are displayed) Labs Reviewed  URINALYSIS, ROUTINE W REFLEX MICROSCOPIC - Abnormal; Notable for the following components:      Result Value   Color, Urine YELLOW (*)    APPearance CLEAR (*)    All other components within normal limits  POC URINE PREG, ED     EKG     RADIOLOGY     PROCEDURES:  Critical Care performed:   Procedures   MEDICATIONS ORDERED IN ED: Medications - No data to display   IMPRESSION / MDM / ASSESSMENT AND PLAN / ED COURSE  I reviewed the triage vital signs and the nursing notes.   Differential diagnosis includes, but is not limited to, gastroenteritis, pregnancy, intra-abdominal infection/etiology.  Patient is awake and alert, hemodynamically stable and afebrile.  She is nontoxic in appearance.  She has no abdominal tenderness.  She reports her symptoms have resolved.  She is requesting a work note only.  She is declining any further workup.  Urinalysis is normal, patient is not pregnant.  She is  reassured by these findings.  She does not want any blood work or imaging today.  She is requesting a work note only.  She also declines medications.  I did discuss with her that I might be missing another etiology of her symptoms without further workup, however she declines.  She reports that she will return if her symptoms change or return.  We discussed return precautions and the importance of close outpatient follow-up.  Patient understands and agrees with plan.  She was discharged in stable condition.   Patient's presentation is most consistent with acute complicated illness / injury requiring diagnostic workup.    FINAL CLINICAL IMPRESSION(S) / ED DIAGNOSES   Final diagnoses:  Nausea     Rx / DC Orders   ED Discharge Orders     None        Note:  This document was prepared using Dragon voice recognition software and may include unintentional dictation errors.   Keturah Shavers 12/06/22 1359    Willy Eddy, MD 12/06/22 [phone]

## 2022-12-06 NOTE — ED Notes (Signed)
Pt presents to ED on her phone and was seen to be giving registration desk a hard time when pt was asked to verify address for her. When this RN brought pt to triage this RN asked "are you done with you're phone call so I can triage you" pt started to become louder and started to yell at this RN stating "there is no reason I need to get off my phone but if that's big of a deal I will" Pt educated this RN would not triage her until she was on the phone.

## 2022-12-06 NOTE — ED Triage Notes (Signed)
Pt to ED for nausea started yesterday. Pt talking on phone ignoring staff.  NAD noted

## 2022-12-15 ENCOUNTER — Emergency Department
Admission: EM | Admit: 2022-12-15 | Discharge: 2022-12-15 | Disposition: A | Discharge status: Home / Self Care | Payer: MEDICAID | Attending: Emergency Medicine | Admitting: Emergency Medicine

## 2022-12-15 ENCOUNTER — Other Ambulatory Visit: Payer: Self-pay

## 2022-12-15 DIAGNOSIS — F17200 Nicotine dependence, unspecified, uncomplicated: Secondary | ICD-10-CM | POA: Diagnosis not present

## 2022-12-15 DIAGNOSIS — Z1152 Encounter for screening for COVID-19: Secondary | ICD-10-CM | POA: Diagnosis not present

## 2022-12-15 DIAGNOSIS — J029 Acute pharyngitis, unspecified: Secondary | ICD-10-CM | POA: Diagnosis present

## 2022-12-15 LAB — RESP PANEL BY RT-PCR (RSV, FLU A&B, COVID)  RVPGX2
Influenza A by PCR: NEGATIVE
Influenza B by PCR: NEGATIVE
Resp Syncytial Virus by PCR: NEGATIVE
SARS Coronavirus 2 by RT PCR: NEGATIVE

## 2022-12-15 LAB — GROUP A STREP BY PCR: Group A Strep by PCR: NOT DETECTED

## 2022-12-15 NOTE — ED Provider Notes (Signed)
[facility] Provider Note    Event Date/Time   First MD Initiated Contact with Patient 12/15/22 1753     (approximate)   History   Sore Throat   HPI  [name] Young is a 37 y.o. female who presents today for evaluation of scratchy throat for the past 2 to 3 days.  She was sent in by her job for COVID and strep testing.  Patient reports that she also has a dry cough.  No fevers or chills.  No voice change.  No trouble swallowing or breathing.  She is requesting a work note.  Patient Active Problem List   Diagnosis Date Noted   NSVD (normal spontaneous vaginal delivery) 12/09/2019   Supervision of high risk pregnancy in third trimester 01/12/2018   Anemia affecting pregnancy 10/05/2017   Bipolar disorder (HCC) 07/21/2016   Domestic problems 02/06/2016   Smoker 02/06/2016   Cocaine use disorder, moderate, dependence (HCC) 01/16/2016   Bipolar disorder, curr episode mixed, severe, with psychotic features (HCC) 01/15/2016          Physical Exam   Triage Vital Signs: ED Triage Vitals  Encounter Vitals Group     BP 12/15/22 1651 (!) 125/90     Systolic BP Percentile --      Diastolic BP Percentile --      Pulse Rate 12/15/22 1651 94     Resp 12/15/22 1651 20     Temp 12/15/22 1651 98.6 F (37 C)     Temp Source 12/15/22 1651 Oral     SpO2 12/15/22 1651 98 %     Weight 12/15/22 1652 165 lb (74.8 kg)     Height 12/15/22 1652 5\' 3"  (1.6 m)     Head Circumference --      Peak Flow --      Pain Score 12/15/22 1652 7     Pain Loc --      Pain Education --      Exclude from Growth Chart --     Most recent vital signs: Vitals:   12/15/22 1651  BP: (!) 125/90  Pulse: 94  Resp: 20  Temp: 98.6 F (37 C)  SpO2: 98%    Physical Exam Vitals and nursing note reviewed.  Constitutional:      General: Awake and alert. No acute distress.    Appearance: Normal appearance. The patient is normal weight.  HENT:     Head: Normocephalic and  atraumatic.     Mouth: Mucous membranes are moist. Uvula midline.  No tonsillar exudate.  No soft palate fluctuance.  No trismus.  No voice change.  No sublingual swelling.  No tender cervical lymphadenopathy.  No nuchal rigidity Eyes:     General: PERRL. Normal EOMs        Right eye: No discharge.        Left eye: No discharge.     Conjunctiva/sclera: Conjunctivae normal.  Cardiovascular:     Rate and Rhythm: Normal rate and regular rhythm.     Pulses: Normal pulses.  Pulmonary:     Effort: Pulmonary effort is normal. No respiratory distress.     Breath sounds: Normal breath sounds.  Abdominal:     Abdomen is soft. There is no abdominal tenderness. No rebound or guarding. No distention. Musculoskeletal:        General: No swelling. Normal range of motion.     Cervical back: Normal range of motion and neck supple.  Skin:    General:  Skin is warm and dry.     Capillary Refill: Capillary refill takes less than 2 seconds.     Findings: No rash.  Neurological:     Mental Status: The patient is awake and alert.      ED Results / Procedures / Treatments   Labs (all labs ordered are listed, but only abnormal results are displayed) Labs Reviewed  GROUP A STREP BY PCR  RESP PANEL BY RT-PCR (RSV, FLU A&B, COVID)  [id number]     EKG     RADIOLOGY     PROCEDURES:  Critical Care performed:   Procedures   MEDICATIONS ORDERED IN ED: Medications - No data to display   IMPRESSION / MDM / ASSESSMENT AND PLAN / ED COURSE  I reviewed the triage vital signs and the nursing notes.   Differential diagnosis includes, but is not limited to, COVID, strep, viral pharyngitis.  Patient is awake and alert, hemodynamically stable and afebrile.  She is nontoxic in appearance.  Uvula is midline, no tonsillar exudate.  She has no trismus, voice change, drooling, neck pain or stiffness.  Not consistent with peritonsillar or retropharyngeal abscess.  COVID and strep swabs were negative.   Patient is reassured by this.  She is requesting a work note.  This was provided.  We discussed return precautions and outpatient follow-up.  Patient understands and agrees with plan.  She was discharged in stable condition.   Patient's presentation is most consistent with acute complicated illness / injury requiring diagnostic workup.      FINAL CLINICAL IMPRESSION(S) / ED DIAGNOSES   Final diagnoses:  Pharyngitis, unspecified etiology     Rx / DC Orders   ED Discharge Orders     None        Note:  This document was prepared using Dragon voice recognition software and may include unintentional dictation errors.   Keturah Shavers 12/15/22 Nida Boatman    Pilar Jarvis, MD 12/15/22 2258

## 2022-12-15 NOTE — ED Triage Notes (Signed)
Pt arrives via POV w/ c/o sore throat x2-3 days. Pt wants to be tested for covid and strep throat. Sent by her job. Pt also endorsing cough.

## 2022-12-15 NOTE — Discharge Instructions (Signed)
Your COVID and strep test were negative.  You may take Tylenol/ibuprofen per package instructions to help with your symptoms.  Please return for any new, worsening, or change in symptoms or other concerns.  It was a pleasure caring for you today.

## 2023-02-08 ENCOUNTER — Encounter: Payer: Self-pay | Admitting: Intensive Care

## 2023-02-08 ENCOUNTER — Other Ambulatory Visit: Payer: Self-pay

## 2023-02-08 ENCOUNTER — Emergency Department
Admission: EM | Admit: 2023-02-08 | Discharge: 2023-02-08 | Disposition: A | Discharge status: Home / Self Care | Payer: MEDICAID | Attending: Emergency Medicine | Admitting: Emergency Medicine

## 2023-02-08 DIAGNOSIS — R197 Diarrhea, unspecified: Secondary | ICD-10-CM | POA: Insufficient documentation

## 2023-02-08 DIAGNOSIS — F17200 Nicotine dependence, unspecified, uncomplicated: Secondary | ICD-10-CM | POA: Diagnosis not present

## 2023-02-08 NOTE — ED Triage Notes (Addendum)
Patient c/o diarrhea that started yesterday. Denies fevers. Denies any pain

## 2023-02-08 NOTE — Discharge Instructions (Signed)
You did not wish to have any workup done today.  Please return if you develop abdominal pain, dizziness, fevers, chills, bloody stool, vomiting, or any other new, worsening, or change in symptoms or other concerns.  It was a pleasure caring for you today.

## 2023-02-08 NOTE — ED Provider Notes (Signed)
[facility] Provider Note    Event Date/Time   First MD Initiated Contact with Patient 02/08/23 0745     (approximate)   History   Diarrhea   HPI  [name] Young is a 37 y.o. female who presents today for evaluation of diarrhea.  Patient reports this began yesterday.  She reports that she is having 2-3 loose stools per day.  She has not had any blood in her stool.  She denies having any abdominal pain.  She has not had any fever or chills.  No nausea or vomiting.  She reports that she works around a lot of people but has not been able to identify any specific sick contacts.  She has not been on any recent antibiotics.  She has not traveled recently.  She has not been swimming in any lakes or ponds recently.  No urinary symptoms.  No vaginal discharge.  She is requesting a work note and does not want any other workup today.  Patient Active Problem List   Diagnosis Date Noted   NSVD (normal spontaneous vaginal delivery) 12/09/2019   Supervision of high risk pregnancy in third trimester 01/12/2018   Anemia affecting pregnancy 10/05/2017   Bipolar disorder (HCC) 07/21/2016   Domestic problems 02/06/2016   Smoker 02/06/2016   Cocaine use disorder, moderate, dependence (HCC) 01/16/2016   Bipolar disorder, curr episode mixed, severe, with psychotic features (HCC) 01/15/2016          Physical Exam   Triage Vital Signs: ED Triage Vitals  Encounter Vitals Group     BP 02/08/23 0743 108/79     Systolic BP Percentile --      Diastolic BP Percentile --      Pulse Rate 02/08/23 0743 74     Resp 02/08/23 0743 16     Temp 02/08/23 0743 98.4 F (36.9 C)     Temp Source 02/08/23 0743 Oral     SpO2 02/08/23 0743 99 %     Weight 02/08/23 0740 167 lb 11.2 oz (76.1 kg)     Height 02/08/23 0740 5\' 3"  (1.6 m)     Head Circumference --      Peak Flow --      Pain Score 02/08/23 0740 0     Pain Loc --      Pain Education --      Exclude from Growth Chart --      Most recent vital signs: Vitals:   02/08/23 0743  BP: 108/79  Pulse: 74  Resp: 16  Temp: 98.4 F (36.9 C)  SpO2: 99%    Physical Exam Vitals and nursing note reviewed.  Constitutional:      General: Awake and alert. No acute distress.    Appearance: Normal appearance. The patient is overweight.  HENT:     Head: Normocephalic and atraumatic.     Mouth: Mucous membranes are moist.  Eyes:     General: PERRL. Normal EOMs        Right eye: No discharge.        Left eye: No discharge.     Conjunctiva/sclera: Conjunctivae normal.  Cardiovascular:     Rate and Rhythm: Normal rate and regular rhythm.     Pulses: Normal pulses.  Pulmonary:     Effort: Pulmonary effort is normal. No respiratory distress.     Breath sounds: Normal breath sounds.  Abdominal:     Abdomen is soft. There is no abdominal tenderness. No rebound or guarding.  No distention. Musculoskeletal:        General: No swelling. Normal range of motion.     Cervical back: Normal range of motion and neck supple.  Skin:    General: Skin is warm and dry.     Capillary Refill: Capillary refill takes less than 2 seconds.     Findings: No rash.  Neurological:     Mental Status: The patient is awake and alert.      ED Results / Procedures / Treatments   Labs (all labs ordered are listed, but only abnormal results are displayed) Labs Reviewed - No data to display   EKG     RADIOLOGY     PROCEDURES:  Critical Care performed:   Procedures   MEDICATIONS ORDERED IN ED: Medications - No data to display   IMPRESSION / MDM / ASSESSMENT AND PLAN / ED COURSE  I reviewed the triage vital signs and the nursing notes.   Differential diagnosis includes, but is not limited to, gastroenteritis, electrolyte disarray, dehydration.  I reviewed the patient's chart.  Patient has had multiple previous visits to the emergency department, most recently in August at which time she had a URI.  The time before  that on 12/06/22 she also did not want any workup and requested a work note only.  Patient is awake and alert, hemodynamically stable and afebrile.  She is nontoxic in appearance.  I recommended further workup including blood work, urinalysis, possible imaging, the patient declines any workup, reports that she wants a work note only.    Patient is overall well-appearing. No abdominal pain or tenderness. Vomiting and diarrhea are non-bloody. Patient is hemodynamically stable. No history of immunosuppression, no other red flags such as recent travel, sick contacts or recent antibiotic use. She is tolerating oral intake. Differential diagnosis is broad however without focal abdominal tenderness, do not suspect that the patient requires urgent imaging or has surgical process in abdomen. Given that patient has a paucity of red flags for the diarrhea as it pertains to patient's past medical history and history of present illness, no indication for further observation or empiric antibiotic treatment. Discussed care plan, return precautions, and advised close outpatient follow-up. Patient agrees with plan of care.    Patient's presentation is most consistent with acute complicated illness / injury requiring diagnostic workup.      FINAL CLINICAL IMPRESSION(S) / ED DIAGNOSES   Final diagnoses:  Diarrhea, unspecified type     Rx / DC Orders   ED Discharge Orders     None        Note:  This document was prepared using Dragon voice recognition software and may include unintentional dictation errors.   Jackelyn Hoehn, PA-C 02/08/23 9629    Concha Se, MD 02/08/23 1322

## 2023-02-23 ENCOUNTER — Emergency Department
Admission: EM | Admit: 2023-02-23 | Discharge: 2023-02-23 | Disposition: A | Discharge status: Home / Self Care | Payer: MEDICAID | Attending: Emergency Medicine | Admitting: Emergency Medicine

## 2023-02-23 ENCOUNTER — Other Ambulatory Visit: Payer: Self-pay

## 2023-02-23 DIAGNOSIS — M542 Cervicalgia: Secondary | ICD-10-CM | POA: Insufficient documentation

## 2023-02-23 DIAGNOSIS — R1310 Dysphagia, unspecified: Secondary | ICD-10-CM | POA: Diagnosis not present

## 2023-02-23 MED ORDER — TRAMADOL HCL 50 MG PO TABS: 50.0000 mg | ORAL_TABLET | Freq: Four times a day (QID) | ORAL | 0 refills | Status: DC | PRN

## 2023-02-23 NOTE — ED Notes (Signed)
Per MD no orders at this time. [facility] nurse contacted to see if their services are applicable
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
## 2023-05-07 ENCOUNTER — Emergency Department: Payer: MEDICAID

## 2023-05-07 ENCOUNTER — Other Ambulatory Visit: Payer: Self-pay

## 2023-05-07 ENCOUNTER — Emergency Department
Admission: EM | Admit: 2023-05-07 | Discharge: 2023-05-07 | Disposition: A | Discharge status: Home / Self Care | Payer: MEDICAID | Attending: Emergency Medicine | Admitting: Emergency Medicine

## 2023-05-07 ENCOUNTER — Encounter: Payer: Self-pay | Admitting: Emergency Medicine

## 2023-05-07 DIAGNOSIS — N3001 Acute cystitis with hematuria: Secondary | ICD-10-CM | POA: Insufficient documentation

## 2023-05-07 DIAGNOSIS — R350 Frequency of micturition: Secondary | ICD-10-CM | POA: Diagnosis present

## 2023-05-07 LAB — URINALYSIS, ROUTINE W REFLEX MICROSCOPIC
Bilirubin Urine: NEGATIVE
Glucose, UA: NEGATIVE mg/dL
Ketones, ur: NEGATIVE mg/dL
Nitrite: NEGATIVE
Protein, ur: 100 mg/dL — AB
RBC / HPF: >50 RBC/hpf (ref 0–5)
Specific Gravity, Urine: 1.015 (ref 1.005–1.030)
WBC, UA: >50 WBC/hpf (ref 0–5)
pH: 7 (ref 5.0–8.0)

## 2023-05-07 LAB — CBC WITH DIFFERENTIAL/PLATELET
Abs Immature Granulocytes: 0.05 10*3/uL (ref 0.00–0.07)
Basophils Absolute: 0 10*3/uL (ref 0.0–0.1)
Basophils Relative: 0 %
Eosinophils Absolute: 0.1 10*3/uL (ref 0.0–0.5)
Eosinophils Relative: 1 %
HCT: 36.3 % (ref 36.0–46.0)
Hemoglobin: 11.6 g/dL — ABNORMAL LOW (ref 12.0–15.0)
Immature Granulocytes: 0 %
Lymphocytes Relative: 21 %
Lymphs Abs: 2.7 10*3/uL (ref 0.7–4.0)
MCH: 27 pg (ref 26.0–34.0)
MCHC: 32 g/dL (ref 30.0–36.0)
MCV: 84.6 fL (ref 80.0–100.0)
Monocytes Absolute: 1 10*3/uL (ref 0.1–1.0)
Monocytes Relative: 7 %
Neutro Abs: 9.1 10*3/uL — ABNORMAL HIGH (ref 1.7–7.7)
Neutrophils Relative %: 71 %
Platelets: 272 10*3/uL (ref 150–400)
RBC: 4.29 MIL/uL (ref 3.87–5.11)
RDW: 13.9 % (ref 11.5–15.5)
WBC: 13 10*3/uL — ABNORMAL HIGH (ref 4.0–10.5)
nRBC: 0 % (ref 0.0–0.2)

## 2023-05-07 LAB — COMPREHENSIVE METABOLIC PANEL
ALT: 15 U/L (ref 0–44)
AST: 25 U/L (ref 15–41)
Albumin: 4 g/dL (ref 3.5–5.0)
Alkaline Phosphatase: 60 U/L (ref 38–126)
Anion gap: 11 (ref 5–15)
BUN: 12 mg/dL (ref 6–20)
CO2: 22 mmol/L (ref 22–32)
Calcium: 8.8 mg/dL — ABNORMAL LOW (ref 8.9–10.3)
Chloride: 103 mmol/L (ref 98–111)
Creatinine, Ser: 0.87 mg/dL (ref 0.44–1.00)
GFR, Estimated: >60 mL/min (ref >60)
Glucose, Bld: 89 mg/dL (ref 70–99)
Potassium: 4.8 mmol/L (ref 3.5–5.1)
Sodium: 136 mmol/L (ref 135–145)
Total Bilirubin: 0.7 mg/dL (ref 0.0–1.2)
Total Protein: 7.2 g/dL (ref 6.5–8.1)

## 2023-05-07 LAB — PREGNANCY, URINE: Preg Test, Ur: NEGATIVE

## 2023-05-07 MED ORDER — PHENAZOPYRIDINE HCL 100 MG PO TABS: 100.0000 mg | ORAL_TABLET | Freq: Three times a day (TID) | ORAL | 0 refills | Status: DC | PRN

## 2023-05-07 MED ORDER — IBUPROFEN 600 MG PO TABS
600.0000 mg | ORAL_TABLET | Freq: Once | ORAL | Status: AC
  Administered 2023-05-07: 600 mg via ORAL
  Filled 2023-05-07: qty 1

## 2023-05-07 MED ORDER — FENTANYL CITRATE PF 50 MCG/ML IJ SOSY
50.0000 ug | PREFILLED_SYRINGE | Freq: Once | INTRAMUSCULAR | Status: AC
  Administered 2023-05-07: 50 ug via INTRAVENOUS
  Filled 2023-05-07: qty 1

## 2023-05-07 MED ORDER — CEPHALEXIN 500 MG PO CAPS: 500.0000 mg | ORAL_CAPSULE | Freq: Three times a day (TID) | ORAL | 0 refills | Status: AC

## 2023-05-07 MED ORDER — OXYCODONE HCL 5 MG PO TABS
5.0000 mg | ORAL_TABLET | Freq: Once | ORAL | Status: AC
  Administered 2023-05-07: 5 mg via ORAL
  Filled 2023-05-07: qty 1

## 2023-05-07 MED ORDER — TRAMADOL HCL 50 MG PO TABS
50.0000 mg | ORAL_TABLET | Freq: Once | ORAL | Status: AC
  Administered 2023-05-07: 50 mg via ORAL
  Filled 2023-05-07: qty 1

## 2023-05-07 MED ORDER — ONDANSETRON HCL 4 MG/2ML IJ SOLN
4.0000 mg | Freq: Once | INTRAMUSCULAR | Status: AC
  Administered 2023-05-07: 4 mg via INTRAVENOUS
  Filled 2023-05-07: qty 2

## 2023-05-07 NOTE — ED Provider Notes (Signed)
 [facility] Provider Note    Event Date/Time   First MD Initiated Contact with Patient 05/07/23 [phone]     (approximate)   History   Constipation   HPI  [name] Young is a 38 y.o. female with history of bipolar disorder, cocaine abuse, anemia and as listed in EMR presents to the emergency department for treatment and evaluation of low back pain and urinary frequency.  She states that her last bowel movement was yesterday and it was normal but today she feels like she is constantly having to have a bowel movement, but when she tries nothing comes out.  Symptoms have been intermittent over the past couple of days.  She denies known fever.      Physical Exam   Triage Vital Signs: ED Triage Vitals  Encounter Vitals Group     BP 05/07/23 0822 126/85     Systolic BP Percentile --      Diastolic BP Percentile --      Pulse Rate 05/07/23 0822 85     Resp 05/07/23 0822 18     Temp 05/07/23 0822 98.5 F (36.9 C)     Temp src --      SpO2 05/07/23 0822 100 %     Weight 05/07/23 0821 180 lb (81.6 kg)     Height 05/07/23 0821 5' 3 (1.6 m)     Head Circumference --      Peak Flow --      Pain Score 05/07/23 0821 10     Pain Loc --      Pain Education --      Exclude from Growth Chart --     Most recent vital signs: Vitals:   05/07/23 0822  BP: 126/85  Pulse: 85  Resp: 18  Temp: 98.5 F (36.9 C)  SpO2: 100%    General: Awake, no distress.  CV:  Good peripheral perfusion.  Resp:  Normal effort.  Abd:  No distention.  Abdomen is soft. Other:  Bilateral flank pain.  No CVA tenderness.   ED Results / Procedures / Treatments   Labs (all labs ordered are listed, but only abnormal results are displayed) Labs Reviewed  URINALYSIS, ROUTINE W REFLEX MICROSCOPIC - Abnormal; Notable for the following components:      Result Value   Color, Urine YELLOW (*)    APPearance CLOUDY (*)    Hgb urine dipstick MODERATE (*)    Protein, ur 100 (*)     Leukocytes,Ua LARGE (*)    Bacteria, UA FEW (*)    All other components within normal limits  COMPREHENSIVE METABOLIC PANEL - Abnormal; Notable for the following components:   Calcium  8.8 (*)    All other components within normal limits  CBC WITH DIFFERENTIAL/PLATELET - Abnormal; Notable for the following components:   WBC 13.0 (*)    Hemoglobin 11.6 (*)    Neutro Abs 9.1 (*)    All other components within normal limits  CHLAMYDIA/NGC RT PCR ([facility] ONLY)            PREGNANCY, URINE     EKG  Not indicated   RADIOLOGY  Image and radiology report reviewed and interpreted by me. Radiology report consistent with the same.  CT renal stone study negative for acute concerns.  PROCEDURES:  Critical Care performed: No  Procedures   MEDICATIONS ORDERED IN ED:  Medications  oxyCODONE  (Oxy IR/ROXICODONE ) immediate release tablet 5 mg (has no administration in time range)  ondansetron  (  ZOFRAN ) injection 4 mg (4 mg Intravenous Given 05/07/23 1016)  fentaNYL  (SUBLIMAZE ) injection 50 mcg (50 mcg Intravenous Given 05/07/23 1017)  ibuprofen  (ADVIL ) tablet 600 mg (600 mg Oral Given 05/07/23 1157)  traMADol  (ULTRAM ) tablet 50 mg (50 mg Oral Given 05/07/23 1426)     IMPRESSION / MDM / ASSESSMENT AND PLAN / ED COURSE   I have reviewed the triage note.  Differential diagnosis includes, but is not limited to, pyelonephritis, kidney stone, UTI, constipation, PID  Patient's presentation is most consistent with acute illness / injury with system symptoms.  38 year old female presenting to the emergency department for treatment and evaluation of flank pain and sensation of constipation despite having a bowel movement yesterday.  See HPI for further details.  While awaiting ER room assignment, urinalysis was collected.  Results show moderate hemoglobin, protein, large number of leukocytes, greater than 50 white blood cells, greater than 50 red blood cells, few bacteria, and white blood cell clumps.   On exam, she has no CVA tenderness and abdomen is soft and nontender. Plan will be to get CT abdomen without contrast and add STI testing to urine in lab.  ----------------------------------------- 2:21 PM on 05/07/2023 ----------------------------------------- Continue to await results of CT. Patient complaining of pain. Tramadol  ordered.  ----------------------------------------- 3:17 PM on 05/07/2023 ----------------------------------------- CT results reviewed. All results discussed with patient. Will discharge her home with Keflex  and pyridium . She states that she is still in pain and needs something stronger. She will be given a single dose of oxycodone  prior to discharge.        FINAL CLINICAL IMPRESSION(S) / ED DIAGNOSES   Final diagnoses:  Acute cystitis with hematuria     Rx / DC Orders   ED Discharge Orders          Ordered    cephALEXin  (KEFLEX ) 500 MG capsule  3 times daily        05/07/23 1516    phenazopyridine  (PYRIDIUM ) 100 MG tablet  3 times daily PRN        05/07/23 1516             Note:  This document was prepared using Dragon voice recognition software and may include unintentional dictation errors.   Herlinda Kirk NOVAK, FNP 05/07/23 1519    Levander Slate, MD 05/07/23 1524

## 2023-05-07 NOTE — ED Triage Notes (Signed)
 Patient to ED via POV for constipation. Last BM yesterday. States she has lower back pain and urinary frequency. Denies burning with urination.

## 2023-08-03 ENCOUNTER — Ambulatory Visit (INDEPENDENT_AMBULATORY_CARE_PROVIDER_SITE_OTHER): Payer: MEDICAID | Admitting: Obstetrics

## 2023-08-03 ENCOUNTER — Encounter: Payer: Self-pay | Admitting: Obstetrics

## 2023-08-03 ENCOUNTER — Other Ambulatory Visit (HOSPITAL_COMMUNITY)
Admission: RE | Admit: 2023-08-03 | Discharge: 2023-08-03 | Disposition: A | Discharge status: Home / Self Care | Payer: MEDICAID | Source: Ambulatory Visit | Attending: Obstetrics | Admitting: Obstetrics

## 2023-08-03 VITALS — BP 117/83 | HR 80 | Ht 63.0 in | Wt 187.0 lb

## 2023-08-03 DIAGNOSIS — Z124 Encounter for screening for malignant neoplasm of cervix: Secondary | ICD-10-CM

## 2023-08-03 DIAGNOSIS — Z113 Encounter for screening for infections with a predominantly sexual mode of transmission: Secondary | ICD-10-CM

## 2023-08-03 DIAGNOSIS — N898 Other specified noninflammatory disorders of vagina: Secondary | ICD-10-CM | POA: Diagnosis not present
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
## 2023-08-04 LAB — HEP, RPR, HIV PANEL
HIV Screen 4th Generation wRfx: NONREACTIVE
Hepatitis B Surface Ag: NEGATIVE
RPR Ser Ql: NONREACTIVE

## 2023-08-05 LAB — CYTOLOGY - PAP
Adequacy: ABSENT
Chlamydia: NEGATIVE
Comment: NEGATIVE
Comment: NEGATIVE
Comment: NEGATIVE
Comment: NORMAL
Diagnosis: NEGATIVE
Diagnosis: REACTIVE
High risk HPV: NEGATIVE
Neisseria Gonorrhea: NEGATIVE
Trichomonas: NEGATIVE

## 2023-08-06 ENCOUNTER — Encounter: Payer: Self-pay | Admitting: Obstetrics

## 2023-08-06 ENCOUNTER — Other Ambulatory Visit: Payer: Self-pay | Admitting: Obstetrics

## 2023-08-06 MED ORDER — METRONIDAZOLE 500 MG PO TABS: 500.0000 mg | ORAL_TABLET | Freq: Two times a day (BID) | ORAL | 0 refills | Status: DC

## 2023-08-06 NOTE — Progress Notes (Signed)
+  BV. Rx for metronidazole 500 mg PO BID x 7 days sent to pharmacy. Jacki notified via MyChart.  M. Chryl Heck, CNM

## 2023-08-07 LAB — MYCOPLASMA / UREAPLASMA CULTURE
Mycoplasma hominis Culture: POSITIVE — AB
Ureaplasma urealyticum: POSITIVE — AB

## 2023-08-07 MED ORDER — DOXYCYCLINE HYCLATE 100 MG PO TABS: 100.0000 mg | ORAL_TABLET | Freq: Two times a day (BID) | ORAL | 0 refills | Status: AC

## 2023-08-26 ENCOUNTER — Encounter: Payer: Self-pay | Admitting: Obstetrics and Gynecology

## 2023-08-26 ENCOUNTER — Ambulatory Visit (INDEPENDENT_AMBULATORY_CARE_PROVIDER_SITE_OTHER): Payer: MEDICAID | Admitting: Obstetrics and Gynecology

## 2023-08-26 DIAGNOSIS — Z3169 Encounter for other general counseling and advice on procreation: Secondary | ICD-10-CM

## 2023-08-26 DIAGNOSIS — N971 Female infertility of tubal origin: Secondary | ICD-10-CM
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
## 2023-08-26 NOTE — Progress Notes (Signed)
 Patient presents today to discuss pregnancy following salpingectomy. She has a history of left salpingectomy and partial right salpingectomy. Today she is wanting to discuss the possibility of an HSG and possible regrowth of a fallopian tube.

## 2023-09-14 ENCOUNTER — Telehealth: Payer: Self-pay | Admitting: Obstetrics and Gynecology

## 2023-09-14 NOTE — Telephone Encounter (Signed)
 Patient called to schedule HSG. LMP: 09/08/2023. Awaiting coordination with MD for scheduling.

## 2023-09-15 ENCOUNTER — Telehealth: Payer: Self-pay | Admitting: Obstetrics and Gynecology

## 2023-09-15 NOTE — Telephone Encounter (Signed)
 Due to availability with radiology and provider, dates fall outside of window for HSG.  Pt has been made aware to contact our office at the start(first day) of next cycle so that we may coordinate appt at that time.

## 2023-09-22 ENCOUNTER — Other Ambulatory Visit: Payer: Self-pay

## 2023-09-22 DIAGNOSIS — N971 Female infertility of tubal origin: Secondary | ICD-10-CM

## 2023-09-30 ENCOUNTER — Emergency Department: Payer: MEDICAID

## 2023-09-30 ENCOUNTER — Emergency Department
Admission: EM | Admit: 2023-09-30 | Discharge: 2023-09-30 | Disposition: A | Discharge status: Home / Self Care | Payer: MEDICAID | Attending: Emergency Medicine | Admitting: Emergency Medicine

## 2023-09-30 ENCOUNTER — Other Ambulatory Visit: Payer: Self-pay

## 2023-09-30 DIAGNOSIS — J069 Acute upper respiratory infection, unspecified: Secondary | ICD-10-CM | POA: Diagnosis not present

## 2023-09-30 DIAGNOSIS — J029 Acute pharyngitis, unspecified: Secondary | ICD-10-CM | POA: Diagnosis not present

## 2023-09-30 DIAGNOSIS — R0981 Nasal congestion: Secondary | ICD-10-CM | POA: Diagnosis present

## 2023-09-30 DIAGNOSIS — Z716 Tobacco abuse counseling: Secondary | ICD-10-CM | POA: Insufficient documentation

## 2023-09-30 LAB — RESP PANEL BY RT-PCR (RSV, FLU A&B, COVID)  RVPGX2
Influenza A by PCR: NEGATIVE
Influenza B by PCR: NEGATIVE
Resp Syncytial Virus by PCR: NEGATIVE
SARS Coronavirus 2 by RT PCR: NEGATIVE

## 2023-09-30 LAB — GROUP A STREP BY PCR: Group A Strep by PCR: NOT DETECTED

## 2023-09-30 MED ORDER — NICOTINE POLACRILEX 4 MG MT LOZG: 4.0000 mg | LOZENGE | OROMUCOSAL | 3 refills | Status: DC | PRN

## 2023-09-30 MED ORDER — DEXAMETHASONE 4 MG PO TABS
8.0000 mg | ORAL_TABLET | Freq: Once | ORAL | Status: AC
  Administered 2023-09-30: 8 mg via ORAL
  Filled 2023-09-30 (×2): qty 2

## 2023-09-30 MED ORDER — ACETAMINOPHEN 500 MG PO TABS
1000.0000 mg | ORAL_TABLET | Freq: Once | ORAL | Status: AC
  Administered 2023-09-30: 1000 mg via ORAL
  Filled 2023-09-30: qty 2

## 2023-09-30 MED ORDER — IBUPROFEN 600 MG PO TABS
600.0000 mg | ORAL_TABLET | Freq: Once | ORAL | Status: AC
  Administered 2023-09-30: 600 mg via ORAL
  Filled 2023-09-30: qty 1

## 2023-09-30 MED ORDER — LIDOCAINE VISCOUS HCL 2 % MT SOLN: 10.0000 mL | Freq: Three times a day (TID) | OROMUCOSAL | 0 refills | Status: DC | PRN

## 2023-09-30 MED ORDER — NICOTINE 14 MG/24HR TD PT24: 14.0000 mg | MEDICATED_PATCH | TRANSDERMAL | 11 refills | Status: DC

## 2023-09-30 NOTE — ED Provider Notes (Signed)
 [facility] Provider Note    Event Date/Time   First MD Initiated Contact with Patient 09/30/23 0300     (approximate)   History   Sore Throat   HPI  [name] Young is a 38 y.o. female   Past medical history of smoker who presents emergency department with nasal congestion, sore throat, productive cough for the last 4 days.  Subjective fever/chills.  No chest pain.  No shortness of breath.  Continues smoke half a pack a day.  No history of asthma or COPD.    External Medical Documents Reviewed: Outpatient gynecology visit from April 2025      Physical Exam   Triage Vital Signs: ED Triage Vitals  Encounter Vitals Group     BP 09/30/23 0247 112/81     Systolic BP Percentile --      Diastolic BP Percentile --      Pulse Rate 09/30/23 0247 96     Resp 09/30/23 0247 18     Temp 09/30/23 0247 98.6 F (37 C)     Temp Source 09/30/23 0247 Oral     SpO2 09/30/23 0247 100 %     Weight 09/30/23 0247 188 lb (85.3 kg)     Height 09/30/23 0247 5\' 3"  (1.6 m)     Head Circumference --      Peak Flow --      Pain Score 09/30/23 0246 10     Pain Loc --      Pain Education --      Exclude from Growth Chart --     Most recent vital signs: Vitals:   09/30/23 0247  BP: 112/81  Pulse: 96  Resp: 18  Temp: 98.6 F (37 C)  SpO2: 100%    General: Awake, no distress.  CV:  Good peripheral perfusion.  Resp:  Normal effort.  Abd:  No distention.  Other:  Awake alert comfortable appearing speaking full sentences no respiratory distress.  Normal vital signs and afebrile with no hypoxemia and clear lungs to auscultation without focality or wheezing.  Posterior oropharynx appears erythematous with no obvious exudates or masses and uvula is midline.   ED Results / Procedures / Treatments   Labs (all labs ordered are listed, but only abnormal results are displayed) Labs Reviewed  GROUP A STREP BY PCR  RESP PANEL BY RT-PCR (RSV, FLU A&B, COVID)   RVPGX2     I ordered and reviewed the above labs they are notable for negative for group A strep    RADIOLOGY I independently reviewed and interpreted chest x-ray and I see no obvious focality or pneumothorax I also reviewed radiologist's formal read.   PROCEDURES:  Critical Care performed: No  Procedures   MEDICATIONS ORDERED IN ED: Medications  acetaminophen  (TYLENOL ) tablet 1,000 mg (has no administration in time range)  ibuprofen  (ADVIL ) tablet 600 mg (has no administration in time range)  dexamethasone  (DECADRON ) tablet 8 mg (has no administration in time range)     IMPRESSION / MDM / ASSESSMENT AND PLAN / ED COURSE  I reviewed the triage vital signs and the nursing notes.                                Patient's presentation is most consistent with acute presentation with potential threat to life or bodily function.  Differential diagnosis includes, but is not limited to, viral URI, bacterial pneumonia, viral pharyngitis, strep  pharyngitis, deep space neck infection or abscess considered but less likely, considered but less likely sepsis or meningitis  MDM:    4 days of viral URI symptoms in this otherwise well-appearing nontoxic-appearing patient.  Will check viral panel as well as a strep test and chest x-ray.  Give dexamethasone  as well as Tylenol /ibuprofen  for symptomatic relief.  Low clinical suspicion of bacterial pneumonia given no fever, no focality on lung exam, but if focality noted on chest x-ray with her smoking history of strep a is positive we will start on antibiotics, otherwise supportive care advised.  I considered admission however given her hemodynamic stability, nontoxic appearance, she looks well enough for outpatient management.  -- I spent 5 minutes counseling this patient on smoking cessation.  We spoke about the patient's current tobacco use, impact of smoking, assessed willingness to quit, methods for cessation including medical management  and nicotine  replacement therapy (which I prescribed to the patient) and advised follow-up with primary doctor to continue to address smoking cessation.        FINAL CLINICAL IMPRESSION(S) / ED DIAGNOSES   Final diagnoses:  Viral upper respiratory tract infection  Acute pharyngitis, unspecified etiology  Encounter for smoking cessation counseling     Rx / DC Orders   ED Discharge Orders          Ordered    nicotine  polacrilex (NICOTINE  MINI) 4 MG lozenge  As needed        09/30/23 0311    nicotine  (NICODERM CQ  - DOSED IN MG/24 HOURS) 14 mg/24hr patch  Every 24 hours        09/30/23 8657             Note:  This document was prepared using Dragon voice recognition software and may include unintentional dictation errors.    Buell Carmin, MD 09/30/23 [phone]

## 2023-09-30 NOTE — ED Triage Notes (Signed)
 Pt reports sore throat and productive cough since friday

## 2023-09-30 NOTE — Discharge Instructions (Addendum)
 Take acetaminophen 650 mg and ibuprofen 400 mg every 6 hours for pain.  Take with food.  Thank you for choosing [place] for your health care today!  Please see your primary doctor this week for a follow up appointment.   If you have any new, worsening, or unexpected symptoms call your doctor right away or come back to the emergency department for reevaluation.  It was my pleasure to care for you today.   Daneil Dan Modesto Charon, MD

## 2023-10-01 ENCOUNTER — Ambulatory Visit
Admission: RE | Admit: 2023-10-01 | Discharge: 2023-10-01 | Disposition: A | Discharge status: Home / Self Care | Payer: MEDICAID | Source: Ambulatory Visit | Attending: Obstetrics and Gynecology | Admitting: Obstetrics and Gynecology

## 2023-10-01 ENCOUNTER — Telehealth: Payer: Self-pay

## 2023-10-01 DIAGNOSIS — N971 Female infertility of tubal origin: Secondary | ICD-10-CM | POA: Diagnosis not present

## 2023-10-01 MED ORDER — IOHEXOL 300 MG/ML  SOLN
25.0000 mL | Freq: Once | INTRAMUSCULAR | Status: AC | PRN
Start: 2023-10-01 — End: 2023-10-01
  Administered 2023-10-01: 25 mL

## 2023-10-01 NOTE — Telephone Encounter (Signed)
 Pt called triage stating she has an HSG procedure at 12:00 pm today. She wants to know if her doctor can take her some pain medication, she has been researching online and it says its painful. Checked with Dr. Luster Salters CMA and she has stated he is gone for the AM and that he does not prescribe pain medication for these procedures. Also that none of his patients have complained of pain for procedure. Pt aware.

## 2023-10-01 NOTE — Procedures (Signed)
    Hysterosalpingogram Post-Procedure Note  Pre-procedure Diagnosis: Infertility  Post-operative Diagnosis: Same  Indications: Infertility  Procedure Details:   Consent: Informed consent was obtained. Risks of the procedure were discussed including: infection, bleeding, pain, and allergic reaction to dye.  A speculum was inserted into the patient's vagina.  The cervix was cleansed with Betadine and a single-toothed tenaculum was used to grasp the anterior lip of the cervix.  The cervical opening was cannulated per standard procedure.  Then under fluoroscopic guidance, water soluble contrast was injected in a retrograde fashion. The cavity of the uterus appeared to be normal without filling defect and normally shaped after filling. Spillage was not seen from either of the fallopian tubes.  X-ray imaging were captured. The cannula was then removed from the uterine cavity.  The tenaculum was removed from the cervix with good hemostasis noted, and the speculum was removed.  The patient tolerated the procedure well.    Findings:   The uterus fills normally, with no evidence of contour abnormality, filling defect, septum, mass, or bicornuate configuration.   Both fallopian tubes are occluded without normal spillage of contrast into the peritoneum.    Fluoroscopy time: 1 minute.   Complications: None     Condition: Stable  Plan: Patient to f/u in clinic in 1 week for further management of infertility.    Delice Felt, M.D. 10/01/2023 3:20 PM Luster Salters Gretel Leaven, MD

## 2023-10-07 ENCOUNTER — Emergency Department: Payer: MEDICAID

## 2023-10-07 ENCOUNTER — Other Ambulatory Visit: Payer: Self-pay

## 2023-10-07 ENCOUNTER — Telehealth: Payer: Self-pay

## 2023-10-07 ENCOUNTER — Emergency Department
Admission: EM | Admit: 2023-10-07 | Discharge: 2023-10-07 | Disposition: A | Discharge status: Home / Self Care | Payer: MEDICAID | Attending: Emergency Medicine | Admitting: Emergency Medicine

## 2023-10-07 DIAGNOSIS — N939 Abnormal uterine and vaginal bleeding, unspecified: Secondary | ICD-10-CM | POA: Insufficient documentation

## 2023-10-07 DIAGNOSIS — R059 Cough, unspecified: Secondary | ICD-10-CM | POA: Diagnosis not present

## 2023-10-07 DIAGNOSIS — R109 Unspecified abdominal pain: Secondary | ICD-10-CM | POA: Diagnosis not present

## 2023-10-07 LAB — RESP PANEL BY RT-PCR (RSV, FLU A&B, COVID)  RVPGX2
Influenza A by PCR: NEGATIVE
Influenza B by PCR: NEGATIVE
Resp Syncytial Virus by PCR: NEGATIVE
SARS Coronavirus 2 by RT PCR: NEGATIVE

## 2023-10-07 LAB — COMPREHENSIVE METABOLIC PANEL WITH GFR
ALT: 17 U/L (ref 0–44)
AST: 16 U/L (ref 15–41)
Albumin: 3.4 g/dL — ABNORMAL LOW (ref 3.5–5.0)
Alkaline Phosphatase: 49 U/L (ref 38–126)
Anion gap: 5 (ref 5–15)
BUN: 17 mg/dL (ref 6–20)
CO2: 24 mmol/L (ref 22–32)
Calcium: 8.4 mg/dL — ABNORMAL LOW (ref 8.9–10.3)
Chloride: 109 mmol/L (ref 98–111)
Creatinine, Ser: 0.81 mg/dL (ref 0.44–1.00)
GFR, Estimated: >60 mL/min (ref >60)
Glucose, Bld: 100 mg/dL — ABNORMAL HIGH (ref 70–99)
Potassium: 4.1 mmol/L (ref 3.5–5.1)
Sodium: 138 mmol/L (ref 135–145)
Total Bilirubin: 0.5 mg/dL (ref 0.0–1.2)
Total Protein: 6.5 g/dL (ref 6.5–8.1)

## 2023-10-07 LAB — POC URINE PREG, ED: Preg Test, Ur: NEGATIVE

## 2023-10-07 LAB — TYPE AND SCREEN
ABO/RH(D): O POS
Antibody Screen: NEGATIVE

## 2023-10-07 LAB — CBC
HCT: 32.8 % — ABNORMAL LOW (ref 36.0–46.0)
Hemoglobin: 10.7 g/dL — ABNORMAL LOW (ref 12.0–15.0)
MCH: 26.8 pg (ref 26.0–34.0)
MCHC: 32.6 g/dL (ref 30.0–36.0)
MCV: 82 fL (ref 80.0–100.0)
Platelets: 266 10*3/uL (ref 150–400)
RBC: 4 MIL/uL (ref 3.87–5.11)
RDW: 14.1 % (ref 11.5–15.5)
WBC: 9.8 10*3/uL (ref 4.0–10.5)
nRBC: 0 % (ref 0.0–0.2)

## 2023-10-07 LAB — HCG, QUANTITATIVE, PREGNANCY: hCG, Beta Chain, Quant, S: <1 m[IU]/mL (ref <5)

## 2023-10-07 NOTE — Discharge Instructions (Addendum)
 Your exam, labs, and ultrasound are normal and reassuring at this time.  No abnormal findings to your ultrasound of the uterus, ovaries, fallopian tubes.  No clear underlying cause for your abnormal uterine bleeding.  You should follow-up with your GYN provider for ongoing evaluation and management.  Return to the ED if needed.

## 2023-10-07 NOTE — ED Provider Notes (Signed)
 [facility] Emergency Department Provider Note     Event Date/Time   First MD Initiated Contact with Patient 10/07/23 1327     (approximate)   History   Vaginal Bleeding   HPI  [name] Young is a 38 y.o. female presents today with vaginal bleeding and abdominal cramping since having an HSG procedure on 10/01/23. Her LMP was May 18-25th. She reports that the vaginal blood is bright red with clots and has to change pads frequently. She has had intercourse since the procedure and reported only mild discomfort with intercourse.  Chart review reveals the patient had a postpartum bilateral tubal ligation in 2021.   She also reports no taste or smell that past 3 days with a productive cough. She reports otherwise, she does not feel sick and denies sore throat and runny nose.     Physical Exam   Triage Vital Signs: ED Triage Vitals  Encounter Vitals Group     BP 10/07/23 1248 123/80     Systolic BP Percentile --      Diastolic BP Percentile --      Pulse Rate 10/07/23 1248 84     Resp 10/07/23 1248 17     Temp 10/07/23 1248 98 F (36.7 C)     Temp src --      SpO2 10/07/23 1248 98 %     Weight --      Height --      Head Circumference --      Peak Flow --      Pain Score 10/07/23 1325 0     Pain Loc --      Pain Education --      Exclude from Growth Chart --     Most recent vital signs: Vitals:   10/07/23 1622 10/07/23 1647  BP: 116/82   Pulse: 78   Resp: 16   Temp:  98 F (36.7 C)  SpO2: 99%     General Awake, no distress.  NAD HEENT NCAT. PERRL. EOMI. No rhinorrhea. Mucous membranes are moist.  CV:  Good peripheral perfusion.  RESP:  Normal effort.  CTA ABD:  No distention.  Soft and nontender.  No CVA tenderness elicited GYN:  Declined by patient   ED Results / Procedures / Treatments   Labs (all labs ordered are listed, but only abnormal results are displayed) Labs Reviewed  CBC - Abnormal; Notable for the following  components:      Result Value   Hemoglobin 10.7 (*)    HCT 32.8 (*)    All other components within normal limits  COMPREHENSIVE METABOLIC PANEL WITH GFR - Abnormal; Notable for the following components:   Glucose, Bld 100 (*)    Calcium  8.4 (*)    Albumin 3.4 (*)    All other components within normal limits  RESP PANEL BY RT-PCR (RSV, FLU A&B, COVID)  RVPGX2  HCG, QUANTITATIVE, PREGNANCY  HCG, QUANTITATIVE, PREGNANCY  POC URINE PREG, ED  TYPE AND SCREEN   EKG   RADIOLOGY  I personally viewed and evaluated these images as part of my medical decision making, as well as reviewing the written report by the radiologist.  ED Provider Interpretation: No acute acute pelvic abnormalities noted.  US  PELVIC COMPLETE WITH TRANSVAGINAL Result Date: 10/07/2023 CLINICAL DATA:  Abnormal vaginal bleeding since hysterosalpingogram 5 days ago. EXAM: TRANSABDOMINAL AND TRANSVAGINAL ULTRASOUND OF PELVIS TECHNIQUE: Both transabdominal and transvaginal ultrasound examinations of the pelvis were performed. Transabdominal technique was  performed for global imaging of the pelvis including uterus, ovaries, adnexal regions, and pelvic cul-de-sac. It was necessary to proceed with endovaginal exam following the transabdominal exam to visualize the endometrium and ovaries. COMPARISON:  CT scan of May 07, 2023. FINDINGS: Uterus Measurements: 9.6 x 5.7 x 5.4 cm = volume: 154 mL. No fibroids or other mass visualized. Endometrium Thickness: 12 mm which is within normal limits. No focal abnormality visualized. Right ovary Measurements: 2.6 x 1.6 x 1.1 cm = volume: 2 mL. Normal appearance/no adnexal mass. Left ovary Not visualized. Other findings No abnormal free fluid. IMPRESSION: Left ovary is not visualized. No definite abnormality seen in the pelvis. Electronically Signed   By: Rosalene Colon M.D.   On: 10/07/2023 16:23    PROCEDURES:  Critical Care performed: No  Procedures   MEDICATIONS ORDERED IN  ED: Medications - No data to display   IMPRESSION / MDM / ASSESSMENT AND PLAN / ED COURSE  I reviewed the triage vital signs and the nursing notes.                              Differential diagnosis includes, but is not limited to, ovarian cyst, ovarian torsion, acute appendicitis, diverticulitis, urinary tract infection/pyelonephritis, bowel obstruction, colitis, renal colic, gastroenteritis, hernia, fibroids, endometriosis, pregnancy related pain including ectopic pregnancy, etc.  Patient's presentation is most consistent with acute complicated illness / injury requiring diagnostic workup.  Patient's diagnosis is consistent with abnormal uterine bleeding.  Patient presents with intermenstrual uterine bleeding after having a menstrual period 10 days prior.  She also had a HSC procedure 2 weeks ago.  Labs overall reassuring and unremarkable at this time and no signs of any critical anemia.  Patient is to follow up with her OB provider as discussed, as needed or otherwise directed. Patient is given ED precautions to return to the ED for any worsening or new symptoms.   FINAL CLINICAL IMPRESSION(S) / ED DIAGNOSES   Final diagnoses:  Abnormal uterine bleeding     Rx / DC Orders   ED Discharge Orders     None        Note:  This document was prepared using Dragon voice recognition software and may include unintentional dictation errors.    May Sparks, PA-C 10/07/23 1651    Bryson Carbine, MD 10/07/23 [phone]

## 2023-10-07 NOTE — ED Notes (Shared)
 Patient is a 38 year old female who presents today with vaginal bleeding and abdominal cramping since having an HSG procedure on 10/01/23. Her LMP was May 18-25th. She reports that the vaginal blood is bright red with clots and has to change pads frequently. She has had intercourse since the procedure and reported only mild discomfort with intercourse.   She also reports no taste or smell that past 3 days with a productive cough. She reports otherwise, she does not feel sick and denies sore throat and runny nose.

## 2023-10-07 NOTE — ED Triage Notes (Signed)
 Pt comes in via pov with complaints vaginal bleeding. Pt states that on 10/01/23 Hsg procedure done, and has been having bleeding and cramping ever since. Pt complains of excessive bleeding and needing to change her pad 4 times within an hour at work, and reports passing clots. PT states that the bleeding is still very heavy this morning. Pt also complains of not being able to taste or smell anything, and has had a cough for the past 4-5 days. Pt took tylenol  for pain at about 930 this morning with minimal relief. Pt is alert and oriented x4 with no signs of acute distress at this time.

## 2023-10-07 NOTE — Telephone Encounter (Signed)
 Patient states having abnormal bleeding starting on Date of onset: 10/02/23. Patient states the bleeding is heavy, reports soiling clothes and bleeding through multiple pads an hour.  Patient states none abdominal pain and cramping. Reports a loss of taste buds since the bleeding began. Recent HSG preformed on 10/01/23.  Recommended patient to go to the ED for evaluation.

## 2023-10-13 ENCOUNTER — Ambulatory Visit: Payer: Self-pay

## 2023-10-15 ENCOUNTER — Telehealth: Payer: MEDICAID | Admitting: Obstetrics and Gynecology

## 2023-10-15 ENCOUNTER — Encounter: Payer: Self-pay | Admitting: Obstetrics and Gynecology

## 2023-10-15 DIAGNOSIS — N971 Female infertility of tubal origin: Secondary | ICD-10-CM | POA: Diagnosis not present
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
## 2023-10-28 ENCOUNTER — Telehealth: Payer: MEDICAID | Admitting: Obstetrics and Gynecology

## 2023-10-28 DIAGNOSIS — N971 Female infertility of tubal origin: Secondary | ICD-10-CM
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
## 2024-01-25 ENCOUNTER — Ambulatory Visit (LOCAL_COMMUNITY_HEALTH_CENTER): Payer: Self-pay

## 2024-01-25 DIAGNOSIS — Z111 Encounter for screening for respiratory tuberculosis: Secondary | ICD-10-CM

## 2024-01-28 ENCOUNTER — Ambulatory Visit (LOCAL_COMMUNITY_HEALTH_CENTER): Payer: Self-pay

## 2024-01-28 DIAGNOSIS — Z111 Encounter for screening for respiratory tuberculosis: Secondary | ICD-10-CM

## 2024-01-28 LAB — TB SKIN TEST
Induration: 0 mm
TB Skin Test: NEGATIVE

## 2024-02-13 ENCOUNTER — Emergency Department

## 2024-02-13 ENCOUNTER — Other Ambulatory Visit: Payer: Self-pay

## 2024-02-13 ENCOUNTER — Emergency Department
Admission: EM | Admit: 2024-02-13 | Discharge: 2024-02-13 | Discharge status: Against Medical Advice | Attending: Emergency Medicine | Admitting: Emergency Medicine

## 2024-02-13 DIAGNOSIS — Z5321 Procedure and treatment not carried out due to patient leaving prior to being seen by health care provider: Secondary | ICD-10-CM | POA: Diagnosis not present

## 2024-02-13 DIAGNOSIS — R079 Chest pain, unspecified: Secondary | ICD-10-CM | POA: Diagnosis not present

## 2024-02-13 DIAGNOSIS — R059 Cough, unspecified: Secondary | ICD-10-CM | POA: Diagnosis present

## 2024-02-13 DIAGNOSIS — R091 Pleurisy: Secondary | ICD-10-CM | POA: Insufficient documentation

## 2024-02-13 DIAGNOSIS — R070 Pain in throat: Secondary | ICD-10-CM | POA: Insufficient documentation

## 2024-02-13 DIAGNOSIS — R439 Unspecified disturbances of smell and taste: Secondary | ICD-10-CM | POA: Diagnosis not present

## 2024-02-13 DIAGNOSIS — R0981 Nasal congestion: Secondary | ICD-10-CM | POA: Insufficient documentation

## 2024-02-13 LAB — RESP PANEL BY RT-PCR (RSV, FLU A&B, COVID)  RVPGX2
Influenza A by PCR: NEGATIVE
Influenza B by PCR: NEGATIVE
Resp Syncytial Virus by PCR: NEGATIVE
SARS Coronavirus 2 by RT PCR: NEGATIVE

## 2024-02-13 LAB — POC URINE PREG, ED: Preg Test, Ur: NEGATIVE

## 2024-02-13 LAB — GROUP A STREP BY PCR: Group A Strep by PCR: NOT DETECTED

## 2024-02-13 NOTE — ED Triage Notes (Signed)
 Patient C/O cough, congestion, throat and musculoskeletal chest pain that she describes as a burning when she coughs that began on Wednesday. Patient also endorses loss of taste and smell. Denies any fever or SOB.

## 2024-03-24 ENCOUNTER — Other Ambulatory Visit: Payer: Self-pay

## 2024-03-24 ENCOUNTER — Emergency Department
Admission: EM | Admit: 2024-03-24 | Discharge: 2024-03-24 | Disposition: A | Discharge status: Home / Self Care | Payer: Self-pay | Attending: Emergency Medicine | Admitting: Emergency Medicine

## 2024-03-24 ENCOUNTER — Emergency Department: Payer: Self-pay

## 2024-03-24 DIAGNOSIS — R55 Syncope and collapse: Secondary | ICD-10-CM | POA: Insufficient documentation

## 2024-03-24 DIAGNOSIS — R519 Headache, unspecified: Secondary | ICD-10-CM

## 2024-03-24 MED ORDER — METOCLOPRAMIDE HCL 5 MG/ML IJ SOLN
10.0000 mg | Freq: Once | INTRAMUSCULAR | Status: AC
  Administered 2024-03-24: 10 mg via INTRAVENOUS
  Filled 2024-03-24: qty 2

## 2024-03-24 MED ORDER — KETOROLAC TROMETHAMINE 15 MG/ML IJ SOLN
15.0000 mg | Freq: Once | INTRAMUSCULAR | Status: AC
  Administered 2024-03-24: 15 mg via INTRAVENOUS
  Filled 2024-03-24: qty 1

## 2024-03-24 MED ORDER — DEXAMETHASONE SOD PHOSPHATE PF 10 MG/ML IJ SOLN
10.0000 mg | Freq: Once | INTRAMUSCULAR | Status: AC
  Administered 2024-03-24: 10 mg via INTRAVENOUS

## 2024-03-24 MED ORDER — DIPHENHYDRAMINE HCL 50 MG/ML IJ SOLN
12.5000 mg | Freq: Once | INTRAMUSCULAR | Status: AC
  Administered 2024-03-24: 12.5 mg via INTRAVENOUS

## 2024-03-24 MED ORDER — DIPHENHYDRAMINE HCL 50 MG/ML IJ SOLN
12.5000 mg | Freq: Once | INTRAMUSCULAR | Status: DC
  Filled 2024-03-24: qty 1

## 2024-03-24 MED ORDER — BUTALBITAL-APAP-CAFFEINE 50-325-40 MG PO TABS: 1.0000 | ORAL_TABLET | Freq: Four times a day (QID) | ORAL | 0 refills | Status: DC | PRN

## 2024-03-24 MED ORDER — SODIUM CHLORIDE 0.9 % IV BOLUS
1000.0000 mL | Freq: Once | INTRAVENOUS | Status: AC
  Administered 2024-03-24: 1000 mL via INTRAVENOUS

## 2024-03-24 NOTE — Discharge Instructions (Signed)
 You were evaluated in the ED for a headache. Your evaluation did not reveal signs of a serious condition such as a brain bleed or skull fracture and were reassuring.   Consider these rest and recovery strategies at home:  - Avoid strenous activity , exercise or heavy lifting for a few days.  -Limit screen time and activities requiring intense focus.  - Gradually return to normal activities as symptoms improve   Take OTC Excedrin.  Apply ice/cold pack to the affected area 10-20 minutes every 2-3 hours for the first 24-48 hours to reduce pain and swelling. Avoid alcohol and caffeine.   Follow up with you PCP or neurologist in 1-2 weeks if symptoms persist or worsen.

## 2024-03-24 NOTE — ED Triage Notes (Signed)
 Patient ambulatory to triage with complaints of headache since this morning. States she has been having them more frequently lately. Endorses they come on gradually and worsen as the day goes. Tylenol  taken 1 hr PTA.

## 2024-03-24 NOTE — ED Provider Notes (Signed)
  [facility] Emergency Department Provider Note     Event Date/Time   First MD Initiated Contact with Patient 03/24/24 2044     (approximate)   History   Headache   HPI  [name] Young is a 38 y.o. female  presents to the ED for evaluation of a headache that comes and goes ongoing for 6 months localized to the front of her head.  She has tried Tylenol  with no relief.  Denies vision loss, jaw pain, nausea.  There is no pattern to his headache.  Denies injury or falls.  No recent illnesses or fever.  No history of migraines.     Physical Exam   Triage Vital Signs: ED Triage Vitals  Encounter Vitals Group     BP 03/24/24 2041 129/89     Girls Systolic BP Percentile --      Girls Diastolic BP Percentile --      Boys Systolic BP Percentile --      Boys Diastolic BP Percentile --      Pulse Rate 03/24/24 2041 78     Resp 03/24/24 2041 16     Temp 03/24/24 2041 98.5 F (36.9 C)     Temp Source 03/24/24 2041 Oral     SpO2 03/24/24 2041 98 %     Weight 03/24/24 2042 183 lb (83 kg)     Height 03/24/24 2042 5' 3 (1.6 m)     Head Circumference --      Peak Flow --      Pain Score 03/24/24 2042 10     Pain Loc --      Pain Education --      Exclude from Growth Chart --     Most recent vital signs: Vitals:   03/24/24 2041  BP: 129/89  Pulse: 78  Resp: 16  Temp: 98.5 F (36.9 C)  SpO2: 98%   General: Well appearing and comfortable. Alert and oriented. INAD.  Skin:  Warm, dry and intact. No rashes or lesions noted.     Head:  NCAT.  Eyes:  PERRLA. EOMI.  CV:  Good peripheral perfusion.  RESP:  Normal effort.  NEURO: Cranial nerves intact. No focal deficits. Speech is clear. Sensation and motor function intact. Gait is steady.   ED Results / Procedures / Treatments   Labs (all labs ordered are listed, but only abnormal results are displayed) Labs Reviewed - No data to display  No results found.  PROCEDURES:  Critical Care  performed: No  Procedures  MEDICATIONS ORDERED IN ED: Medications  sodium chloride  0.9 % bolus 1,000 mL (1,000 mLs Intravenous New Bag/Given 03/24/24 2139)  dexamethasone  (DECADRON ) injection 10 mg (10 mg Intravenous Given 03/24/24 2140)  metoCLOPramide  (REGLAN ) injection 10 mg (10 mg Intravenous Given 03/24/24 2140)  ketorolac  (TORADOL ) 15 MG/ML injection 15 mg (15 mg Intravenous Given 03/24/24 2140)  diphenhydrAMINE  (BENADRYL ) injection 12.5 mg (12.5 mg Intravenous Given 03/24/24 2148)    IMPRESSION / MDM / ASSESSMENT AND PLAN / ED COURSE  I reviewed the triage vital signs and the nursing notes.                               38 y.o. female presents to the emergency department for evaluation and treatment of headache. See HPI for further details.   Differential diagnosis includes, but is not limited to tension headache, migraine or migraine equivalent, idiopathic intracranial hypertension, and  non-specific headache.    Patient's presentation is most consistent with acute, uncomplicated illness.  Patient is alert and oriented.  She is hemodynamically stable and afebrile.  Physical exam findings are stated above.  Normal neuroexam.  No focal deficits or red flag signs to indicate imaging.  Given that patient's symptoms have been chronic over a 6 month period, I did offer CT imaging of her head.  The CT technician came to get the patient however the patient declined going to CT as she wanted to continue her migraine cocktail as it was providing relief and she did not want to get up or out of the position she was in.  With further discussion with the patient, as I have low suspicion of ICH or neoplasm, with shared decision making we opted to wait on CT scan and allow the migraine cocktail to work.  I presume patient will be in stable condition for discharge home and outpatient management once the medication is complete.  I have encouraged a close follow-up with her primary care provider.  Will  prescribe short course of Fioricet.  Patient is in stable condition for discharge home.  ED return precautions discussed.  FINAL CLINICAL IMPRESSION(S) / ED DIAGNOSES   Final diagnoses:  Bad headache   Rx / DC Orders   ED Discharge Orders     None      Note:  This document was prepared using Dragon voice recognition software and may include unintentional dictation errors.    Margrette, Gar Glance A, PA-C 03/24/24 2248    Viviann Pastor, MD 03/25/24 [phone]

## 2024-03-24 NOTE — ED Notes (Signed)
 Pt received discharge instructions but departed before discharge vitals could be obtained.

## 2024-03-25 ENCOUNTER — Ambulatory Visit: Payer: Self-pay

## 2024-03-25 NOTE — Telephone Encounter (Signed)
 FYI Only or Action Required?: FYI only for provider: appointment scheduled on Tuesday - NP .  Patient was last seen in primary care on not a [facility] pt.  Called Nurse Triage reporting Dizziness.Pt states she is having chronic headaches, increased dizziness, weakness and fatigue, and frequent urination.   Symptoms began several months ago.  Interventions attempted: Other: went to ED.  Symptoms are: unchanged.  Triage Disposition: See PCP When Office is Open (Within 3 Days)  Patient/caregiver understands and will follow disposition?: yes - Pt will go to ED or UC for care if needed. Seen last night at ED. Symptoms ongoing.                         Copied from CRM #[id number]. Topic: Clinical - Red Word Triage >> Mar 25, 2024  8:13 AM Amy B wrote: Red Word that prompted transfer to Nurse Triage: Headaches, sees spots >> Mar 25, 2024  8:33 AM Treva T wrote: [facility] that prompted transfer to Nurse Triage: Pt states she is having chronic headaches, increased dizziness, weakness and fatigue, and frequent urination.   Pt is requesting an appt to be seen today.   Reason for Disposition  [1] MODERATE dizziness (e.g., interferes with normal activities) AND [2] has been evaluated by doctor (or NP/PA) for this  Answer Assessment - Initial Assessment Questions 1. DESCRIPTION: Describe your dizziness.     Summer -lightheaded - makes her HA worse 4. SEVERITY: How bad is it?  Do you feel like you are going to faint? Can you stand and walk?     Can walk and stand 5. ONSET:  When did the dizziness begin?     Summertime 6. AGGRAVATING FACTORS: Does anything make it worse? (e.g., standing, change in head position)      7. HEART RATE: Can you tell me your heart rate? How many beats in 15 seconds?  (Note: Not all patients can do this.)        8. CAUSE: What do you think is causing the dizziness? (e.g., decreased fluids or food, diarrhea, emotional distress, heat  exposure, new medicine, sudden standing, vomiting; unknown)     Unsure - diabetes? 9. RECURRENT SYMPTOM: Have you had dizziness before? If Yes, ask: When was the last time? What happened that time?     ongoing 10. OTHER SYMPTOMS: Do you have any other symptoms? (e.g., fever, chest pain, vomiting, diarrhea, bleeding)       Headache, pre-diabetic. 11. PREGNANCY: Is there any chance you are pregnant? When was your last menstrual period?       no  Protocols used: Dizziness - Lightheadedness-A-AH

## 2024-03-25 NOTE — Telephone Encounter (Signed)
 Call disconnected on transfer from [facility]. This RN attempted to contact pt. No answer, left VM with call back number   Copied from CRM #[id number]. Topic: Clinical - Red Word Triage >> Mar 25, 2024  8:13 AM Amy B wrote: Red Word that prompted transfer to Nurse Triage: Headaches, sees spots

## 2024-03-27 ENCOUNTER — Other Ambulatory Visit: Payer: Self-pay

## 2024-03-27 ENCOUNTER — Emergency Department
Admission: EM | Admit: 2024-03-27 | Discharge: 2024-03-27 | Discharge status: Against Medical Advice | Payer: Self-pay | Attending: Emergency Medicine | Admitting: Emergency Medicine

## 2024-03-27 ENCOUNTER — Emergency Department: Payer: Self-pay

## 2024-03-27 DIAGNOSIS — Z5329 Procedure and treatment not carried out because of patient's decision for other reasons: Secondary | ICD-10-CM | POA: Insufficient documentation

## 2024-03-27 DIAGNOSIS — R519 Headache, unspecified: Secondary | ICD-10-CM | POA: Insufficient documentation

## 2024-03-27 DIAGNOSIS — F17200 Nicotine dependence, unspecified, uncomplicated: Secondary | ICD-10-CM | POA: Insufficient documentation

## 2024-03-27 NOTE — ED Provider Notes (Signed)
 [facility] Provider Note    Event Date/Time   First MD Initiated Contact with Patient 03/27/24 0801     (approximate)   History   Headache   HPI  [name] Young is a 38 y.o. female with a past medical history of bipolar disorder who presents today with request for Ambien .  Patient reports that she has been having headaches daily for the past several months across the front of her forehead.  She denies nausea or vomiting.  She denies visual changes.  She reports that she occasionally has light and sound sensitivity.  She reports that these headaches are keeping her awake at night and so she is requesting Ambien .  No neck pain.  She reports that she has been taking Fioricet without improvement.  Denies chance of pregnancy.  Patient Active Problem List   Diagnosis Date Noted   NSVD (normal spontaneous vaginal delivery) 12/09/2019   Supervision of high risk pregnancy in third trimester 01/12/2018   Anemia affecting pregnancy 10/05/2017   Bipolar disorder (HCC) 07/21/2016   Domestic problems 02/06/2016   Smoker 02/06/2016   Cocaine use disorder, moderate, dependence (HCC) 01/16/2016   Bipolar disorder, curr episode mixed, severe, with psychotic features (HCC) 01/15/2016          Physical Exam   Triage Vital Signs: ED Triage Vitals  Encounter Vitals Group     BP 03/27/24 0750 (!) 136/98     Girls Systolic BP Percentile --      Girls Diastolic BP Percentile --      Boys Systolic BP Percentile --      Boys Diastolic BP Percentile --      Pulse Rate 03/27/24 0750 72     Resp 03/27/24 0750 17     Temp 03/27/24 0750 98 F (36.7 C)     Temp src --      SpO2 03/27/24 0750 100 %     Weight 03/27/24 0748 183 lb (83 kg)     Height 03/27/24 0748 5' 3 (1.6 m)     Head Circumference --      Peak Flow --      Pain Score 03/27/24 0748 10     Pain Loc --      Pain Education --      Exclude from Growth Chart --     Most recent vital signs: Vitals:    03/27/24 0750 03/27/24 0807  BP: (!) 136/98   Pulse: 72   Resp: 17   Temp: 98 F (36.7 C)   SpO2: 100% 100%    Physical Exam Vitals and nursing note reviewed.  Constitutional:      General: Awake and alert. No acute distress.    Appearance: Normal appearance. The patient is normal weight.  HENT:     Head: Normocephalic and atraumatic.     Mouth: Mucous membranes are moist.  Eyes:     General: PERRL. Normal EOMs        Right eye: No discharge.        Left eye: No discharge.     Conjunctiva/sclera: Conjunctivae normal.  Cardiovascular:     Rate and Rhythm: Normal rate and regular rhythm.     Pulses: Normal pulses.  Pulmonary:     Effort: Pulmonary effort is normal. No respiratory distress.     Breath sounds: Normal breath sounds.  Abdominal:     Abdomen is soft. There is no abdominal tenderness. No rebound or guarding. No distention. Musculoskeletal:  General: No swelling. Normal range of motion.     Cervical back: Normal range of motion and neck supple.  Skin:    General: Skin is warm and dry.     Capillary Refill: Capillary refill takes less than 2 seconds.     Findings: No rash.  Neurological:     Mental Status: The patient is awake and alert.   Neurological: GCS 15 alert and oriented x3 Normal speech, no expressive or receptive aphasia or dysarthria Cranial nerves II through XII intact Normal visual fields 5 out of 5 strength in all 4 extremities with intact sensation throughout No extremity drift Normal finger-to-nose testing, no limb or truncal ataxia    ED Results / Procedures / Treatments   Labs (all labs ordered are listed, but only abnormal results are displayed) Labs Reviewed - No data to display   EKG     RADIOLOGY     PROCEDURES:  Critical Care performed:   Procedures   MEDICATIONS ORDERED IN ED: Medications - No data to display   IMPRESSION / MDM / ASSESSMENT AND PLAN / ED COURSE  I reviewed the triage vital signs and  the nursing notes.   Differential diagnosis includes, but is not limited to, tension headache, migraine, cluster headache, intracranial abnormality.  I reviewed the patient's chart.  Patient was seen on 03/24/2024 for same complaint.  Patient declined head CT during previous visit.  Patient is declining analgesia.  She does agree to head CT today.  She is requesting Ambien .  I asked her if she is having a hard time sleeping because of her headaches and patient said yes, therefore I advised that we evaluate for potential cause of headache and also treat her headache and that would help her to sleep.  Patient reports that she wants Ambien  only, but was agreeable to CT.  Patient eloped from the ER without CT head. She did not notify any staff prior to departure, myself included.  Patient's presentation is most consistent with acute complicated illness / injury requiring diagnostic workup.   Clinical Course as of 03/27/24 1410  Sun Mar 27, 2024  [id number] Patient has eloped from the emergency department, did not tell any staff before departure [JP]    Clinical Course User Index [JP] Alexei Doswell E, PA-C     FINAL CLINICAL IMPRESSION(S) / ED DIAGNOSES   Final diagnoses:  Acute nonintractable headache, unspecified headache type     Rx / DC Orders   ED Discharge Orders     None        Note:  This document was prepared using Dragon voice recognition software and may include unintentional dictation errors.   Tylique Aull E, PA-C 03/27/24 1410    Bradler, Evan K, MD 03/27/24 [phone]

## 2024-03-27 NOTE — ED Triage Notes (Addendum)
 Pt comes with chronic headaches and unable to sleep. Pt was seen here on the 20th for the same. Pt states they are worse. Pt states no relief with meds that were prescribed.

## 2024-03-27 NOTE — ED Notes (Signed)
 Pt not in room when CT came Provider aware

## 2024-03-29 ENCOUNTER — Ambulatory Visit: Payer: Self-pay | Admitting: Family Medicine

## 2024-03-31 ENCOUNTER — Other Ambulatory Visit: Payer: Self-pay | Admitting: Medical Genetics

## 2024-04-06 ENCOUNTER — Ambulatory Visit: Payer: Self-pay | Admitting: Family Medicine

## 2024-04-12 ENCOUNTER — Telehealth: Payer: Self-pay | Admitting: Family Medicine

## 2024-04-12 ENCOUNTER — Encounter: Payer: Self-pay | Admitting: Family Medicine

## 2024-04-12 ENCOUNTER — Ambulatory Visit: Payer: Self-pay | Admitting: Family Medicine

## 2024-04-12 VITALS — BP 107/83 | HR 76 | Resp 16 | Ht 63.0 in | Wt 190.3 lb

## 2024-04-12 DIAGNOSIS — R7303 Prediabetes: Secondary | ICD-10-CM

## 2024-04-12 DIAGNOSIS — Z7689 Persons encountering health services in other specified circumstances: Secondary | ICD-10-CM

## 2024-04-12 DIAGNOSIS — G8929 Other chronic pain: Secondary | ICD-10-CM

## 2024-04-12 DIAGNOSIS — F5101 Primary insomnia: Secondary | ICD-10-CM

## 2024-04-12 DIAGNOSIS — R350 Frequency of micturition: Secondary | ICD-10-CM

## 2024-04-12 DIAGNOSIS — G43709 Chronic migraine without aura, not intractable, without status migrainosus: Secondary | ICD-10-CM

## 2024-04-12 DIAGNOSIS — F419 Anxiety disorder, unspecified: Secondary | ICD-10-CM

## 2024-04-12 LAB — POCT URINALYSIS DIPSTICK
Bilirubin, UA: NEGATIVE
Glucose, UA: NEGATIVE
Leukocytes, UA: NEGATIVE
Nitrite, UA: NEGATIVE
Protein, UA: NEGATIVE
Spec Grav, UA: >=1.03 — AB (ref 1.010–1.025)
Urobilinogen, UA: 0.2 U/dL
pH, UA: 6.5 (ref 5.0–8.0)

## 2024-04-12 LAB — POCT GLYCOSYLATED HEMOGLOBIN (HGB A1C): Hemoglobin A1C: 5.8 % — AB (ref 4.0–5.6)

## 2024-04-12 MED ORDER — SUMATRIPTAN SUCCINATE 50 MG PO TABS: ORAL_TABLET | ORAL | 0 refills | Status: AC

## 2024-04-12 MED ORDER — METFORMIN HCL ER 500 MG PO TB24: 500.0000 mg | ORAL_TABLET | Freq: Every day | ORAL | 2 refills | Status: AC

## 2024-04-12 MED ORDER — QUETIAPINE FUMARATE 25 MG PO TABS: 25.0000 mg | ORAL_TABLET | Freq: Every day | ORAL | 0 refills | Status: DC

## 2024-04-12 NOTE — Patient Instructions (Signed)

## 2024-04-12 NOTE — Telephone Encounter (Signed)
 Patient made aware that ins. [employer] is out of network
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
## 2024-04-13 ENCOUNTER — Telehealth: Payer: Self-pay | Admitting: Family Medicine

## 2024-04-13 ENCOUNTER — Encounter: Payer: Self-pay | Admitting: Family Medicine

## 2024-04-13 NOTE — Telephone Encounter (Signed)
 Pt called and stated that she needs her seroquel  refiled she forgot to get it yesterday, please advise. Pt asked if you can up the dose for her.

## 2024-04-13 NOTE — Telephone Encounter (Signed)
 Copied from CRM #[id number]. Topic: Clinical - Medication Question >> Apr 12, 2024  5:17 PM Delon T wrote: Reason for CRM: SUMAtriptan  (IMITREX ) 50 MG tablet - calling about amount of the pill- and asking why there are no refills on QUEtiapine  (SEROQUEL ) 25 MG tablet - need to have refills

## 2024-04-14 ENCOUNTER — Other Ambulatory Visit: Payer: Self-pay

## 2024-04-14 ENCOUNTER — Telehealth: Payer: Self-pay | Admitting: Family Medicine

## 2024-04-14 LAB — URINE CULTURE

## 2024-04-14 NOTE — Telephone Encounter (Signed)
 Copied from CRM [id number]. Topic: Clinical - Medication Question >> Apr 13, 2024  2:33 PM Dedra B wrote: Reason for CRM: Pt called to follow up on why the QUEtiapine  (SEROQUEL ) 25 MG tablet and SUMAtriptan  (IMITREX ) 50 MG tablet sent to pharmacy yesterday does not have refills.

## 2024-04-19 ENCOUNTER — Ambulatory Visit: Payer: Self-pay | Admitting: Family Medicine

## 2024-04-25 ENCOUNTER — Other Ambulatory Visit: Payer: Self-pay | Admitting: Family Medicine

## 2024-04-25 DIAGNOSIS — F5101 Primary insomnia: Secondary | ICD-10-CM

## 2024-04-25 MED ORDER — QUETIAPINE FUMARATE 50 MG PO TABS: 50.0000 mg | ORAL_TABLET | Freq: Every day | ORAL | 2 refills | Status: AC

## 2024-05-24 ENCOUNTER — Ambulatory Visit: Payer: Self-pay | Admitting: Physician Assistant

## 2024-07-11 ENCOUNTER — Ambulatory Visit: Admitting: Family Medicine
